# Patient Record
Sex: Female | Born: 1955 | Race: Black or African American | Hispanic: No | Smoking: Current some day smoker
Health system: Southern US, Community
[De-identification: ages and names within clinical notes are randomized; demographics above are authoritative.]

## PROBLEM LIST (undated history)

## (undated) DIAGNOSIS — H3530 Unspecified macular degeneration: Secondary | ICD-10-CM

## (undated) DIAGNOSIS — M797 Fibromyalgia: Secondary | ICD-10-CM

## (undated) DIAGNOSIS — E119 Type 2 diabetes mellitus without complications: Secondary | ICD-10-CM

## (undated) DIAGNOSIS — Z8679 Personal history of other diseases of the circulatory system: Secondary | ICD-10-CM

## (undated) DIAGNOSIS — H269 Unspecified cataract: Secondary | ICD-10-CM

## (undated) DIAGNOSIS — E785 Hyperlipidemia, unspecified: Secondary | ICD-10-CM

## (undated) DIAGNOSIS — I10 Essential (primary) hypertension: Secondary | ICD-10-CM

## (undated) HISTORY — DX: Type 2 diabetes mellitus without complications: E11.9

## (undated) HISTORY — DX: Hyperlipidemia, unspecified: E78.5

## (undated) HISTORY — DX: Personal history of other diseases of the circulatory system: Z86.79

## (undated) HISTORY — DX: Essential (primary) hypertension: I10

## (undated) HISTORY — DX: Unspecified cataract: H26.9

## (undated) HISTORY — PX: PARTIAL HYSTERECTOMY: SHX80

## (undated) HISTORY — PX: CERVICAL FUSION: SHX112

## (undated) HISTORY — DX: Fibromyalgia: M79.7

## (undated) HISTORY — PX: ECTOPIC PREGNANCY SURGERY: SHX613

## (undated) HISTORY — DX: Unspecified macular degeneration: H35.30

## (undated) HISTORY — PX: LUMBAR FUSION: SHX111

---

## 2017-07-12 DIAGNOSIS — M545 Low back pain, unspecified: Secondary | ICD-10-CM | POA: Insufficient documentation

## 2018-12-25 DIAGNOSIS — M48061 Spinal stenosis, lumbar region without neurogenic claudication: Secondary | ICD-10-CM | POA: Insufficient documentation

## 2018-12-25 DIAGNOSIS — M4310 Spondylolisthesis, site unspecified: Secondary | ICD-10-CM | POA: Insufficient documentation

## 2019-02-17 DIAGNOSIS — Z981 Arthrodesis status: Secondary | ICD-10-CM | POA: Insufficient documentation

## 2019-05-15 ENCOUNTER — Other Ambulatory Visit: Payer: Self-pay | Admitting: Internal Medicine

## 2019-05-15 DIAGNOSIS — Z1231 Encounter for screening mammogram for malignant neoplasm of breast: Secondary | ICD-10-CM

## 2019-06-04 ENCOUNTER — Ambulatory Visit: Payer: Self-pay | Admitting: Cardiology

## 2019-06-05 ENCOUNTER — Other Ambulatory Visit: Payer: Self-pay

## 2019-06-05 ENCOUNTER — Ambulatory Visit (INDEPENDENT_AMBULATORY_CARE_PROVIDER_SITE_OTHER): Payer: Medicare HMO | Admitting: Cardiology

## 2019-06-05 ENCOUNTER — Encounter: Payer: Self-pay | Admitting: Cardiology

## 2019-06-05 VITALS — BP 152/79 | HR 74 | Temp 98.0°F | Ht 67.0 in | Wt 181.0 lb

## 2019-06-05 DIAGNOSIS — E782 Mixed hyperlipidemia: Secondary | ICD-10-CM

## 2019-06-05 DIAGNOSIS — I4891 Unspecified atrial fibrillation: Secondary | ICD-10-CM

## 2019-06-05 DIAGNOSIS — Z720 Tobacco use: Secondary | ICD-10-CM

## 2019-06-05 DIAGNOSIS — I10 Essential (primary) hypertension: Secondary | ICD-10-CM

## 2019-06-05 NOTE — Progress Notes (Signed)
Primary Physician/Referring:  Sueanne Margarita, DO  Patient ID: Brittany Carroll, female    DOB: December 23, 1955, 64 y.o.   MRN: [id number]  Chief Complaint  Patient presents with  . Atrial Fibrillation  . New Patient (Initial Visit)   HPI:    Brittany Carroll  is a 64 y.o. Female patient who recently moved to [place] from [place], [place], with history of atrial fibrillation S/P Cardioversion in 2008 without recurrence, hypertension, mixed hyperlipidemia, diabetes mellitus, tobacco use, hyperthyroidism previously treated with methimazole, fibromyalgia and chronic pain referred to me to establish cardiac care in view or remote A. Fib.   Due to chronic back pain, spinal stenosis, she has not been able to exercise regularly and walks with the help of a cane.  She is chronically in pain.  No specific symptoms of dyspnea, chest pain or palpitations today.  States that she has had a stress test about 4-5 years ago and told to be normal.  Past Medical History:  Diagnosis Date  . Diabetes mellitus without complication ([place])   . Fibromyalgia   . History of atrial fibrillation without current medication    2008 in [place] had cardioversion  . Hyperlipidemia   . Hypertension    Past Surgical History:  Procedure Laterality Date  . CERVICAL FUSION    . ECTOPIC PREGNANCY SURGERY    . LUMBAR FUSION    . PARTIAL HYSTERECTOMY     Social History   Tobacco Use  . Smoking status: Current Every Day Smoker    Packs/day: 1.00    Years: 35.00    Pack years: 35.00    Types: Cigarettes  . Smokeless tobacco: Never Used  . Tobacco comment: 1 PACK EVERY 2 DAYS  Substance Use Topics  . Alcohol use: Yes   ROS  Review of Systems  Constitution: Positive for malaise/fatigue. Negative for weight gain.  Cardiovascular: Negative for dyspnea on exertion, leg swelling and syncope.  Respiratory: Negative for hemoptysis.   Endocrine: Negative for cold intolerance.  Hematologic/Lymphatic: Does not bruise/bleed easily.   Musculoskeletal: Positive for arthritis, back pain, muscle cramps and neck pain.  Gastrointestinal: Negative for hematochezia and melena.  Neurological: Negative for headaches and light-headedness.   Objective  Blood pressure (!) 152/79, pulse 74, temperature 98 F (36.7 C), height 5' 7"  (1.702 m), weight 181 lb (82.1 kg), SpO2 97 %.  Vitals with BMI 06/05/2019  Height 5' 7"   Weight 181 lbs  BMI 67.89  Systolic 381  Diastolic 79  Pulse 74     Physical Exam  Constitutional: She appears well-developed and well-nourished.  Neck: No thyromegaly present.  Cardiovascular: Normal rate, regular rhythm, normal heart sounds and intact distal pulses. Exam reveals no gallop.  No murmur heard. No leg edema, no JVD.  Pulmonary/Chest: Effort normal and breath sounds normal.  Abdominal: Soft. Bowel sounds are normal.  Musculoskeletal:     Cervical back: Neck supple.  Skin: Skin is warm and dry.   Laboratory examination:   No results for input(s): NA, K, CL, CO2, GLUCOSE, BUN, CREATININE, CALCIUM, GFRNONAA, GFRAA in the last 8760 hours. CrCl cannot be calculated (No successful lab value found.).  No flowsheet data found. No flowsheet data found. Lipid Panel  No results found for: CHOL, TRIG, HDL, CHOLHDL, VLDL, LDLCALC, LDLDIRECT HEMOGLOBIN A1C No results found for: HGBA1C, MPG TSH No results for input(s): TSH in the last 8760 hours.  Labs 05/08/2019: Serum glucose 157 mg, BUN 11, creatinine 0.8, eGFR >72 mL.  Potassium 4.8.  CMP normal.  CBC normal with minimal microcytic index.  Total cholesterol 188, triglycerides 172, HDL 57, LDL 97.  Non-HDL cholesterol 131.  TSH normal.  Vitamin D mildly reduced at 28.5.  A1c 6.1%.    Medications and allergies   Allergies  Allergen Reactions  . Lisinopril Hives     Current Outpatient Medications  Medication Instructions  . amLODipine (NORVASC) 10 MG tablet 1 tablet, Oral, Daily  . aspirin 325 mg, Oral, Daily  . cholecalciferol (VITAMIN  D3) 1,000 Units, Oral, Daily  . esomeprazole (NEXIUM) 40 mg, Oral, Daily  . gabapentin (NEURONTIN) 800 mg, Oral, 3 times daily  . Insulin Glargine (TOUJEO MAX SOLOSTAR [place]) 15 Units, Subcutaneous, Daily  . insulin lispro protamine-lispro (HUMALOG 50/50 MIX) (50-50) 100 UNIT/ML SUSP injection 5 Units, Subcutaneous, 2 times daily before meals  . losartan (COZAAR) 100 mg, Oral, Daily  . Magnesium 100 MG CAPS 2 capsules, Oral, Daily  . methimazole (TAPAZOLE) 10 mg, Oral, Daily  . morphine (MS CONTIN) 15 MG 12 hr tablet 1 tablet, Oral, 2 times daily  . Oxycodone HCl 10 mg, Oral, 4 times daily  . rosuvastatin (CRESTOR) 10 MG tablet 1 tablet, Oral, Daily  . sitaGLIPtin-metformin (JANUMET) 50-1000 MG tablet 1 tablet, Oral, 2 times daily    Radiology:  No results found.  Cardiac Studies:   None available  Assessment     ICD-10-CM   1. Atrial fibrillation, unspecified type (HCC)  I48.91 EKG 12-Lead    CANCELED: EKG 12-Lead   CHA2DS2-VASc Score is 3.  Yearly risk of stroke: 3.2% (F, HTN, DM).    2. Primary hypertension  I10   3. Mixed hyperlipidemia  E78.2   4. Tobacco use  Z72.0     EKG 06/05/2019: Normal sinus rhythm at rate of 79 bpm, normal axis.  Poor R-wave progression, cannot exclude anteroseptal infarct old, probably normal variant.  No evidence of ischemia,  Single PAC.    Recommendations:  No orders of the defined types were placed in this encounter.  Brittany Carroll  is a 64 y.o. AAF patient who recently moved to [place] from [place], [place], with history of atrial fibrillation S/P Cardioversion in 2008 without recurrence, hypertension, mixed hyperlipidemia, diabetes mellitus, tobacco use, hyperthyroidism previously treated with methimazole, fibromyalgia and chronic pain referred to me to establish cardiac care in view or remote A. Fib.   Her blood pressure slightly elevated today however patient is in extreme pain in her back and also states that it is usually well-controlled.  She  is on appropriate medical therapy with this.  I reviewed her labs, lipids are elevated and uncontrolled. Crestor dose increased recently by her PCP appropriately. Discussed regarding DASH diet and salt restriction.   Otherwise from cardiac standpoint physical examination was normal including vascular exam, EKG is also normal.  No indication ventricle ablation in view of absent documented atrial fibrillation and remote cardioversion in 2008.  I will see her back on an annual basis.I have again discussed smoking cessation.  Adrian Prows, MD, [facility] 06/05/2019, 2:11 PM [facility]. [place] Office: [phone]

## 2019-06-30 NOTE — Progress Notes (Signed)
[facility] - Clinic Note  07/13/2019     CHIEF COMPLAINT Patient presents for Retina Evaluation   HISTORY OF PRESENT ILLNESS: Brittany Carroll is a 64 y.o. female who presents to the clinic today for:   HPI    Retina Evaluation    In both eyes.  I, the attending physician,  performed the HPI with the patient and updated documentation appropriately.          Comments    Patient here for retina evaluation. Referred by Dr. Thornell Mule. Patient states vision doing ok. Gets a lot of dry eyes. Uses Restasis BID OU. Sometimes gets a stabbing pain in eye. Not all the time. Only on occasion.       Last edited by Rennis Chris, MD on 07/13/2019  1:49 PM. (History)    pt states she moved here from [place], [place] in November and her PCP has sent her here for a DM exam, pt states she has had eye exams in the past, but no problems with her eyes except DES for which she uses Restasis, pt states she also had high blood pressure and takes medication for thyroid  Referring physician: Charlane Ferretti, DO [address] [place],  [place] [address]  HISTORICAL INFORMATION:   Selected notes from the MEDICAL RECORD NUMBER Referred by PCP for DM exam   CURRENT MEDICATIONS: No current outpatient medications on file. (Ophthalmic Drugs)   No current facility-administered medications for this visit. (Ophthalmic Drugs)   Current Outpatient Medications (Other)  Medication Sig  . amLODipine (NORVASC) 10 MG tablet Take 1 tablet by mouth daily.  Marland Kitchen aspirin 325 MG tablet Take 325 mg by mouth daily.  . cholecalciferol (VITAMIN D3) 25 MCG (1000 UT) tablet Take 1,000 Units by mouth daily.  Marland Kitchen esomeprazole (NEXIUM) 40 MG capsule Take 40 mg by mouth daily at 12 noon.  . gabapentin (NEURONTIN) 800 MG tablet Take 800 mg by mouth 3 (three) times daily.  . Insulin Glargine (TOUJEO MAX SOLOSTAR [place]) Inject 15 Units into the skin daily.  . insulin lispro protamine-lispro (HUMALOG 50/50 MIX) (50-50) 100  UNIT/ML SUSP injection Inject 5 Units into the skin 2 (two) times daily before a meal.  . losartan (COZAAR) 100 MG tablet Take 100 mg by mouth daily.  . Magnesium 100 MG CAPS Take 2 capsules by mouth daily.  . methimazole (TAPAZOLE) 10 MG tablet Take 10 mg by mouth daily.  Marland Kitchen morphine (MS CONTIN) 15 MG 12 hr tablet Take 1 tablet by mouth 2 (two) times daily.  . Oxycodone HCl 10 MG TABS Take 10 mg by mouth 4 (four) times daily.  . rosuvastatin (CRESTOR) 10 MG tablet Take 1 tablet by mouth daily.  . sitaGLIPtin-metformin (JANUMET) 50-1000 MG tablet Take 1 tablet by mouth 2 (two) times daily.   No current facility-administered medications for this visit. (Other)      REVIEW OF SYSTEMS: ROS    Positive for: Endocrine, Cardiovascular   Last edited by Laddie Aquas, COA on 07/13/2019  1:48 PM. (History)       ALLERGIES Allergies  Allergen Reactions  . Lisinopril Hives    PAST MEDICAL HISTORY Past Medical History:  Diagnosis Date  . Diabetes mellitus without complication (HCC)   . Fibromyalgia   . History of atrial fibrillation without current medication    2008 in [place] had cardioversion  . Hyperlipidemia   . Hypertension    Past Surgical History:  Procedure Laterality Date  .  CERVICAL FUSION    . ECTOPIC PREGNANCY SURGERY    . LUMBAR FUSION    . PARTIAL HYSTERECTOMY      FAMILY HISTORY Family History  Problem Relation Age of Onset  . Atrial fibrillation Mother   . Hypertension Mother   . Hyperlipidemia Mother   . Leukemia Father   . Diabetes Brother   . Hypertension Brother   . Hyperlipidemia Brother   . Diabetes Brother   . Hyperlipidemia Brother   . Hypertension Brother     SOCIAL HISTORY Social History   Tobacco Use  . Smoking status: Current Every Day Smoker    Packs/day: 1.00    Years: 35.00    Pack years: 35.00    Types: Cigarettes  . Smokeless tobacco: Never Used  . Tobacco comment: 1 PACK EVERY 2 DAYS  Substance Use Topics  . Alcohol use: Yes   . Drug use: Not on file         OPHTHALMIC EXAM:  Base Eye Exam    Visual Acuity (Snellen - Linear)      Right Left   Dist [place] 20/20 -2 20/20 -2  Usually wears cl. Forgot them today.        Tonometry (Tonopen, 2:04 PM)      Right Left   Pressure 12 10       Pupils      Dark Light Shape React APD   Right 4 3 Round Brisk None   Left 4 3 Round Brisk None       Visual Fields (Counting fingers)      Left Right    Full Full       Extraocular Movement      Right Left    Full, Ortho Full, Ortho       Neuro/Psych    Oriented x3: Yes   Mood/Affect: Normal        Slit Lamp and Fundus Exam    Slit Lamp Exam      Right Left   Lids/Lashes Dermatochalasis - upper lid Dermatochalasis - upper lid   Conjunctiva/Sclera Mild Melanosis, white and clear Mild Melanosis, white and clear   Cornea Arcus, 2-3+ Punctate epithelial erosions Arcus, 2-3+ Punctate epithelial erosions   Anterior Chamber Deep and quiet Deep and quiet   Iris Round and dilated, No NVI Round and dilated, No NVI   Lens 2+ Nuclear sclerosis, 2-3+ Cortical cataract 2-3+ Nuclear sclerosis, 2-3+ Cortical cataract   Vitreous Mild Vitreous syneresis Mild Vitreous syneresis       Fundus Exam      Right Left   Disc Pink and Sharp Pink and Sharp   C/D Ratio 0.3 0.3   Macula Flat, Good foveal reflex, mild Retinal pigment epithelial mottling, No heme or edema Flat, Good foveal reflex, mild Retinal pigment epithelial mottling and clumping, No heme or edema   Vessels Mild Vascular attenuation, mild Tortuousity Mild Vascular attenuation, mild Tortuousity   Periphery Attached    Attached           Refraction    Manifest Refraction      Sphere Cylinder Dist VA   Right [place] Sphere 20/20   Left -0.75 Sphere 20/20          IMAGING AND PROCEDURES  Imaging and Procedures for @TODAY @  OCT, Retina - OU - Both Eyes       Right Eye Quality was good. Central Foveal Thickness: 235. Progression has no prior data.  Findings include normal foveal contour,  no IRF, no SRF.   Left Eye Quality was good. Central Foveal Thickness: 235. Progression has no prior data. Findings include normal foveal contour, no IRF, no SRF.   Notes *Images captured and stored on drive  Diagnosis / Impression:  NFP, no IRF/SRF OU No DME OU  Clinical management:  See below  Abbreviations: NFP - Normal foveal profile. CME - cystoid macular edema. PED - pigment epithelial detachment. IRF - intraretinal fluid. SRF - subretinal fluid. EZ - ellipsoid zone. ERM - epiretinal membrane. ORA - outer retinal atrophy. ORT - outer retinal tubulation. SRHM - subretinal hyper-reflective material                 ASSESSMENT/PLAN:    ICD-10-CM   1. Diabetes mellitus type 2 without retinopathy (HCC)  E11.9   2. Retinal edema  H35.81 OCT, Retina - OU - Both Eyes  3. Essential hypertension  I10   4. Hypertensive retinopathy of both eyes  H35.033   5. Combined forms of age-related cataract of both eyes  H25.813     1,2. 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    - The need for close monitoring of blood glucose, blood pressure, and serum lipids, avoiding cigarette or any type of tobacco, and the need for long term follow up was also discussed with patient.  - f/u in 1 year, sooner prn  3,4. Hypertensive retinopathy OU  - discussed importance of tight BP control  - monitor  5. 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  - discussed diagnosis and progression  - not yet visually significant  - monitor for now  - pt to establish primary eye care with Optometry or Ophthalmology -- offered to help w/ referral as needed   Ophthalmic Meds Ordered this visit:  No orders of the defined types were  placed in this encounter.      Return in about 1 year (around 07/12/2020) for d/u DM exam, DFE, OCT.  There are no Patient Instructions on file for this visit.   Explained the diagnoses, plan, and follow up with the patient and they expressed understanding.  Patient expressed understanding of the importance of proper follow up care.   This document serves as a record of services personally performed by Karie Chimera, MD, PhD. It was created on their behalf by Herby Abraham, COA, a certified ophthalmic assistant. The creation of this record is the provider's dictation and/or activities during the visit.    Electronically signed by: Herby Abraham, COA @TODAY @ 4:41 PM   This document serves as a record of services personally performed by , MD, PhD. It was created on their behalf by Karie Chimera, OA, an ophthalmic assistant. The creation of this record is the provider's dictation and/or activities during the visit.    Electronically signed by: Laurian Brim, OA 02.15.2021 4:41 PM    02.17.2021, M.D., Ph.D. Diseases & Surgery of the Retina and Vitreous [facility]  I have reviewed the above documentation for accuracy and completeness, and I agree with the above. [facility], M.D., Ph.D. 07/13/19 4:41 PM    Abbreviations: M myopia (nearsighted); A astigmatism; H hyperopia (farsighted); P presbyopia; Mrx spectacle prescription;  CTL contact lenses; OD right eye; OS left eye;  OU both eyes  XT exotropia; ET esotropia; PEK punctate epithelial keratitis; PEE punctate epithelial erosions; DES dry eye syndrome; MGD meibomian gland dysfunction; ATs artificial tears; PFAT's preservative free artificial tears; [place] nuclear sclerotic cataract; PSC posterior subcapsular cataract; ERM epi-retinal membrane; PVD posterior vitreous detachment; RD retinal detachment; DM diabetes mellitus; DR diabetic retinopathy; NPDR non-proliferative diabetic retinopathy; PDR  proliferative diabetic retinopathy; CSME clinically significant macular edema; DME diabetic macular edema; dbh dot blot hemorrhages; CWS cotton wool spot; POAG primary open angle glaucoma; C/D cup-to-disc ratio; HVF humphrey visual field; GVF goldmann visual field; OCT optical coherence tomography; IOP intraocular pressure; BRVO Branch retinal vein occlusion; CRVO central retinal vein occlusion; CRAO central retinal artery occlusion; BRAO branch retinal artery occlusion; RT retinal tear; SB scleral buckle; PPV pars plana vitrectomy; VH Vitreous hemorrhage; PRP panretinal laser photocoagulation; IVK intravitreal kenalog; VMT vitreomacular traction; MH Macular hole;  NVD neovascularization of the disc; NVE neovascularization elsewhere; AREDS age related eye disease study; ARMD age related macular degeneration; POAG primary open angle glaucoma; EBMD epithelial/anterior basement membrane dystrophy; ACIOL anterior chamber intraocular lens; IOL intraocular lens; PCIOL posterior chamber intraocular lens; Phaco/IOL phacoemulsification with intraocular lens placement; [place] photorefractive keratectomy; LASIK laser assisted in situ keratomileusis; HTN hypertension; DM diabetes mellitus; COPD chronic obstructive pulmonary disease

## 2019-07-02 ENCOUNTER — Other Ambulatory Visit: Payer: Self-pay | Admitting: Family Medicine

## 2019-07-02 ENCOUNTER — Other Ambulatory Visit: Payer: Self-pay

## 2019-07-02 ENCOUNTER — Ambulatory Visit
Admission: RE | Admit: 2019-07-02 | Discharge: 2019-07-02 | Disposition: A | Discharge status: Home / Self Care | Payer: PRIVATE HEALTH INSURANCE | Source: Ambulatory Visit | Attending: Internal Medicine | Admitting: Internal Medicine

## 2019-07-02 DIAGNOSIS — Z1231 Encounter for screening mammogram for malignant neoplasm of breast: Secondary | ICD-10-CM

## 2019-07-07 ENCOUNTER — Other Ambulatory Visit: Payer: Self-pay | Admitting: Family Medicine

## 2019-07-07 DIAGNOSIS — R928 Other abnormal and inconclusive findings on diagnostic imaging of breast: Secondary | ICD-10-CM

## 2019-07-13 ENCOUNTER — Encounter (INDEPENDENT_AMBULATORY_CARE_PROVIDER_SITE_OTHER): Payer: Self-pay | Admitting: Ophthalmology

## 2019-07-13 ENCOUNTER — Other Ambulatory Visit: Payer: Self-pay

## 2019-07-13 ENCOUNTER — Ambulatory Visit (INDEPENDENT_AMBULATORY_CARE_PROVIDER_SITE_OTHER): Payer: Medicare HMO | Admitting: Ophthalmology

## 2019-07-13 DIAGNOSIS — H3581 Retinal edema: Secondary | ICD-10-CM

## 2019-07-13 DIAGNOSIS — I10 Essential (primary) hypertension: Secondary | ICD-10-CM

## 2019-07-13 DIAGNOSIS — E119 Type 2 diabetes mellitus without complications: Secondary | ICD-10-CM | POA: Diagnosis not present

## 2019-07-13 DIAGNOSIS — H35033 Hypertensive retinopathy, bilateral: Secondary | ICD-10-CM | POA: Diagnosis not present

## 2019-07-13 DIAGNOSIS — H25813 Combined forms of age-related cataract, bilateral: Secondary | ICD-10-CM

## 2019-07-21 ENCOUNTER — Other Ambulatory Visit: Payer: PRIVATE HEALTH INSURANCE

## 2019-07-22 ENCOUNTER — Ambulatory Visit
Admission: RE | Admit: 2019-07-22 | Discharge: 2019-07-22 | Disposition: A | Discharge status: Home / Self Care | Payer: Medicare HMO | Source: Ambulatory Visit | Attending: Family Medicine | Admitting: Family Medicine

## 2019-07-22 ENCOUNTER — Ambulatory Visit
Admission: RE | Admit: 2019-07-22 | Discharge: 2019-07-22 | Disposition: A | Discharge status: Home / Self Care | Payer: PRIVATE HEALTH INSURANCE | Source: Ambulatory Visit | Attending: Family Medicine | Admitting: Family Medicine

## 2019-07-22 ENCOUNTER — Other Ambulatory Visit: Payer: Self-pay

## 2019-07-22 ENCOUNTER — Other Ambulatory Visit: Payer: Self-pay | Admitting: Family Medicine

## 2019-07-22 DIAGNOSIS — N6310 Unspecified lump in the right breast, unspecified quadrant: Secondary | ICD-10-CM

## 2019-07-22 DIAGNOSIS — R928 Other abnormal and inconclusive findings on diagnostic imaging of breast: Secondary | ICD-10-CM

## 2019-08-28 ENCOUNTER — Other Ambulatory Visit: Payer: Self-pay | Admitting: Family Medicine

## 2019-08-28 DIAGNOSIS — R202 Paresthesia of skin: Secondary | ICD-10-CM

## 2019-09-18 ENCOUNTER — Other Ambulatory Visit: Payer: Self-pay | Admitting: Family Medicine

## 2019-09-18 DIAGNOSIS — N6310 Unspecified lump in the right breast, unspecified quadrant: Secondary | ICD-10-CM

## 2019-09-26 ENCOUNTER — Other Ambulatory Visit: Payer: PRIVATE HEALTH INSURANCE

## 2019-10-23 ENCOUNTER — Other Ambulatory Visit: Payer: PRIVATE HEALTH INSURANCE

## 2019-11-25 ENCOUNTER — Ambulatory Visit
Admission: RE | Admit: 2019-11-25 | Discharge: 2019-11-25 | Disposition: A | Discharge status: Home / Self Care | Payer: Medicare HMO | Source: Ambulatory Visit | Attending: Family Medicine | Admitting: Family Medicine

## 2019-11-25 ENCOUNTER — Other Ambulatory Visit: Payer: Self-pay

## 2019-11-25 DIAGNOSIS — R202 Paresthesia of skin: Secondary | ICD-10-CM

## 2020-01-20 ENCOUNTER — Other Ambulatory Visit: Payer: Self-pay | Admitting: Family Medicine

## 2020-01-20 ENCOUNTER — Other Ambulatory Visit: Payer: Self-pay

## 2020-01-20 ENCOUNTER — Ambulatory Visit
Admission: RE | Admit: 2020-01-20 | Discharge: 2020-01-20 | Disposition: A | Discharge status: Home / Self Care | Payer: PRIVATE HEALTH INSURANCE | Source: Ambulatory Visit | Attending: Family Medicine | Admitting: Family Medicine

## 2020-01-20 DIAGNOSIS — N6310 Unspecified lump in the right breast, unspecified quadrant: Secondary | ICD-10-CM

## 2020-02-24 ENCOUNTER — Ambulatory Visit
Admission: RE | Admit: 2020-02-24 | Discharge: 2020-02-24 | Disposition: A | Discharge status: Home / Self Care | Payer: Medicare HMO | Source: Ambulatory Visit | Attending: Student | Admitting: Student

## 2020-02-24 ENCOUNTER — Other Ambulatory Visit: Payer: Self-pay | Admitting: Student

## 2020-02-24 ENCOUNTER — Other Ambulatory Visit: Payer: Self-pay

## 2020-02-24 DIAGNOSIS — F17210 Nicotine dependence, cigarettes, uncomplicated: Secondary | ICD-10-CM

## 2020-03-02 ENCOUNTER — Other Ambulatory Visit (HOSPITAL_COMMUNITY): Payer: Self-pay | Admitting: Student

## 2020-03-02 DIAGNOSIS — I10 Essential (primary) hypertension: Secondary | ICD-10-CM

## 2020-03-23 ENCOUNTER — Ambulatory Visit (HOSPITAL_COMMUNITY): Payer: Medicare HMO | Attending: Cardiovascular Disease

## 2020-03-23 ENCOUNTER — Other Ambulatory Visit: Payer: Self-pay

## 2020-03-23 DIAGNOSIS — I10 Essential (primary) hypertension: Secondary | ICD-10-CM | POA: Diagnosis not present

## 2020-03-23 LAB — ECHOCARDIOGRAM COMPLETE
Area-P 1/2: 3.87 cm2
S' Lateral: 2.2 cm

## 2020-04-05 ENCOUNTER — Other Ambulatory Visit: Payer: Self-pay | Admitting: General Practice

## 2020-04-05 DIAGNOSIS — F17210 Nicotine dependence, cigarettes, uncomplicated: Secondary | ICD-10-CM

## 2020-04-25 ENCOUNTER — Ambulatory Visit: Payer: Medicare HMO

## 2020-04-25 ENCOUNTER — Ambulatory Visit
Admission: RE | Admit: 2020-04-25 | Discharge: 2020-04-25 | Disposition: A | Discharge status: Home / Self Care | Payer: Medicare HMO | Source: Ambulatory Visit | Attending: General Practice | Admitting: General Practice

## 2020-04-25 DIAGNOSIS — F17210 Nicotine dependence, cigarettes, uncomplicated: Secondary | ICD-10-CM

## 2020-06-03 ENCOUNTER — Encounter: Payer: Self-pay | Admitting: Cardiology

## 2020-06-03 ENCOUNTER — Other Ambulatory Visit: Payer: Self-pay

## 2020-06-03 ENCOUNTER — Ambulatory Visit: Payer: Medicare HMO | Admitting: Cardiology

## 2020-06-03 VITALS — BP 150/94 | HR 100 | Resp 16 | Ht 67.0 in | Wt 211.8 lb

## 2020-06-03 DIAGNOSIS — Z720 Tobacco use: Secondary | ICD-10-CM

## 2020-06-03 DIAGNOSIS — I10 Essential (primary) hypertension: Secondary | ICD-10-CM

## 2020-06-03 MED ORDER — METOPROLOL SUCCINATE ER 50 MG PO TB24: 50.0000 mg | ORAL_TABLET | Freq: Every evening | ORAL | 2 refills | Status: DC

## 2020-06-03 NOTE — Progress Notes (Signed)
 Primary Physician/Referring:  Patel, Nilay, DO  Patient ID: Brittany Carroll, female    DOB: 11/12/1955, 64 y.o.   MRN: [id number]  Chief Complaint  Patient presents with  . Atrial Fibrillation    H/O  . Hypertension  . Follow-up    1 year   HPI:    Brittany Carroll  is a 64 y.o. Female patient who recently moved to [place] from [place], [place], with history of atrial fibrillation S/P Cardioversion in 2008 without recurrence, hypertension, mixed hyperlipidemia, diabetes mellitus, tobacco use, hyperthyroidism previously treated with methimazole, fibromyalgia and chronic pain who I had seen 12 months ago for consultation regarding remote A. Fib.   Her blood pressure was slightly elevated on her last office visit due to severe back pain.  Due to chronic back pain, spinal stenosis, she has not been able to exercise regularly and walks with the help of a cane.  She is chronically in pain.  Hence had not made any changes to her medications.  As there was no documented recurrence of A. fib, as she has not been on anticoagulation all this years, I did not make any recommendations to restart anticoagulation.  No specific symptoms of dyspnea, chest pain or palpitations today.    Past Medical History:  Diagnosis Date  . Diabetes mellitus without complication (HCC)   . Fibromyalgia   . History of atrial fibrillation without current medication    2008 in [place] had cardioversion  . Hyperlipidemia   . Hypertension    Past Surgical History:  Procedure Laterality Date  . CERVICAL FUSION    . ECTOPIC PREGNANCY SURGERY    . LUMBAR FUSION    . PARTIAL HYSTERECTOMY     Social History   Tobacco Use  . Smoking status: Current Every Day Smoker    Packs/day: 1.00    Years: 35.00    Pack years: 35.00    Types: Cigarettes  . Smokeless tobacco: Never Used  . Tobacco comment: 1 PACK EVERY 2 DAYS  Substance Use Topics  . Alcohol use: Yes    Comment: occasional   ROS  Review of Systems   Constitutional: Positive for malaise/fatigue. Negative for weight gain.  Cardiovascular: Negative for dyspnea on exertion, leg swelling and syncope.  Respiratory: Negative for hemoptysis.   Endocrine: Negative for cold intolerance.  Hematologic/Lymphatic: Does not bruise/bleed easily.  Musculoskeletal: Positive for arthritis, back pain, muscle cramps and neck pain.  Gastrointestinal: Negative for hematochezia and melena.  Neurological: Negative for headaches and light-headedness.   Objective  Blood pressure (!) 150/94, pulse 100, resp. rate 16, height 5' 7" (1.702 m), weight 211 lb 12.8 oz (96.1 kg), SpO2 97 %.  Vitals with BMI 06/03/2020 06/05/2019  Height 5' 7" 5' 7"  Weight 211 lbs 13 oz 181 lbs  BMI 33.16 28.34  Systolic 150 152  Diastolic 94 79  Pulse 100 74     Physical Exam Constitutional:      Appearance: She is well-developed.  Neck:     Thyroid: No thyromegaly.  Cardiovascular:     Rate and Rhythm: Regular rhythm. Tachycardia present.     Pulses: Intact distal pulses.     Heart sounds: Normal heart sounds. No murmur heard. No gallop.      Comments: No leg edema, no JVD. Pulmonary:     Effort: Pulmonary effort is normal.     Breath sounds: Normal breath sounds.  Abdominal:     General: Bowel sounds are normal.     Palpations: Abdomen   is soft.  Musculoskeletal:     Cervical back: Neck supple.  Skin:    General: Skin is warm and dry.    Laboratory examination:    Labs 05/08/2019: Serum glucose 157 mg, BUN 11, creatinine 0.8, eGFR >72 mL.  Potassium 4.8.  CMP normal.  CBC normal with minimal microcytic index.  Total cholesterol 188, triglycerides 172, HDL 57, LDL 97.  Non-HDL cholesterol 131.  TSH normal.  Vitamin D mildly reduced at 28.5.  A1c 6.1%.    Medications and allergies   Allergies  Allergen Reactions  . Lisinopril Hives    Current Outpatient Medications on File Prior to Visit  Medication Sig Dispense Refill  . amLODipine (NORVASC) 10 MG tablet  Take 1 tablet by mouth daily.    Marland Kitchen aspirin 325 MG tablet Take 325 mg by mouth daily.    . cholecalciferol (VITAMIN D3) 25 MCG (1000 UT) tablet Take 1,000 Units by mouth daily.    Marland Kitchen esomeprazole (NEXIUM) 40 MG capsule Take 40 mg by mouth daily at 12 noon.    . gabapentin (NEURONTIN) 800 MG tablet Take 800 mg by mouth 3 (three) times daily.    . Insulin Glargine (TOUJEO MAX SOLOSTAR [place]) Inject 15 Units into the skin daily.    . insulin lispro protamine-lispro (HUMALOG 50/50 MIX) (50-50) 100 UNIT/ML SUSP injection Inject 5 Units into the skin 2 (two) times daily before a meal.    . losartan (COZAAR) 100 MG tablet Take 100 mg by mouth daily.    . Magnesium 100 MG CAPS Take 2 capsules by mouth daily.    . methimazole (TAPAZOLE) 10 MG tablet Take 10 mg by mouth daily.    Marland Kitchen morphine (MS CONTIN) 15 MG 12 hr tablet Take 1 tablet by mouth 2 (two) times daily.    Marland Kitchen oxyCODONE (ROXICODONE) 15 MG immediate release tablet Take 10 mg by mouth 4 (four) times daily.    . rosuvastatin (CRESTOR) 10 MG tablet Take 1 tablet by mouth daily.    . sitaGLIPtin-metformin (JANUMET) 50-1000 MG tablet Take 1 tablet by mouth 2 (two) times daily.     No current facility-administered medications on file prior to visit.     Radiology:  No results found.  Cardiac Studies:   None available  EKG:  EKG 06/03/2020: Sinus tachycardia at rate of 103 bpm, left atrial enlargement, otherwise normal EKG.   EKG 06/05/2019: Normal sinus rhythm at rate of 79 bpm, normal axis.  Poor R-wave progression, cannot exclude anteroseptal infarct old, probably normal variant.  No evidence of ischemia,  Single PAC.   Assessment     ICD-10-CM   1. Primary hypertension  I10 EKG 12-Lead    metoprolol succinate (TOPROL-XL) 50 MG 24 hr tablet  2. Tobacco use  Z72.0        Recommendations:    Brittany Carroll  is a 65 y.o. [facility] patient who recently moved to [place] from [place], [place], with history of atrial fibrillation S/P Cardioversion in  2008 without recurrence, hypertension, mixed hyperlipidemia, diabetes mellitus, tobacco use, hyperthyroidism previously treated with methimazole, fibromyalgia and chronic pain who I had seen 12 months ago in view of remote A. Fib.   Physical exam, EKG was normal, had not made any changes to her medication.  No indication for anticoagulation in view of absent documented episodes since 2008, I have not performed any cardiac testing as she remains asymptomatic.     Again I noticed that her blood pressure is elevated.  She also  has underlying sinus tachycardia.  Also in view of prior history of atrial fibrillation, a beta-blocker would be very appropriate, I started her on metoprolol succinate 50 mg in the evening.  She will continue to closely follow-up with Dr. Posey Pronto, her PCP for further management of hypertension, hyperlipidemia and diabetes mellitus.  I will see her back on a as needed basis.  She is still smoking, again discussed smoking cessation.   Adrian Prows, MD, [facility] 06/03/2020, 2:07 PM Office: [phone] Pager: [phone]

## 2020-07-04 NOTE — Progress Notes (Shared)
[facility] - Clinic Note  07/12/2020     CHIEF COMPLAINT Patient presents for No chief complaint on file.   HISTORY OF PRESENT ILLNESS: Brittany Carroll is a 65 y.o. female who presents to the clinic today for:     Referring physician: Vladimir Crofts, FNP [facility] [address] [place],  [place] [address]  HISTORICAL INFORMATION:   Selected notes from the MEDICAL RECORD NUMBER Referred by PCP for DM exam   CURRENT MEDICATIONS: No current outpatient medications on file. (Ophthalmic Drugs)   No current facility-administered medications for this visit. (Ophthalmic Drugs)   Current Outpatient Medications (Other)  Medication Sig  . amLODipine (NORVASC) 10 MG tablet Take 1 tablet by mouth daily.  Marland Kitchen aspirin 325 MG tablet Take 325 mg by mouth daily.  . cholecalciferol (VITAMIN D3) 25 MCG (1000 UT) tablet Take 1,000 Units by mouth daily.  Marland Kitchen esomeprazole (NEXIUM) 40 MG capsule Take 40 mg by mouth daily at 12 noon.  . gabapentin (NEURONTIN) 800 MG tablet Take 800 mg by mouth 3 (three) times daily.  . Insulin Glargine (TOUJEO MAX SOLOSTAR [place]) Inject 15 Units into the skin daily.  . insulin lispro protamine-lispro (HUMALOG 50/50 MIX) (50-50) 100 UNIT/ML SUSP injection Inject 5 Units into the skin 2 (two) times daily before a meal.  . losartan (COZAAR) 100 MG tablet Take 100 mg by mouth daily.  . Magnesium 100 MG CAPS Take 2 capsules by mouth daily.  . methimazole (TAPAZOLE) 10 MG tablet Take 10 mg by mouth daily.  . metoprolol succinate (TOPROL-XL) 50 MG 24 hr tablet Take 1 tablet (50 mg total) by mouth every evening. Take with or immediately following a meal.  . morphine (MS CONTIN) 15 MG 12 hr tablet Take 1 tablet by mouth 2 (two) times daily.  Marland Kitchen oxyCODONE (ROXICODONE) 15 MG immediate release tablet Take 10 mg by mouth 4 (four) times daily.  . rosuvastatin (CRESTOR) 10 MG tablet Take 1 tablet by mouth daily.  . sitaGLIPtin-metformin (JANUMET) 50-1000 MG  tablet Take 1 tablet by mouth 2 (two) times daily.   No current facility-administered medications for this visit. (Other)      REVIEW OF SYSTEMS:    ALLERGIES Allergies  Allergen Reactions  . Lisinopril Hives    PAST MEDICAL HISTORY Past Medical History:  Diagnosis Date  . Diabetes mellitus without complication (HCC)   . Fibromyalgia   . History of atrial fibrillation without current medication    2008 in [place] had cardioversion  . Hyperlipidemia   . Hypertension    Past Surgical History:  Procedure Laterality Date  . CERVICAL FUSION    . ECTOPIC PREGNANCY SURGERY    . LUMBAR FUSION    . PARTIAL HYSTERECTOMY      FAMILY HISTORY Family History  Problem Relation Age of Onset  . Atrial fibrillation Mother   . Hypertension Mother   . Hyperlipidemia Mother   . Leukemia Father   . Diabetes Brother   . Hypertension Brother   . Hyperlipidemia Brother   . Diabetes Brother   . Hyperlipidemia Brother   . Hypertension Brother     SOCIAL HISTORY Social History   Tobacco Use  . Smoking status: Current Every Day Smoker    Packs/day: 1.00    Years: 35.00    Pack years: 35.00    Types: Cigarettes  . Smokeless tobacco: Never Used  . Tobacco comment: 1 PACK EVERY 2 DAYS  Vaping Use  . Vaping Use:  Never used  Substance Use Topics  . Alcohol use: Yes    Comment: occasional  . Drug use: Never         OPHTHALMIC EXAM:  Not recorded     IMAGING AND PROCEDURES  Imaging and Procedures for @TODAY @           ASSESSMENT/PLAN:  No diagnosis found.  1,2. 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    - The need for close monitoring of blood glucose, blood pressure, and serum lipids, avoiding cigarette or any type of tobacco, and the need for long term follow up was also discussed with patient.  - f/u in 1 year,  sooner prn  3,4. Hypertensive retinopathy OU  - discussed importance of tight BP control  - monitor  5. 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  - discussed diagnosis and progression  - not yet visually significant  - monitor for now  - pt to establish primary eye care with Optometry or Ophthalmology -- offered to help w/ referral as needed   Ophthalmic Meds Ordered this visit:  No orders of the defined types were placed in this encounter.      No follow-ups on file.  There are no Patient Instructions on file for this visit.   Explained the diagnoses, plan, and follow up with the patient and they expressed understanding.  Patient expressed understanding of the importance of proper follow up care.   This document serves as a record of services personally performed by , MD, PhD. It was created on their behalf by Karie Chimera, COT an ophthalmic technician. The creation of this record is the provider's dictation and/or activities during the visit.    Electronically signed by: Cristopher Estimable, COT 2.7.22 @ 9:59 AM   Abbreviations: M myopia (nearsighted); A astigmatism; H hyperopia (farsighted); P presbyopia; Mrx spectacle prescription;  CTL contact lenses; OD right eye; OS left eye; OU both eyes  XT exotropia; ET esotropia; PEK punctate epithelial keratitis; PEE punctate epithelial erosions; DES dry eye syndrome; MGD meibomian gland dysfunction; ATs artificial tears; PFAT's preservative free artificial tears; NSC nuclear sclerotic cataract; PSC posterior subcapsular cataract; ERM epi-retinal membrane; PVD posterior vitreous detachment; RD retinal detachment; DM diabetes mellitus; DR diabetic retinopathy; NPDR non-proliferative diabetic retinopathy; PDR proliferative diabetic retinopathy; CSME clinically significant macular edema; DME diabetic macular edema; dbh dot blot  hemorrhages; CWS cotton wool spot; POAG primary open angle glaucoma; C/D cup-to-disc ratio; HVF humphrey visual field; GVF goldmann visual field; OCT optical coherence tomography; IOP intraocular pressure; BRVO Branch retinal vein occlusion; CRVO central retinal vein occlusion; CRAO central retinal artery occlusion; BRAO branch retinal artery occlusion; RT retinal tear; SB scleral buckle; PPV pars plana vitrectomy; VH Vitreous hemorrhage; PRP panretinal laser photocoagulation; IVK intravitreal kenalog; VMT vitreomacular traction; MH Macular hole;  NVD neovascularization of the disc; NVE neovascularization elsewhere; AREDS age related eye disease study; ARMD age related macular degeneration; POAG primary open angle glaucoma; EBMD epithelial/anterior basement membrane dystrophy; ACIOL anterior chamber intraocular lens; IOL intraocular lens; PCIOL posterior chamber intraocular lens; Phaco/IOL phacoemulsification with intraocular lens placement; PRK photorefractive keratectomy; LASIK laser assisted in situ keratomileusis; HTN hypertension; DM diabetes mellitus; COPD chronic obstructive pulmonary disease

## 2020-07-10 ENCOUNTER — Other Ambulatory Visit: Payer: Self-pay | Admitting: Cardiology

## 2020-07-10 DIAGNOSIS — I10 Essential (primary) hypertension: Secondary | ICD-10-CM

## 2020-07-12 ENCOUNTER — Other Ambulatory Visit: Payer: Self-pay | Admitting: Orthopaedic Surgery

## 2020-07-12 ENCOUNTER — Telehealth: Payer: Self-pay

## 2020-07-12 ENCOUNTER — Encounter (INDEPENDENT_AMBULATORY_CARE_PROVIDER_SITE_OTHER): Payer: Medicare HMO | Admitting: Ophthalmology

## 2020-07-12 DIAGNOSIS — H3581 Retinal edema: Secondary | ICD-10-CM

## 2020-07-12 DIAGNOSIS — H35033 Hypertensive retinopathy, bilateral: Secondary | ICD-10-CM

## 2020-07-12 DIAGNOSIS — I10 Essential (primary) hypertension: Secondary | ICD-10-CM

## 2020-07-12 DIAGNOSIS — E119 Type 2 diabetes mellitus without complications: Secondary | ICD-10-CM

## 2020-07-12 DIAGNOSIS — M4802 Spinal stenosis, cervical region: Secondary | ICD-10-CM

## 2020-07-12 DIAGNOSIS — M47894 Other spondylosis, thoracic region: Secondary | ICD-10-CM

## 2020-07-12 DIAGNOSIS — H25813 Combined forms of age-related cataract, bilateral: Secondary | ICD-10-CM

## 2020-07-12 DIAGNOSIS — M47816 Spondylosis without myelopathy or radiculopathy, lumbar region: Secondary | ICD-10-CM

## 2020-07-12 NOTE — Telephone Encounter (Signed)
Phone call to patient to verify medication list and allergies for myelogram procedure. Medications pt is currently taking are safe to continue to take. Advised pt if any new medications are started prior to procedure to call and make us aware. Pt also instructed to have a driver the day of the procedure, she would be with us around 2 hours the day of, and discharge instructions reviewed. Pt verbalized understanding. 

## 2020-07-14 NOTE — Progress Notes (Shared)
[facility] - Clinic Note  07/18/2020     CHIEF COMPLAINT Patient presents for No chief complaint on file.   HISTORY OF PRESENT ILLNESS: Brittany Carroll is a 65 y.o. female who presents to the clinic today for:   pt states she moved here from [place], [place] in November and her PCP has sent her here for a DM exam, pt states she has had eye exams in the past, but no problems with her eyes except DES for which she uses Restasis, pt states she also had high blood pressure and takes medication for thyroid  Referring physician: Vladimir Crofts, FNP [facility] [address] [place],  [place] [address]  HISTORICAL INFORMATION:   Selected notes from the MEDICAL RECORD NUMBER Referred by PCP for DM exam   CURRENT MEDICATIONS: No current outpatient medications on file. (Ophthalmic Drugs)   No current facility-administered medications for this visit. (Ophthalmic Drugs)   Current Outpatient Medications (Other)  Medication Sig  . amLODipine (NORVASC) 10 MG tablet Take 1 tablet by mouth daily. (Patient not taking: Reported on 07/12/2020)  . aspirin 325 MG tablet Take 325 mg by mouth daily.  . cholecalciferol (VITAMIN D3) 25 MCG (1000 UT) tablet Take 1,000 Units by mouth daily.  Marland Kitchen esomeprazole (NEXIUM) 40 MG capsule Take 40 mg by mouth daily at 12 noon.  . gabapentin (NEURONTIN) 800 MG tablet Take 800 mg by mouth 3 (three) times daily.  . Insulin Glargine (TOUJEO MAX SOLOSTAR [place]) Inject 15 Units into the skin daily.  . insulin lispro protamine-lispro (HUMALOG 50/50 MIX) (50-50) 100 UNIT/ML SUSP injection Inject 5 Units into the skin 2 (two) times daily before a meal.  . losartan (COZAAR) 100 MG tablet Take 100 mg by mouth daily.  . Magnesium 100 MG CAPS Take 2 capsules by mouth daily.  . methimazole (TAPAZOLE) 10 MG tablet Take 10 mg by mouth daily.  . metoprolol succinate (TOPROL-XL) 50 MG 24 hr tablet TAKE 1 TABLET (50 MG TOTAL) BY MOUTH EVERY EVENING. TAKE WITH OR  IMMEDIATELY FOLLOWING A MEAL.  Marland Kitchen morphine (MS CONTIN) 15 MG 12 hr tablet Take 1 tablet by mouth 2 (two) times daily.  Marland Kitchen oxyCODONE (ROXICODONE) 15 MG immediate release tablet Take 10 mg by mouth 4 (four) times daily.  . rosuvastatin (CRESTOR) 10 MG tablet Take 1 tablet by mouth daily.  . sitaGLIPtin-metformin (JANUMET) 50-1000 MG tablet Take 1 tablet by mouth 2 (two) times daily.   No current facility-administered medications for this visit. (Other)      REVIEW OF SYSTEMS:    ALLERGIES Allergies  Allergen Reactions  . Lisinopril Hives  . Metformin And Related Hives    PAST MEDICAL HISTORY Past Medical History:  Diagnosis Date  . Diabetes mellitus without complication (HCC)   . Fibromyalgia   . History of atrial fibrillation without current medication    2008 in [place] had cardioversion  . Hyperlipidemia   . Hypertension    Past Surgical History:  Procedure Laterality Date  . CERVICAL FUSION    . ECTOPIC PREGNANCY SURGERY    . LUMBAR FUSION    . PARTIAL HYSTERECTOMY      FAMILY HISTORY Family History  Problem Relation Age of Onset  . Atrial fibrillation Mother   . Hypertension Mother   . Hyperlipidemia Mother   . Leukemia Father   . Diabetes Brother   . Hypertension Brother   . Hyperlipidemia Brother   . Diabetes Brother   . Hyperlipidemia Brother   .  Hypertension Brother     SOCIAL HISTORY Social History   Tobacco Use  . Smoking status: Current Every Day Smoker    Packs/day: 1.00    Years: 35.00    Pack years: 35.00    Types: Cigarettes  . Smokeless tobacco: Never Used  . Tobacco comment: 1 PACK EVERY 2 DAYS  Vaping Use  . Vaping Use: Never used  Substance Use Topics  . Alcohol use: Yes    Comment: occasional  . Drug use: Never         OPHTHALMIC EXAM:  Not recorded     IMAGING AND PROCEDURES  Imaging and Procedures for @TODAY @           ASSESSMENT/PLAN:    ICD-10-CM   1. Diabetes mellitus type 2 without retinopathy (HCC)   E11.9   2. Retinal edema  H35.81   3. Essential hypertension  I10   4. Hypertensive retinopathy of both eyes  H35.033   5. Combined forms of age-related cataract of both eyes  H25.813     1,2. 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    - The need for close monitoring of blood glucose, blood pressure, and serum lipids, avoiding cigarette or any type of tobacco, and the need for long term follow up was also discussed with patient.  - f/u in 1 year, sooner prn  3,4. Hypertensive retinopathy OU  - discussed importance of tight BP control  - monitor  5. 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  - discussed diagnosis and progression  - not yet visually significant  - monitor for now  - pt to establish primary eye care with Optometry or Ophthalmology -- offered to help w/ referral as needed   Ophthalmic Meds Ordered this visit:  No orders of the defined types were placed in this encounter.      No follow-ups on file.  There are no Patient Instructions on file for this visit.  This document serves as a record of services personally performed by , MD, PhD. It was created on their behalf by Karie Chimera, COA, an ophthalmic technician. The creation of this record is the provider's dictation and/or activities during the visit.    Electronically signed by: Herby Abraham, COA @TODAY @ 2:41 PM   Abbreviations: M myopia (nearsighted); A astigmatism; H hyperopia (farsighted); P presbyopia; Mrx spectacle prescription;  CTL contact lenses; OD right eye; OS left eye; OU both eyes  XT exotropia; ET esotropia; PEK punctate epithelial keratitis; PEE punctate epithelial erosions; DES dry eye syndrome; MGD meibomian gland dysfunction; ATs  artificial tears; PFAT's preservative free artificial tears; NSC nuclear sclerotic cataract; PSC posterior subcapsular cataract; ERM epi-retinal membrane; PVD posterior vitreous detachment; RD retinal detachment; DM diabetes mellitus; DR diabetic retinopathy; NPDR non-proliferative diabetic retinopathy; PDR proliferative diabetic retinopathy; CSME clinically significant macular edema; DME diabetic macular edema; dbh dot blot hemorrhages; CWS cotton wool spot; POAG primary open angle glaucoma; C/D cup-to-disc ratio; HVF humphrey visual field; GVF goldmann visual field; OCT optical coherence tomography; IOP intraocular pressure; BRVO Branch retinal vein occlusion; CRVO central retinal vein occlusion; CRAO central retinal artery occlusion; BRAO branch retinal artery occlusion; RT retinal tear; SB scleral buckle; PPV pars plana vitrectomy; VH Vitreous hemorrhage; PRP panretinal laser photocoagulation; IVK intravitreal kenalog; VMT vitreomacular traction;  MH Macular hole;  NVD neovascularization of the disc; NVE neovascularization elsewhere; AREDS age related eye disease study; ARMD age related macular degeneration; POAG primary open angle glaucoma; EBMD epithelial/anterior basement membrane dystrophy; ACIOL anterior chamber intraocular lens; IOL intraocular lens; PCIOL posterior chamber intraocular lens; Phaco/IOL phacoemulsification with intraocular lens placement; [place] photorefractive keratectomy; LASIK laser assisted in situ keratomileusis; HTN hypertension; DM diabetes mellitus; COPD chronic obstructive pulmonary disease

## 2020-07-18 ENCOUNTER — Encounter (INDEPENDENT_AMBULATORY_CARE_PROVIDER_SITE_OTHER): Payer: Medicare HMO | Admitting: Ophthalmology

## 2020-07-18 DIAGNOSIS — H35033 Hypertensive retinopathy, bilateral: Secondary | ICD-10-CM

## 2020-07-18 DIAGNOSIS — H3581 Retinal edema: Secondary | ICD-10-CM

## 2020-07-18 DIAGNOSIS — E119 Type 2 diabetes mellitus without complications: Secondary | ICD-10-CM

## 2020-07-18 DIAGNOSIS — I10 Essential (primary) hypertension: Secondary | ICD-10-CM

## 2020-07-18 DIAGNOSIS — H25813 Combined forms of age-related cataract, bilateral: Secondary | ICD-10-CM

## 2020-07-19 NOTE — Progress Notes (Signed)
[facility] - Clinic Note  07/21/2020     CHIEF COMPLAINT Patient presents for Diabetic Eye Exam   HISTORY OF PRESENT ILLNESS: Brittany Carroll is a 65 y.o. female who presents to the clinic today for:   HPI    Diabetic Eye Exam    Vision is stable.  Associated Symptoms Flashes and Floaters.  Diabetes characteristics include Type 2, controlled with diet, on insulin and taking oral medications.  This started 15 years ago.  Blood sugar level is controlled.  Last Blood Glucose: Last night.  Last A1C: 2 wks ago.  I, the attending physician,  performed the HPI with the patient and updated documentation appropriately.          Comments    65 y/o female pt here for 1 yr f/u for type II DM w/o retinopathy.  VA OD may be slightly worse.  Pt aware she has cataracts.  OD has been sore for a few wks.  Pt has several small floaters OU.  Pt has been seeing intermittent temporal FOL OD.  Restasis BID OU.       Last edited by Rennis Chris, MD on 07/21/2020 12:45 PM. (History)    pt states vision is stable, she states she does she floaters and an occasional fol OD, she states her last A1c was 6 something, she is still on oral medication and 2 insulins, she is using Restasis for dry eyes  Referring physician: Vladimir Crofts, FNP [facility] [address] [place],  [place] [address]  HISTORICAL INFORMATION:   Selected notes from the MEDICAL RECORD NUMBER Referred by PCP for DM exam   CURRENT MEDICATIONS: No current outpatient medications on file. (Ophthalmic Drugs)   No current facility-administered medications for this visit. (Ophthalmic Drugs)   Current Outpatient Medications (Other)  Medication Sig   amLODipine (NORVASC) 10 MG tablet Take 1 tablet by mouth daily. (Patient not taking: Reported on 07/12/2020)   aspirin 325 MG tablet Take 325 mg by mouth daily.   cholecalciferol (VITAMIN D3) 25 MCG (1000 UT) tablet Take 1,000 Units by mouth daily.   esomeprazole  (NEXIUM) 40 MG capsule Take 40 mg by mouth daily at 12 noon.   gabapentin (NEURONTIN) 800 MG tablet Take 800 mg by mouth 3 (three) times daily.   Insulin Glargine (TOUJEO MAX SOLOSTAR [place]) Inject 15 Units into the skin daily.   insulin lispro protamine-lispro (HUMALOG 50/50 MIX) (50-50) 100 UNIT/ML SUSP injection Inject 5 Units into the skin 2 (two) times daily before a meal.   losartan (COZAAR) 100 MG tablet Take 100 mg by mouth daily.   Magnesium 100 MG CAPS Take 2 capsules by mouth daily.   methimazole (TAPAZOLE) 10 MG tablet Take 10 mg by mouth daily.   metoprolol succinate (TOPROL-XL) 50 MG 24 hr tablet TAKE 1 TABLET (50 MG TOTAL) BY MOUTH EVERY EVENING. TAKE WITH OR IMMEDIATELY FOLLOWING A MEAL.   morphine (MS CONTIN) 15 MG 12 hr tablet Take 1 tablet by mouth 2 (two) times daily.   oxyCODONE (ROXICODONE) 15 MG immediate release tablet Take 10 mg by mouth 4 (four) times daily.   rosuvastatin (CRESTOR) 10 MG tablet Take 1 tablet by mouth daily.   sitaGLIPtin-metformin (JANUMET) 50-1000 MG tablet Take 1 tablet by mouth 2 (two) times daily.   No current facility-administered medications for this visit. (Other)      REVIEW OF SYSTEMS: ROS    Positive for: Eyes   Negative for: Constitutional, Gastrointestinal, Neurological,  Skin, Genitourinary, Musculoskeletal, HENT, Endocrine, Cardiovascular, Respiratory, Psychiatric, Allergic/Imm, Heme/Lymph   Last edited by Celine MansBaxley, Andrew G, COA on 07/21/2020 10:17 AM. (History)       ALLERGIES Allergies  Allergen Reactions   Lisinopril Hives   Metformin And Related Hives    PAST MEDICAL HISTORY Past Medical History:  Diagnosis Date   Cataract    OU   Diabetes mellitus without complication (HCC)    Fibromyalgia    History of atrial fibrillation without current medication    2008 in [place] had cardioversion   Hyperlipidemia    Hypertension    Past Surgical History:  Procedure Laterality Date   CERVICAL FUSION      ECTOPIC PREGNANCY SURGERY     LUMBAR FUSION     PARTIAL HYSTERECTOMY      FAMILY HISTORY Family History  Problem Relation Age of Onset   Atrial fibrillation Mother    Hypertension Mother    Hyperlipidemia Mother    Leukemia Father    Diabetes Brother    Hypertension Brother    Hyperlipidemia Brother    Diabetes Brother    Hyperlipidemia Brother    Hypertension Brother     SOCIAL HISTORY Social History   Tobacco Use   Smoking status: Current Every Day Smoker    Packs/day: 1.00    Years: 35.00    Pack years: 35.00    Types: Cigarettes   Smokeless tobacco: Never Used   Tobacco comment: 1 PACK EVERY 2 DAYS  Vaping Use   Vaping Use: Never used  Substance Use Topics   Alcohol use: Yes    Comment: occasional   Drug use: Never         OPHTHALMIC EXAM:  Base Eye Exam    Visual Acuity (Snellen - Linear)      Right Left   Dist cc 20/40 - 20/20 -2   Dist ph cc 20/25 +2    Correction: Glasses       Tonometry (Tonopen, 10:18 AM)      Right Left   Pressure 11 11       Pupils      Dark Light Shape React APD   Right 4 3 Round Brisk None   Left 4 3 Round Brisk None       Visual Fields (Counting fingers)      Left Right    Full Full       Extraocular Movement      Right Left    Full, Ortho Full, Ortho       Neuro/Psych    Oriented x3: Yes   Mood/Affect: Normal       Dilation    Both eyes: 1.0% Mydriacyl, 2.5% Phenylephrine @ 10:18 AM        Slit Lamp and Fundus Exam    Slit Lamp Exam      Right Left   Lids/Lashes Dermatochalasis - upper lid Dermatochalasis - upper lid   Conjunctiva/Sclera Mild Melanosis, inferior Conjunctivochalasis Mild Melanosis, inferior Conjunctivochalasis   Cornea Arcus, 1+ Punctate epithelial erosions, tear film debris, trace EBMD Arcus, trace Punctate epithelial erosions, trace tear film debris   Anterior Chamber Deep and quiet Deep and quiet   Iris Round and dilated, No NVI Round and dilated, No NVI    Lens 2+ Nuclear sclerosis, 2+ Cortical cataract 2+ Nuclear sclerosis, 2-3+ Cortical cataract   Vitreous Mild Vitreous syneresis Mild Vitreous syneresis       Fundus Exam      Right Left  Disc Pink and Sharp Pink and Sharp, mild temporal PPA   C/D Ratio 0.3 0.3   Macula Flat, Good foveal reflex, mild Retinal pigment epithelial mottling, No heme or edema Flat, Good foveal reflex, mild Retinal pigment epithelial mottling and clumping, No heme or edema   Vessels mild attenuation, mild tortuousity attenuated, mild tortuousity   Periphery Attached, No heme  Attached, No heme           IMAGING AND PROCEDURES  Imaging and Procedures for @TODAY @  OCT, Retina - OU - Both Eyes       Right Eye Quality was good. Central Foveal Thickness: 233. Progression has been stable. Findings include normal foveal contour, no IRF, no SRF.   Left Eye Quality was good. Central Foveal Thickness: 229. Progression has been stable. Findings include normal foveal contour, no IRF, no SRF.   Notes *Images captured and stored on drive  Diagnosis / Impression:  NFP, no IRF/SRF OU No DME OU  Clinical management:  See below  Abbreviations: NFP - Normal foveal profile. CME - cystoid macular edema. PED - pigment epithelial detachment. IRF - intraretinal fluid. SRF - subretinal fluid. EZ - ellipsoid zone. ERM - epiretinal membrane. ORA - outer retinal atrophy. ORT - outer retinal tubulation. SRHM - subretinal hyper-reflective material                 ASSESSMENT/PLAN:    ICD-10-CM   1. Diabetes mellitus type 2 without retinopathy (HCC)  E11.9   2. Retinal edema  H35.81 OCT, Retina - OU - Both Eyes  3. Essential hypertension  I10   4. Hypertensive retinopathy of both eyes  H35.033   5. Combined forms of age-related cataract of both eyes  H25.813     1,2. Diabetes mellitus, type 2 without retinopathy  - A1c "6-something" per pt report  - The incidence, risk factors for progression, natural history  and treatment options for diabetic retinopathy  were discussed with patient.    - The need for close monitoring of blood glucose, blood pressure, and serum lipids, avoiding cigarette or any type of tobacco, and the need for long term follow up was also discussed with patient.  - f/u in 1 year, sooner prn  3,4. Hypertensive retinopathy OU  - discussed importance of tight BP control  - monitor  5. 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  - discussed diagnosis and progression  - not yet visually significant  - monitor for now  - pt has not yet established primary eye care with Optometry or Ophthalmology -- offered to help w/ referral as needed -- plans to talk to insurance carrier to find provider   Ophthalmic Meds Ordered this visit:  No orders of the defined types were placed in this encounter.      Return in about 1 year (around 07/21/2021) for f/u DM exam, DFE, OCT.  There are no Patient Instructions on file for this visit.   Explained the diagnoses, plan, and follow up with the patient and they expressed understanding.  Patient expressed understanding of the importance of proper follow up care.   This document serves as a record of services personally performed by 07/23/2021, MD, PhD. It was created on their behalf by Karie Chimera, an ophthalmic technician. The creation of this record is the provider's dictation and/or activities during the visit.    Electronically signed by: Joni Reining COA,  07/21/20  12:56 PM  This document serves as a record of services personally performed by Karie Chimera, MD, PhD. It was created on their behalf by Glee Arvin. Manson Passey, OA an ophthalmic technician. The creation of this record is the provider's dictation and/or activities during the visit.    Electronically signed by: Glee Arvin. Manson Passey, [place] 02.24.2022 12:56 PM   Karie Chimera, M.D.,  Ph.D. Diseases & Surgery of the Retina and Vitreous [facility] 07/21/20  I have reviewed the above documentation for accuracy and completeness, and I agree with the above. Karie Chimera, M.D., Ph.D. 07/21/20 12:56 PM    Abbreviations: M myopia (nearsighted); A astigmatism; H hyperopia (farsighted); P presbyopia; Mrx spectacle prescription;  CTL contact lenses; OD right eye; OS left eye; OU both eyes  XT exotropia; ET esotropia; PEK punctate epithelial keratitis; PEE punctate epithelial erosions; DES dry eye syndrome; MGD meibomian gland dysfunction; ATs artificial tears; PFAT's preservative free artificial tears; NSC nuclear sclerotic cataract; PSC posterior subcapsular cataract; ERM epi-retinal membrane; PVD posterior vitreous detachment; RD retinal detachment; DM diabetes mellitus; DR diabetic retinopathy; NPDR non-proliferative diabetic retinopathy; PDR proliferative diabetic retinopathy; CSME clinically significant macular edema; DME diabetic macular edema; dbh dot blot hemorrhages; CWS cotton wool spot; POAG primary open angle glaucoma; C/D cup-to-disc ratio; HVF humphrey visual field; GVF goldmann visual field; OCT optical coherence tomography; IOP intraocular pressure; BRVO Branch retinal vein occlusion; CRVO central retinal vein occlusion; CRAO central retinal artery occlusion; BRAO branch retinal artery occlusion; RT retinal tear; SB scleral buckle; PPV pars plana vitrectomy; VH Vitreous hemorrhage; PRP panretinal laser photocoagulation; IVK intravitreal kenalog; VMT vitreomacular traction; MH Macular hole;  NVD neovascularization of the disc; NVE neovascularization elsewhere; AREDS age related eye disease study; ARMD age related macular degeneration; POAG primary open angle glaucoma; EBMD epithelial/anterior basement membrane dystrophy; ACIOL anterior chamber intraocular lens; IOL intraocular lens; PCIOL posterior chamber intraocular lens; Phaco/IOL phacoemulsification  with intraocular lens placement; PRK photorefractive keratectomy; LASIK laser assisted in situ keratomileusis; HTN hypertension; DM diabetes mellitus; COPD chronic obstructive pulmonary disease

## 2020-07-20 ENCOUNTER — Ambulatory Visit
Admission: RE | Admit: 2020-07-20 | Discharge: 2020-07-20 | Disposition: A | Discharge status: Home / Self Care | Payer: Medicare HMO | Source: Ambulatory Visit | Attending: Orthopaedic Surgery | Admitting: Orthopaedic Surgery

## 2020-07-20 DIAGNOSIS — M4802 Spinal stenosis, cervical region: Secondary | ICD-10-CM

## 2020-07-20 DIAGNOSIS — M47894 Other spondylosis, thoracic region: Secondary | ICD-10-CM

## 2020-07-20 DIAGNOSIS — M47816 Spondylosis without myelopathy or radiculopathy, lumbar region: Secondary | ICD-10-CM

## 2020-07-20 MED ORDER — IOPAMIDOL (ISOVUE-M 300) INJECTION 61%
10.0000 mL | Freq: Once | INTRAMUSCULAR | Status: AC | PRN
  Administered 2020-07-20: 10 mL via INTRATHECAL

## 2020-07-20 MED ORDER — DIAZEPAM 5 MG PO TABS
10.0000 mg | ORAL_TABLET | Freq: Once | ORAL | Status: AC
  Administered 2020-07-20: 10 mg via ORAL

## 2020-07-20 MED ORDER — OXYCODONE-ACETAMINOPHEN 5-325 MG PO TABS
1.0000 | ORAL_TABLET | Freq: Once | ORAL | Status: AC
  Administered 2020-07-20: 1 via ORAL

## 2020-07-20 NOTE — Progress Notes (Signed)
1120 c/o's severe low back pain.  Ice pack provided.  Tolerating crackers and juice well. 1130 1 percocet given.  See [facility]

## 2020-07-20 NOTE — Discharge Instructions (Signed)

## 2020-07-21 ENCOUNTER — Ambulatory Visit (INDEPENDENT_AMBULATORY_CARE_PROVIDER_SITE_OTHER): Payer: Medicare HMO | Admitting: Ophthalmology

## 2020-07-21 ENCOUNTER — Encounter (INDEPENDENT_AMBULATORY_CARE_PROVIDER_SITE_OTHER): Payer: Self-pay | Admitting: Ophthalmology

## 2020-07-21 ENCOUNTER — Other Ambulatory Visit: Payer: Self-pay

## 2020-07-21 DIAGNOSIS — H25813 Combined forms of age-related cataract, bilateral: Secondary | ICD-10-CM

## 2020-07-21 DIAGNOSIS — H35033 Hypertensive retinopathy, bilateral: Secondary | ICD-10-CM

## 2020-07-21 DIAGNOSIS — H3581 Retinal edema: Secondary | ICD-10-CM | POA: Diagnosis not present

## 2020-07-21 DIAGNOSIS — E119 Type 2 diabetes mellitus without complications: Secondary | ICD-10-CM

## 2020-07-21 DIAGNOSIS — I10 Essential (primary) hypertension: Secondary | ICD-10-CM

## 2020-07-25 ENCOUNTER — Ambulatory Visit
Admission: RE | Admit: 2020-07-25 | Discharge: 2020-07-25 | Disposition: A | Discharge status: Home / Self Care | Payer: Medicare HMO | Source: Ambulatory Visit | Attending: Family Medicine | Admitting: Family Medicine

## 2020-07-25 ENCOUNTER — Other Ambulatory Visit: Payer: Self-pay

## 2020-07-25 DIAGNOSIS — N6310 Unspecified lump in the right breast, unspecified quadrant: Secondary | ICD-10-CM

## 2020-08-20 ENCOUNTER — Other Ambulatory Visit: Payer: Self-pay | Admitting: Cardiology

## 2020-08-20 DIAGNOSIS — I10 Essential (primary) hypertension: Secondary | ICD-10-CM

## 2020-11-15 ENCOUNTER — Other Ambulatory Visit: Payer: Self-pay | Admitting: Cardiology

## 2020-11-15 DIAGNOSIS — I10 Essential (primary) hypertension: Secondary | ICD-10-CM

## 2020-12-20 ENCOUNTER — Ambulatory Visit: Payer: Medicare Other | Admitting: Neurology

## 2020-12-22 ENCOUNTER — Ambulatory Visit: Payer: Medicare Other | Admitting: Neurology

## 2021-01-14 NOTE — Progress Notes (Deleted)
Primary Physician/Referring:  [name], MD  Patient ID: Brittany Carroll, female    DOB: [birth year], 65 y.o.   MRN: [id number]  No chief complaint on file.  HPI:    Brittany Carroll  is a 65 y.o. Female patient with history of atrial fibrillation S/P Cardioversion in 2008 without recurrence, hypertension, mixed hyperlipidemia, diabetes mellitus, tobacco use, hyperthyroidism previously treated with methimazole, fibromyalgia and chronic pain.  Patient was seen in 2021 for consultation regarding remote atrial fibrillation.   Patient was last seen by Dr. Einar Gip 06/03/2020 at which time she was started on metoprolol succinate 50 mg daily and recommended for as needed follow-up.  However she is now referred back to our office by PCP for atrial fibrillation.  At last office visit patient had had no documented recurrence of A. fib, therefore held off on anticoagulation.***  ***   Who I had seen 12 months ago for consultation regarding remote A. Fib.   Her blood pressure was slightly elevated on her last office visit due to severe back pain.  Due to chronic back pain, spinal stenosis, she has not been able to exercise regularly and walks with the help of a cane.  She is chronically in pain.  Hence had not made any changes to her medications.  As there was no documented recurrence of A. fib, as she has not been on anticoagulation all this years, I did not make any recommendations to restart anticoagulation.  No specific symptoms of dyspnea, chest pain or palpitations today.    Past Medical History:  Diagnosis Date   Cataract    OU   Diabetes mellitus without complication ([place])    Fibromyalgia    History of atrial fibrillation without current medication    2008 in [place] had cardioversion   Hyperlipidemia    Hypertension    Past Surgical History:  Procedure Laterality Date   CERVICAL FUSION     ECTOPIC PREGNANCY SURGERY     LUMBAR FUSION     PARTIAL HYSTERECTOMY     Family History   Problem Relation Age of Onset   Atrial fibrillation Mother    Hypertension Mother    Hyperlipidemia Mother    Leukemia Father    Diabetes Brother    Hypertension Brother    Hyperlipidemia Brother    Diabetes Brother    Hyperlipidemia Brother    Hypertension Brother    Social History   Tobacco Use   Smoking status: Every Day    Packs/day: 1.00    Years: 35.00    Pack years: 35.00    Types: Cigarettes   Smokeless tobacco: Never   Tobacco comments:    1 PACK EVERY 2 DAYS  Substance Use Topics   Alcohol use: Yes    Comment: occasional   ROS  Review of Systems  Constitutional: Positive for malaise/fatigue. Negative for weight gain.  Cardiovascular:  Negative for dyspnea on exertion, leg swelling and syncope.  Respiratory:  Negative for hemoptysis.   Endocrine: Negative for cold intolerance.  Hematologic/Lymphatic: Does not bruise/bleed easily.  Musculoskeletal:  Positive for arthritis, back pain, muscle cramps and neck pain.  Gastrointestinal:  Negative for hematochezia and melena.  Neurological:  Negative for headaches and light-headedness.  Objective  There were no vitals taken for this visit.  Vitals with BMI 07/20/2020 07/20/2020 06/03/2020  Height - - 5' 7"   Weight - - 211 lbs 13 oz  BMI - - 95.28  Systolic 413 244 010  Diastolic 81 60 94  Pulse 72 91 100     Physical Exam Constitutional:      Appearance: She is well-developed.  Neck:     Thyroid: No thyromegaly.  Cardiovascular:     Rate and Rhythm: Regular rhythm. Tachycardia present.     Pulses: Intact distal pulses.     Heart sounds: Normal heart sounds. No murmur heard.   No gallop.     Comments: No leg edema, no JVD. Pulmonary:     Effort: Pulmonary effort is normal.     Breath sounds: Normal breath sounds.  Abdominal:     General: Bowel sounds are normal.     Palpations: Abdomen is soft.  Musculoskeletal:     Cervical back: Neck supple.  Skin:    General: Skin is warm and dry.   Laboratory  examination:  No flowsheet data found. No flowsheet data found. Lipid Panel  No results found for: CHOL, TRIG, HDL, CHOLHDL, VLDL, LDLCALC, LDLDIRECT HEMOGLOBIN A1C No results found for: HGBA1C, MPG TSH No results for input(s): TSH in the last 8760 hours.   Labs 05/08/2019: Serum glucose 157 mg, BUN 11, creatinine 0.8, eGFR >72 mL.  Potassium 4.8.  CMP normal.  CBC normal with minimal microcytic index.  Total cholesterol 188, triglycerides 172, HDL 57, LDL 97.  Non-HDL cholesterol 131.  TSH normal.  Vitamin D mildly reduced at 28.5.  A1c 6.1%.   Allergies   Allergies  Allergen Reactions   Lisinopril Hives   Metformin And Related Hives      Medications Prior to Visit:   Outpatient Medications Prior to Visit  Medication Sig Dispense Refill   amLODipine (NORVASC) 10 MG tablet Take 1 tablet by mouth daily. (Patient not taking: Reported on 07/12/2020)     aspirin 325 MG tablet Take 325 mg by mouth daily.     cholecalciferol (VITAMIN D3) 25 MCG (1000 UT) tablet Take 1,000 Units by mouth daily.     esomeprazole (NEXIUM) 40 MG capsule Take 40 mg by mouth daily at 12 noon.     gabapentin (NEURONTIN) 800 MG tablet Take 800 mg by mouth 3 (three) times daily.     Insulin Glargine (TOUJEO MAX SOLOSTAR [place]) Inject 15 Units into the skin daily.     insulin lispro protamine-lispro (HUMALOG 50/50 MIX) (50-50) 100 UNIT/ML SUSP injection Inject 5 Units into the skin 2 (two) times daily before a meal.     losartan (COZAAR) 100 MG tablet Take 100 mg by mouth daily.     Magnesium 100 MG CAPS Take 2 capsules by mouth daily.     methimazole (TAPAZOLE) 10 MG tablet Take 10 mg by mouth daily.     metoprolol succinate (TOPROL-XL) 50 MG 24 hr tablet TAKE 1 TABLET (50 MG TOTAL) BY MOUTH EVERY EVENING. TAKE WITH OR IMMEDIATELY FOLLOWING A MEAL. 90 tablet 0   morphine (MS CONTIN) 15 MG 12 hr tablet Take 1 tablet by mouth 2 (two) times daily.     oxyCODONE (ROXICODONE) 15 MG immediate release tablet Take 10  mg by mouth 4 (four) times daily.     rosuvastatin (CRESTOR) 10 MG tablet Take 1 tablet by mouth daily.     sitaGLIPtin-metformin (JANUMET) 50-1000 MG tablet Take 1 tablet by mouth 2 (two) times daily.     No facility-administered medications prior to visit.     Final Medications at End of Visit    No outpatient medications have been marked as taking for the 01/16/21 encounter (Appointment) with Rayetta Pigg, Shamika Pedregon C, PA-C.  Radiology:  No results found.  Cardiac Studies:   Echocardiogram 03/23/2020:  1. Left ventricular ejection fraction, by estimation, is 60 to 65%. The  left ventricle has normal function. The left ventricle has no regional  wall motion abnormalities. There is mild left ventricular hypertrophy.  Left ventricular diastolic parameters  are consistent with Grade II diastolic dysfunction (pseudonormalization).   2. Right ventricular systolic function is normal. The right ventricular  size is normal.   3. The mitral valve is normal in structure. Trivial mitral valve  regurgitation. No evidence of mitral stenosis.   4. The aortic valve is normal in structure. Aortic valve regurgitation is  not visualized. No aortic stenosis is present.   5. The inferior vena cava is normal in size with greater than 50%  respiratory variability, suggesting right atrial pressure of 3 mmHg.    EKG:  EKG 06/03/2020: Sinus tachycardia at rate of 103 bpm, left atrial enlargement, otherwise normal EKG.   EKG 06/05/2019: Normal sinus rhythm at rate of 79 bpm, normal axis.  Poor R-wave progression, cannot exclude anteroseptal infarct old, probably normal variant.  No evidence of ischemia,  Single PAC.   Assessment   No diagnosis found.    No orders of the defined types were placed in this encounter. There are no discontinued medications.  This patients CHA2DS2-VASc Score 4 (HTN, DM, Age, F) and yearly risk of stroke 4.8%.   Recommendations:    Brittany Carroll  is a 65 y.o. Female  patient with history of atrial fibrillation S/P Cardioversion in 2008 without recurrence, hypertension, mixed hyperlipidemia, diabetes mellitus, tobacco use, hyperthyroidism previously treated with methimazole, fibromyalgia and chronic pain.  Patient was seen in 2021 for consultation regarding remote atrial fibrillation.   Patient was last seen by Dr. Einar Gip 06/03/2020 at which time she was started on metoprolol succinate 50 mg daily and recommended for as needed follow-up.  However she is now referred back to our office by PCP for atrial fibrillation.  At last office visit patient had had no documented recurrence of A. fib, therefore held off on anticoagulation.***  ***  ***  Physical exam, EKG was normal, had not made any changes to her medication.  No indication for anticoagulation in view of absent documented episodes since 2008, I have not performed any cardiac testing as she remains asymptomatic.     Again I noticed that her blood pressure is elevated.  She also has underlying sinus tachycardia.  Also in view of prior history of atrial fibrillation, a beta-blocker would be very appropriate, I started her on metoprolol succinate 50 mg in the evening.  She will continue to closely follow-up with Dr. Posey Pronto, her PCP for further management of hypertension, hyperlipidemia and diabetes mellitus.  I will see her back on a as needed basis.  She is still smoking, again discussed smoking cessation.   Alethia Berthold, MD, [facility] 01/14/2021, 10:58 AM Office: [phone] Pager: [phone]

## 2021-01-16 ENCOUNTER — Ambulatory Visit: Payer: Medicare HMO | Admitting: Student

## 2021-01-16 DIAGNOSIS — I4891 Unspecified atrial fibrillation: Secondary | ICD-10-CM

## 2021-01-16 DIAGNOSIS — I10 Essential (primary) hypertension: Secondary | ICD-10-CM

## 2021-01-17 ENCOUNTER — Ambulatory Visit: Payer: Medicare HMO | Admitting: Student

## 2021-01-17 ENCOUNTER — Encounter: Payer: Self-pay | Admitting: Student

## 2021-01-17 ENCOUNTER — Other Ambulatory Visit: Payer: Self-pay

## 2021-01-17 VITALS — BP 141/62 | HR 82 | Temp 98.0°F | Resp 17 | Ht 67.0 in | Wt 217.4 lb

## 2021-01-17 DIAGNOSIS — E109 Type 1 diabetes mellitus without complications: Secondary | ICD-10-CM

## 2021-01-17 DIAGNOSIS — I10 Essential (primary) hypertension: Secondary | ICD-10-CM

## 2021-01-17 DIAGNOSIS — I4891 Unspecified atrial fibrillation: Secondary | ICD-10-CM

## 2021-01-17 DIAGNOSIS — E782 Mixed hyperlipidemia: Secondary | ICD-10-CM

## 2021-01-17 MED ORDER — HYDROCHLOROTHIAZIDE 12.5 MG PO CAPS: 12.5000 mg | ORAL_CAPSULE | Freq: Every day | ORAL | 3 refills | Status: DC

## 2021-01-17 NOTE — Progress Notes (Signed)
Primary Physician/Referring:  [name], MD  Patient ID: Brittany Carroll, female    DOB: September 03, 1955, 65 y.o.   MRN: [id number]  Chief Complaint  Patient presents with   Atrial Fibrillation   Abnormal ECG   Primary hypertension   HPI:    Brittany Carroll  is a 65 y.o. Female patient with history of atrial fibrillation S/P Cardioversion in 2008 without recurrence, hypertension, mixed hyperlipidemia, diabetes mellitus, tobacco use, hyperthyroidism previously treated with methimazole, fibromyalgia and chronic pain.  Patient was seen in 2021 for consultation regarding remote atrial fibrillation.   Patient was last seen by Dr. Einar Gip 06/03/2020 at which time she was started on metoprolol succinate 50 mg daily and recommended for as needed follow-up.  However she is now referred back to our office by PCP for EKG concerning for atrial fibrillation.  At last office visit patient had had no documented recurrence of A. fib, therefore held off on anticoagulation.  Patient presents with her daughter present at bedside.  Patient is without specific complaints today.  Denies chest pain, dyspnea, syncope, near syncope, palpitations, dizziness.  Denies orthopnea, PND, leg swelling.  Her physical activity is limited due to chronic pain for which she follows with pain management and uses marijuana as well.  Patient states she checks her blood pressure 3-4 times per week at home and per patient her average is 126/80 mmHg, however she does note that it frequently spikes higher than that.  [name] last A1c was 7.0%.  Recently PCP stopped amlodipine due to concern that it caused hives.  Past Medical History:  Diagnosis Date   Cataract    OU   Diabetes mellitus without complication ([place])    Fibromyalgia    History of atrial fibrillation without current medication    2008 in [place] had cardioversion   Hyperlipidemia    Hypertension    Past Surgical History:  Procedure Laterality Date   CERVICAL FUSION      ECTOPIC PREGNANCY SURGERY     LUMBAR FUSION     PARTIAL HYSTERECTOMY     Family History  Problem Relation Age of Onset   Atrial fibrillation Mother    Hypertension Mother    Hyperlipidemia Mother    Leukemia Father    Diabetes Brother    Hypertension Brother    Hyperlipidemia Brother    Diabetes Brother    Hyperlipidemia Brother    Hypertension Brother    Social History   Tobacco Use   Smoking status: Former    Packs/day: 0.50    Years: 35.00    Pack years: 17.50    Types: Cigarettes    Quit date: 12/13/2020    Years since quitting: 0.0   Smokeless tobacco: Never   Tobacco comments:    1 PACK EVERY 2 DAYS  Substance Use Topics   Alcohol use: Yes    Comment: occasional   ROS  Review of Systems  Constitutional: Negative for malaise/fatigue and weight gain.  Cardiovascular:  Negative for chest pain, claudication, dyspnea on exertion, leg swelling, near-syncope, orthopnea, palpitations, paroxysmal nocturnal dyspnea and syncope.  Respiratory:  Negative for shortness of breath.   Hematologic/Lymphatic: Does not bruise/bleed easily.  Musculoskeletal:  Positive for arthritis, back pain, muscle cramps and neck pain.  Neurological:  Negative for dizziness.  Objective  Blood pressure (!) 141/62, pulse 82, temperature 98 F (36.7 C), temperature source Temporal, resp. rate 17, height 5' 7"  (1.702 m), weight 217 lb 6.4 oz (98.6 kg), SpO2 96 %.  Vitals with BMI 01/17/2021 01/17/2021 07/20/2020  Height - 5' 7"  -  Weight - 217 lbs 6 oz -  BMI - 41.28 -  Systolic 786 767 209  Diastolic 62 57 81  Pulse 82 83 72     Physical Exam Vitals reviewed.  Constitutional:      Appearance: She is well-developed.  HENT:     Head: Normocephalic and atraumatic.  Neck:     Thyroid: No thyromegaly.  Cardiovascular:     Rate and Rhythm: Normal rate and regular rhythm.     Pulses: Intact distal pulses.          Carotid pulses are 2+ on the right side and 2+ on the left side.      Radial  pulses are 2+ on the right side and 2+ on the left side.       Dorsalis pedis pulses are 2+ on the right side and 2+ on the left side.     Heart sounds: Normal heart sounds, S1 normal and S2 normal. No murmur heard.   No gallop.     Comments: No leg edema, no JVD. Pulmonary:     Effort: Pulmonary effort is normal. No respiratory distress.     Breath sounds: Normal breath sounds. No wheezing, rhonchi or rales.  Musculoskeletal:     Cervical back: Neck supple.     Right lower leg: No edema.     Left lower leg: No edema.  Skin:    General: Skin is warm and dry.  Neurological:     Mental Status: She is alert.   Laboratory examination:  No flowsheet data found. No flowsheet data found. Lipid Panel  No results found for: CHOL, TRIG, HDL, CHOLHDL, VLDL, LDLCALC, LDLDIRECT HEMOGLOBIN A1C No results found for: HGBA1C, MPG TSH No results for input(s): TSH in the last 8760 hours.   External labs: 11/09/2020: Hemoglobin 8.8, hematocrit 25.8, MCV 76.5, platelet 197 Magnesium 2.0 A1c 7.0% Sodium 138, potassium 4.1, BUN 19, creatinine 0.68, GFR >90  05/08/2019: Serum glucose 157 mg, BUN 11, creatinine 0.8, eGFR >72 mL.  Potassium 4.8.  CMP normal.  CBC normal with minimal microcytic index.  Total cholesterol 188, triglycerides 172, HDL 57, LDL 97.  Non-HDL cholesterol 131.  TSH normal.  Vitamin D mildly reduced at 28.5.  A1c 6.1%.   Allergies   Allergies  Allergen Reactions   Lisinopril Hives   Metformin And Related Hives      Medications Prior to Visit:   Outpatient Medications Prior to Visit  Medication Sig Dispense Refill   amitriptyline (ELAVIL) 75 MG tablet Take 75 mg by mouth at bedtime.     aspirin 325 MG tablet Take 325 mg by mouth daily.     betamethasone valerate ointment (VALISONE) 0.1 % Apply 1 application topically as needed.     cholecalciferol (VITAMIN D3) 25 MCG (1000 UT) tablet Take 1,000 Units by mouth daily.     clotrimazole-betamethasone (LOTRISONE) cream  Apply 1 application topically as needed.     cyclobenzaprine (FLEXERIL) 10 MG tablet Take 10 mg by mouth 2 (two) times daily as needed.     esomeprazole (NEXIUM) 40 MG capsule Take 40 mg by mouth daily at 12 noon.     Insulin Glargine (TOUJEO MAX SOLOSTAR [place]) Inject 15 Units into the skin daily.     insulin lispro protamine-lispro (HUMALOG 50/50 MIX) (50-50) 100 UNIT/ML SUSP injection Inject 5 Units into the skin 2 (two) times daily before a meal.  JENTADUETO 2.09-998 MG TABS Take 1 tablet by mouth 2 (two) times daily.     losartan (COZAAR) 100 MG tablet Take 100 mg by mouth daily.     Magnesium 100 MG CAPS Take 2 capsules by mouth daily.     methimazole (TAPAZOLE) 10 MG tablet Take 10 mg by mouth daily.     metoprolol succinate (TOPROL-XL) 50 MG 24 hr tablet TAKE 1 TABLET (50 MG TOTAL) BY MOUTH EVERY EVENING. TAKE WITH OR IMMEDIATELY FOLLOWING A MEAL. 90 tablet 0   morphine (MS CONTIN) 15 MG 12 hr tablet Take 1 tablet by mouth 2 (two) times daily.     mupirocin ointment (BACTROBAN) 2 % Apply 1 application topically as needed.     oxyCODONE (ROXICODONE) 15 MG immediate release tablet Take 10 mg by mouth 4 (four) times daily.     pregabalin (LYRICA) 200 MG capsule Take 200 mg by mouth 2 (two) times daily.     rosuvastatin (CRESTOR) 10 MG tablet Take 1 tablet by mouth daily.     sitaGLIPtin-metformin (JANUMET) 50-1000 MG tablet Take 1 tablet by mouth 2 (two) times daily.     triamcinolone cream (KENALOG) 0.5 % Apply 1 application topically as needed.     amLODipine (NORVASC) 10 MG tablet Take 1 tablet by mouth daily. (Patient not taking: No sig reported)     gabapentin (NEURONTIN) 800 MG tablet Take 800 mg by mouth 3 (three) times daily.     No facility-administered medications prior to visit.     Final Medications at End of Visit    Current Meds  Medication Sig   amitriptyline (ELAVIL) 75 MG tablet Take 75 mg by mouth at bedtime.   aspirin 325 MG tablet Take 325 mg by mouth daily.    betamethasone valerate ointment (VALISONE) 0.1 % Apply 1 application topically as needed.   cholecalciferol (VITAMIN D3) 25 MCG (1000 UT) tablet Take 1,000 Units by mouth daily.   clotrimazole-betamethasone (LOTRISONE) cream Apply 1 application topically as needed.   cyclobenzaprine (FLEXERIL) 10 MG tablet Take 10 mg by mouth 2 (two) times daily as needed.   esomeprazole (NEXIUM) 40 MG capsule Take 40 mg by mouth daily at 12 noon.   hydrochlorothiazide (MICROZIDE) 12.5 MG capsule Take 1 capsule (12.5 mg total) by mouth daily.   Insulin Glargine (TOUJEO MAX SOLOSTAR [place]) Inject 15 Units into the skin daily.   insulin lispro protamine-lispro (HUMALOG 50/50 MIX) (50-50) 100 UNIT/ML SUSP injection Inject 5 Units into the skin 2 (two) times daily before a meal.   JENTADUETO 2.09-998 MG TABS Take 1 tablet by mouth 2 (two) times daily.   losartan (COZAAR) 100 MG tablet Take 100 mg by mouth daily.   Magnesium 100 MG CAPS Take 2 capsules by mouth daily.   methimazole (TAPAZOLE) 10 MG tablet Take 10 mg by mouth daily.   metoprolol succinate (TOPROL-XL) 50 MG 24 hr tablet TAKE 1 TABLET (50 MG TOTAL) BY MOUTH EVERY EVENING. TAKE WITH OR IMMEDIATELY FOLLOWING A MEAL.   morphine (MS CONTIN) 15 MG 12 hr tablet Take 1 tablet by mouth 2 (two) times daily.   mupirocin ointment (BACTROBAN) 2 % Apply 1 application topically as needed.   oxyCODONE (ROXICODONE) 15 MG immediate release tablet Take 10 mg by mouth 4 (four) times daily.   pregabalin (LYRICA) 200 MG capsule Take 200 mg by mouth 2 (two) times daily.   rosuvastatin (CRESTOR) 10 MG tablet Take 1 tablet by mouth daily.   sitaGLIPtin-metformin (JANUMET) 50-1000 MG  tablet Take 1 tablet by mouth 2 (two) times daily.   triamcinolone cream (KENALOG) 0.5 % Apply 1 application topically as needed.   Radiology:  No results found.  Cardiac Studies:   Echocardiogram 03/23/2020:  1. Left ventricular ejection fraction, by estimation, is 60 to 65%. The  left  ventricle has normal function. The left ventricle has no regional  wall motion abnormalities. There is mild left ventricular hypertrophy.  Left ventricular diastolic parameters  are consistent with Grade II diastolic dysfunction (pseudonormalization).   2. Right ventricular systolic function is normal. The right ventricular  size is normal.   3. The mitral valve is normal in structure. Trivial mitral valve  regurgitation. No evidence of mitral stenosis.   4. The aortic valve is normal in structure. Aortic valve regurgitation is  not visualized. No aortic stenosis is present.   5. The inferior vena cava is normal in size with greater than 50%  respiratory variability, suggesting right atrial pressure of 3 mmHg.     EKG   01/17/2021: Sinus rhythm at a rate of 77 bpm with borderline first-degree AV block.  Left atrial enlargement.  Normal axis.  Poor R wave progression, cannot exclude anteroseptal infarct old.  IVCD.  PCP EKG 01/13/2021: Sinus tachycardia with single PAC at a rate of 109 bpm.  Normal axis.  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  Left atrial enlargement.  06/03/2020: Sinus tachycardia at rate of 103 bpm, left atrial enlargement, otherwise normal EKG.   EKG 06/05/2019: Normal sinus rhythm at rate of 79 bpm, normal axis.  Poor R-wave progression, cannot exclude anteroseptal infarct old, probably normal variant.  No evidence of ischemia,  Single PAC.   Assessment     ICD-10-CM   1. Atrial fibrillation, unspecified type ([place])  I48.91 EKG 12-Lead    2. Primary hypertension  G89 Basic metabolic panel    3. Mixed hyperlipidemia  E78.2 CANCELED: CT CARDIAC SCORING ([facility] LOCATIONS ONLY)    4. Type 1 diabetes mellitus without complication ([facility])  V69.4 CANCELED: CT CARDIAC SCORING ([facility] LOCATIONS ONLY)        Meds ordered this encounter  Medications   hydrochlorothiazide (MICROZIDE) 12.5 MG capsule    Sig: Take 1 capsule (12.5 mg total) by mouth daily.    Dispense:  30 capsule    Refill:  3    Medications Discontinued During This Encounter  Medication Reason   amLODipine (NORVASC) 10 MG tablet Error   gabapentin (NEURONTIN) 800 MG tablet Error    This patients CHA2DS2-VASc Score 4 (HTN, DM, Age, F) and yearly risk of stroke 4.8%.   Recommendations:    Brittany Carroll  is a 64 y.o. Female patient with history of atrial fibrillation S/P Cardioversion in 2008 without recurrence, hypertension, mixed hyperlipidemia, diabetes mellitus, tobacco use, hyperthyroidism previously treated with methimazole, fibromyalgia and chronic pain.  Patient was seen in 2021 for consultation regarding remote atrial fibrillation.   Patient was last seen by Dr. Einar Gip 06/03/2020 at which time she was started on metoprolol succinate 50 mg daily and recommended for as needed follow-up.  However she is now referred back to our office by PCP for atrial fibrillation.  At last office visit patient had had no documented recurrence of A. fib, therefore held off on anticoagulation.  I personally reviewed recent PCP EKG, which reveals sinus tachycardia, no evidence of atrial fibrillation.  EKG today shows normal sinus rhythm.  I personally reviewed external labs.  Patient is presently asymptomatic and still no documented recurrence of atrial fibrillation  since 2008.  We will therefore continue to hold off on anticoagulation.  [name] blood pressure is elevated in the office today, will therefore start hydrochlorothiazide 12.5 mg daily and repeat BMP in 1 week.  Also discussed with patient option of proceeding with coronary calcium score given multiple cardiovascular risk factors, however she prefers to hold off at this time given cost concerns.  No other changes to medications at this time.  Again discussed with patient regarding diet and lifestyle modifications including avoiding tobacco use as well as weight loss.  Follow-up in 8 weeks, sooner if needed, for hypertension.   Alethia Berthold, PA-C 01/17/2021, 1:01  PM Office: [phone]

## 2021-01-26 LAB — BASIC METABOLIC PANEL
BUN/Creatinine Ratio: 14 (ref 12–28)
BUN: 12 mg/dL (ref 8–27)
CO2: 23 mmol/L (ref 20–29)
Calcium: 9.2 mg/dL (ref 8.7–10.3)
Chloride: 102 mmol/L (ref 96–106)
Creatinine, Ser: 0.88 mg/dL (ref 0.57–1.00)
Glucose: 160 mg/dL — ABNORMAL HIGH (ref 65–99)
Potassium: 4.7 mmol/L (ref 3.5–5.2)
Sodium: 139 mmol/L (ref 134–144)
eGFR: 73 mL/min/{1.73_m2} (ref >59)

## 2021-01-26 NOTE — Progress Notes (Signed)
Renal function stable, continue present medications.

## 2021-01-27 NOTE — Progress Notes (Signed)
Called and spoke to pt, pt voiced understanding.

## 2021-02-06 ENCOUNTER — Telehealth: Payer: Self-pay

## 2021-02-06 NOTE — Telephone Encounter (Signed)
Patient called, stated that both her ankles and feet are swollen and itchy, and its burns when she attempts to scratch them. She wants to know if she needs to schedule an appointment to be seen for the swelling or does a medication need to be adjusted? Please advise.

## 2021-02-07 NOTE — Telephone Encounter (Signed)
Not sure it is cardiology problem. She has an appointment with [facility] on 03/15/21. Keep that

## 2021-02-08 NOTE — Telephone Encounter (Signed)
Spoke with patient, she will keep appointment with [facility].

## 2021-03-14 NOTE — Progress Notes (Signed)
Primary Physician/Referring:  [name], MD  Patient ID: [name], female    DOB: 08/12/1955, 65 y.o.   MRN: [id number]  Chief Complaint  Patient presents with   Follow-up    8 WEEKS   Hypertension   HPI:    Ariaunna Longsworth  is a 65 y.o. Female patient with history of atrial fibrillation S/P Cardioversion in 2008 without recurrence, hypertension, mixed hyperlipidemia, diabetes mellitus, tobacco use, hyperthyroidism previously treated with methimazole, fibromyalgia and chronic pain.  Patient was seen in 2021 for consultation regarding remote atrial fibrillation.   Patient presents for 8-week follow-up.  At last office visit started hydrochlorothiazide 12.5 mg daily given elevated blood pressure, repeat BMP remained stable.  Patient is tolerating hydrochlorothiazide without issue.  She is also working on quitting smoking, patient is congratulated on her efforts.  Unfortunately office blood pressure as well as home blood pressure readings remain elevated above goal.  Patient admits to high sodium intake.  Denies chest pain, dyspnea, syncope, near syncope, palpitations, dizziness.  Denies orthopnea, PND, leg swelling.    Previously PCP stopped amlodipine due to concern that it caused hives, which have resoled since stopping amlodipine.   Past Medical History:  Diagnosis Date   Cataract    OU   Diabetes mellitus without complication ([place])    Fibromyalgia    History of atrial fibrillation without current medication    2008 in [place] had cardioversion   Hyperlipidemia    Hypertension    Past Surgical History:  Procedure Laterality Date   CERVICAL FUSION     ECTOPIC PREGNANCY SURGERY     LUMBAR FUSION     PARTIAL HYSTERECTOMY     Family History  Problem Relation Age of Onset   Atrial fibrillation Mother    Hypertension Mother    Hyperlipidemia Mother    Leukemia Father    Diabetes Brother    Hypertension Brother    Hyperlipidemia Brother    Diabetes Brother     Hyperlipidemia Brother    Hypertension Brother    Social History   Tobacco Use   Smoking status: Former    Packs/day: 0.50    Years: 35.00    Pack years: 17.50    Types: Cigarettes    Quit date: 12/13/2020    Years since quitting: 0.2   Smokeless tobacco: Never   Tobacco comments:    1 PACK EVERY 2 DAYS  Substance Use Topics   Alcohol use: Yes    Comment: occasional   ROS  Review of Systems  Constitutional: Negative for malaise/fatigue and weight gain.  Cardiovascular:  Negative for chest pain, claudication, dyspnea on exertion, leg swelling, near-syncope, orthopnea, palpitations, paroxysmal nocturnal dyspnea and syncope.  Respiratory:  Negative for shortness of breath.   Hematologic/Lymphatic: Does not bruise/bleed easily.  Musculoskeletal:  Positive for arthritis, back pain, muscle cramps and neck pain.  Neurological:  Negative for dizziness.  Objective  Blood pressure (!) 142/66, pulse 84, temperature 98.3 F (36.8 C), temperature source Temporal, resp. rate 17, height _0  (1.702 m), weight 235 lb 3.2 oz (106.7 kg), SpO2 94 %.  Vitals with BMI 03/15/2021 03/15/2021 01/17/2021  Height - _1  -  Weight - 235 lbs 3 oz -  BMI - 99.24 -  Systolic 268 341 962  Diastolic 66 80 62  Pulse 84 87 82     Physical Exam Vitals reviewed.  Constitutional:      Appearance: She is well-developed.  Neck:  Thyroid: No thyromegaly.  Cardiovascular:     Rate and Rhythm: Normal rate and regular rhythm.     Pulses: Intact distal pulses.          Carotid pulses are 2+ on the right side and 2+ on the left side.      Radial pulses are 2+ on the right side and 2+ on the left side.       Dorsalis pedis pulses are 2+ on the right side and 2+ on the left side.     Heart sounds: Normal heart sounds, S1 normal and S2 normal. No murmur heard.   No gallop.     Comments: No leg edema, no JVD. Pulmonary:     Effort: Pulmonary effort is normal. No respiratory distress.     Breath sounds:  Normal breath sounds. No wheezing, rhonchi or rales.  Musculoskeletal:     Right lower leg: No edema.     Left lower leg: No edema.  Skin:    General: Skin is warm and dry.  Neurological:     Mental Status: She is alert.   Laboratory examination:   CMP Latest Ref Rng & Units 01/25/2021  Glucose 65 - 99 mg/dL 160(H)  BUN 8 - 27 mg/dL 12  Creatinine 0.57 - 1.00 mg/dL 0.88  Sodium 134 - 144 mmol/L 139  Potassium 3.5 - 5.2 mmol/L 4.7  Chloride 96 - 106 mmol/L 102  CO2 20 - 29 mmol/L 23  Calcium 8.7 - 10.3 mg/dL 9.2   No flowsheet data found. Lipid Panel  No results found for: CHOL, TRIG, HDL, CHOLHDL, VLDL, LDLCALC, LDLDIRECT HEMOGLOBIN A1C No results found for: HGBA1C, MPG TSH No results for input(s): TSH in the last 8760 hours.   External labs: 11/09/2020: Hemoglobin 8.8, hematocrit 25.8, MCV 76.5, platelet 197 Magnesium 2.0 A1c 7.0% Sodium 138, potassium 4.1, BUN 19, creatinine 0.68, GFR >90  05/08/2019: Serum glucose 157 mg, BUN 11, creatinine 0.8, eGFR >72 mL.  Potassium 4.8.  CMP normal.  CBC normal with minimal microcytic index.  Total cholesterol 188, triglycerides 172, HDL 57, LDL 97.  Non-HDL cholesterol 131.  TSH normal.  Vitamin D mildly reduced at 28.5.  A1c 6.1%.   Allergies   Allergies  Allergen Reactions   Lisinopril Hives   Metformin And Related Hives    Medications Prior to Visit:   Outpatient Medications Prior to Visit  Medication Sig Dispense Refill   amitriptyline (ELAVIL) 75 MG tablet Take 75 mg by mouth at bedtime.     ARIPiprazole (ABILIFY) 5 MG tablet Take 5 mg by mouth daily.     aspirin 325 MG tablet Take 325 mg by mouth daily.     betamethasone valerate ointment (VALISONE) 0.1 % Apply 1 application topically as needed.     cholecalciferol (VITAMIN D3) 25 MCG (1000 UT) tablet Take 1,000 Units by mouth daily.     clotrimazole-betamethasone (LOTRISONE) cream Apply 1 application topically as needed.     cyclobenzaprine (FLEXERIL) 10 MG  tablet Take 10 mg by mouth 2 (two) times daily as needed.     esomeprazole (NEXIUM) 40 MG capsule Take 40 mg by mouth as needed.     Insulin Glargine (TOUJEO MAX SOLOSTAR [place]) Inject 15 Units into the skin daily.     insulin lispro protamine-lispro (HUMALOG 50/50 MIX) (50-50) 100 UNIT/ML SUSP injection Inject 5 Units into the skin 2 (two) times daily before a meal.     JENTADUETO 2.09-998 MG TABS Take 1 tablet by mouth  2 (two) times daily.     losartan (COZAAR) 100 MG tablet Take 100 mg by mouth daily.     Magnesium 100 MG CAPS Take 2 capsules by mouth daily.     metoprolol succinate (TOPROL-XL) 50 MG 24 hr tablet TAKE 1 TABLET (50 MG TOTAL) BY MOUTH EVERY EVENING. TAKE WITH OR IMMEDIATELY FOLLOWING A MEAL. 90 tablet 0   morphine (MS CONTIN) 15 MG 12 hr tablet Take 1 tablet by mouth 2 (two) times daily.     mupirocin ointment (BACTROBAN) 2 % Apply 1 application topically as needed.     oxyCODONE (ROXICODONE) 15 MG immediate release tablet Take 20 mg by mouth 4 (four) times daily.     pregabalin (LYRICA) 200 MG capsule Take 200 mg by mouth 2 (two) times daily.     rosuvastatin (CRESTOR) 10 MG tablet Take 1 tablet by mouth daily.     sitaGLIPtin-metformin (JANUMET) 50-1000 MG tablet Take 1 tablet by mouth 2 (two) times daily.     triamcinolone cream (KENALOG) 0.5 % Apply 1 application topically as needed.     hydrochlorothiazide (MICROZIDE) 12.5 MG capsule Take 1 capsule (12.5 mg total) by mouth daily. (Patient taking differently: Take 12.5 mg by mouth as needed.) 30 capsule 3   methimazole (TAPAZOLE) 10 MG tablet Take 10 mg by mouth daily.     No facility-administered medications prior to visit.   Final Medications at End of Visit    Current Meds  Medication Sig   amitriptyline (ELAVIL) 75 MG tablet Take 75 mg by mouth at bedtime.   ARIPiprazole (ABILIFY) 5 MG tablet Take 5 mg by mouth daily.   aspirin 325 MG tablet Take 325 mg by mouth daily.   betamethasone valerate ointment (VALISONE) 0.1  % Apply 1 application topically as needed.   cholecalciferol (VITAMIN D3) 25 MCG (1000 UT) tablet Take 1,000 Units by mouth daily.   clotrimazole-betamethasone (LOTRISONE) cream Apply 1 application topically as needed.   cyclobenzaprine (FLEXERIL) 10 MG tablet Take 10 mg by mouth 2 (two) times daily as needed.   esomeprazole (NEXIUM) 40 MG capsule Take 40 mg by mouth as needed.   hydrochlorothiazide (HYDRODIURIL) 25 MG tablet Take 1 tablet (25 mg total) by mouth daily.   Insulin Glargine (TOUJEO MAX SOLOSTAR [place]) Inject 15 Units into the skin daily.   insulin lispro protamine-lispro (HUMALOG 50/50 MIX) (50-50) 100 UNIT/ML SUSP injection Inject 5 Units into the skin 2 (two) times daily before a meal.   JENTADUETO 2.09-998 MG TABS Take 1 tablet by mouth 2 (two) times daily.   losartan (COZAAR) 100 MG tablet Take 100 mg by mouth daily.   Magnesium 100 MG CAPS Take 2 capsules by mouth daily.   metoprolol succinate (TOPROL-XL) 50 MG 24 hr tablet TAKE 1 TABLET (50 MG TOTAL) BY MOUTH EVERY EVENING. TAKE WITH OR IMMEDIATELY FOLLOWING A MEAL.   morphine (MS CONTIN) 15 MG 12 hr tablet Take 1 tablet by mouth 2 (two) times daily.   mupirocin ointment (BACTROBAN) 2 % Apply 1 application topically as needed.   oxyCODONE (ROXICODONE) 15 MG immediate release tablet Take 20 mg by mouth 4 (four) times daily.   pregabalin (LYRICA) 200 MG capsule Take 200 mg by mouth 2 (two) times daily.   rosuvastatin (CRESTOR) 10 MG tablet Take 1 tablet by mouth daily.   sitaGLIPtin-metformin (JANUMET) 50-1000 MG tablet Take 1 tablet by mouth 2 (two) times daily.   triamcinolone cream (KENALOG) 0.5 % Apply 1 application topically as needed.  Radiology:  No results found.  Cardiac Studies:   Echocardiogram 03/23/2020:  1. Left ventricular ejection fraction, by estimation, is 60 to 65%. The  left ventricle has normal function. The left ventricle has no regional  wall motion abnormalities. There is mild left ventricular  hypertrophy.  Left ventricular diastolic parameters  are consistent with Grade II diastolic dysfunction (pseudonormalization).   2. Right ventricular systolic function is normal. The right ventricular  size is normal.   3. The mitral valve is normal in structure. Trivial mitral valve  regurgitation. No evidence of mitral stenosis.   4. The aortic valve is normal in structure. Aortic valve regurgitation is  not visualized. No aortic stenosis is present.   5. The inferior vena cava is normal in size with greater than 50%  respiratory variability, suggesting right atrial pressure of 3 mmHg.   EKG   01/17/2021: Sinus rhythm at a rate of 77 bpm with borderline first-degree AV block.  Left atrial enlargement.  Normal axis.  Poor R wave progression, cannot exclude anteroseptal infarct old.  IVCD.  PCP EKG 01/13/2021: Sinus tachycardia with single PAC at a rate of 109 bpm.  Normal axis.  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  Left atrial enlargement.  06/03/2020: Sinus tachycardia at rate of 103 bpm, left atrial enlargement, otherwise normal EKG.   EKG 06/05/2019: Normal sinus rhythm at rate of 79 bpm, normal axis.  Poor R-wave progression, cannot exclude anteroseptal infarct old, probably normal variant.  No evidence of ischemia,  Single PAC.   Assessment     ICD-10-CM   1. Primary hypertension  F74 Basic metabolic panel        Meds ordered this encounter  Medications   hydrochlorothiazide (HYDRODIURIL) 25 MG tablet    Sig: Take 1 tablet (25 mg total) by mouth daily.    Dispense:  90 tablet    Refill:  3   Medications Discontinued During This Encounter  Medication Reason   methimazole (TAPAZOLE) 10 MG tablet Error   hydrochlorothiazide (MICROZIDE) 12.5 MG capsule Error    This patients CHA2DS2-VASc Score 4 (HTN, DM, Age, F) and yearly risk of stroke 4.8%.   Recommendations:    Karinda Cabriales  is a 65 y.o. Female patient with history of atrial fibrillation S/P Cardioversion in 2008 without  recurrence, hypertension, mixed hyperlipidemia, diabetes mellitus, tobacco use, hyperthyroidism previously treated with methimazole, fibromyalgia and chronic pain.  Patient was seen in 2021 for consultation regarding remote atrial fibrillation.   Patient presents for 8-week follow-up.  At last office visit started hydrochlorothiazide 12.5 mg daily given elevated blood pressure, repeat BMP remained stable.  Patient is tolerating hydrochlorothiazide without issue, however her blood pressure remains uncontrolled.  Therefore shared decision was to increase hydrochlorothiazide from 12.5 to 25 mg p.o. daily.  We will repeat BMP in 1 week.  Patient will continue to monitor blood pressure on daily basis at home and notify our office if it remains >130/80 mmHg.  She is otherwise stable from a cardiovascular standpoint.  Follow-up in 3 months, sooner if needed, for hypertension, hyperlipidemia, atrial fibrillation.   Alethia Berthold, PA-C 03/15/2021, 12:02 PM Office: [phone]

## 2021-03-15 ENCOUNTER — Encounter: Payer: Self-pay | Admitting: Student

## 2021-03-15 ENCOUNTER — Other Ambulatory Visit: Payer: Self-pay

## 2021-03-15 ENCOUNTER — Ambulatory Visit: Payer: Medicare Other | Admitting: Student

## 2021-03-15 VITALS — BP 142/66 | HR 84 | Temp 98.3°F | Resp 17 | Ht 67.0 in | Wt 235.2 lb

## 2021-03-15 DIAGNOSIS — I10 Essential (primary) hypertension: Secondary | ICD-10-CM

## 2021-03-15 MED ORDER — HYDROCHLOROTHIAZIDE 25 MG PO TABS: 25.0000 mg | ORAL_TABLET | Freq: Every day | ORAL | 3 refills | Status: DC

## 2021-03-15 NOTE — Patient Instructions (Signed)
Follow up in

## 2021-03-22 ENCOUNTER — Other Ambulatory Visit: Payer: Self-pay

## 2021-03-22 MED ORDER — LOSARTAN POTASSIUM 100 MG PO TABS
100.0000 mg | ORAL_TABLET | Freq: Every day | ORAL | 0 refills | Status: DC
Start: 2021-03-22 — End: 2022-01-18

## 2021-04-02 ENCOUNTER — Other Ambulatory Visit: Payer: Self-pay | Admitting: Student

## 2021-04-02 ENCOUNTER — Other Ambulatory Visit: Payer: Self-pay | Admitting: Cardiology

## 2021-04-02 DIAGNOSIS — I10 Essential (primary) hypertension: Secondary | ICD-10-CM

## 2021-06-08 IMAGING — MG DIGITAL SCREENING BILAT W/ TOMO W/ CAD
8 series · 8 of 24 positions shown · non-contrast
Comparison: None

CLINICAL DATA: Screening.

EXAM:
DIGITAL SCREENING BILATERAL MAMMOGRAM WITH TOMO AND CAD

[L CC synth-2D]
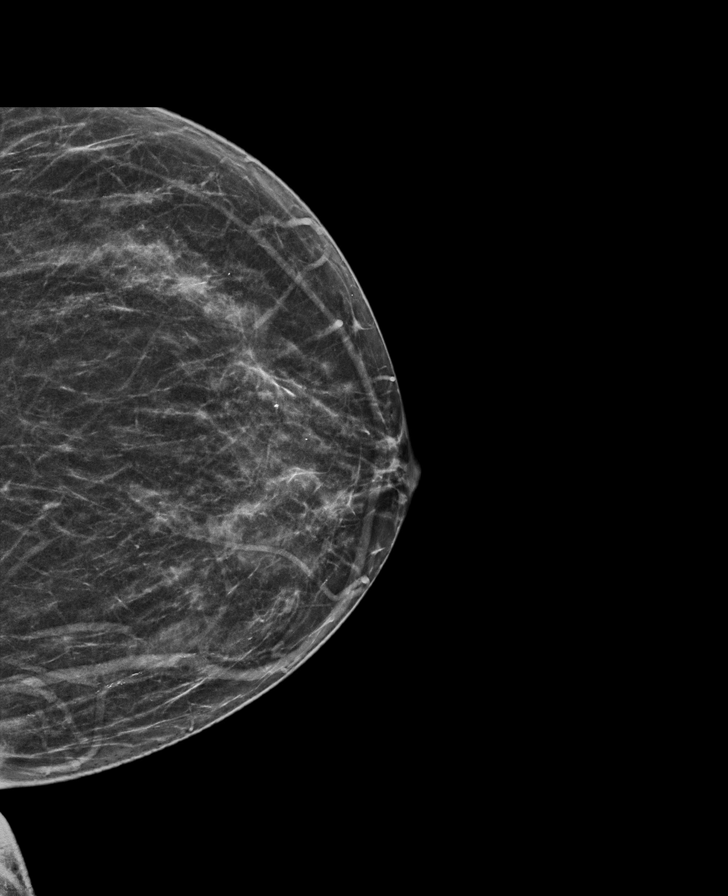

[R CC synth-2D]
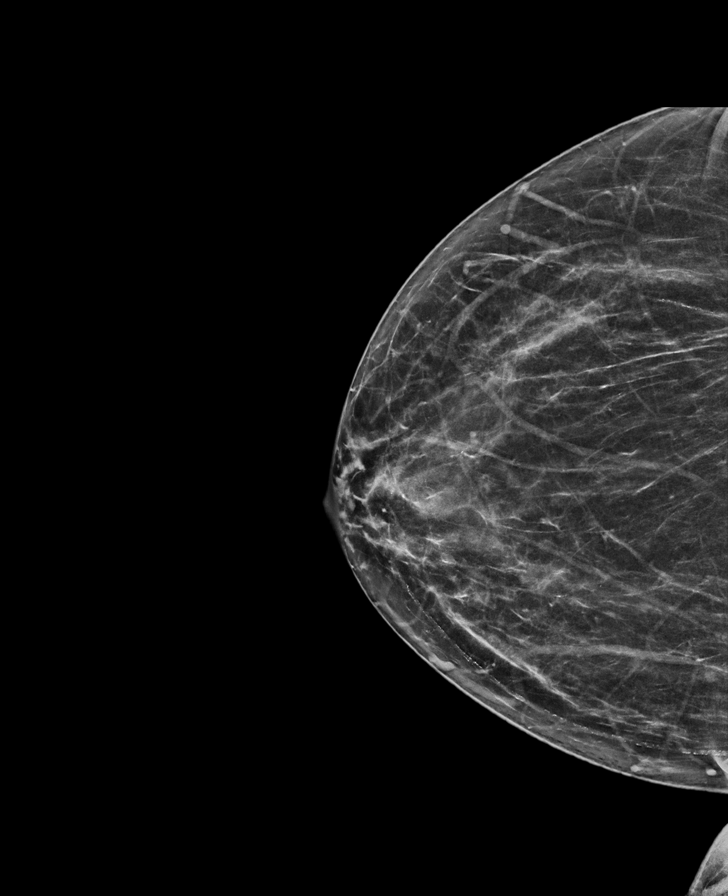

[R MLO synth-2D]
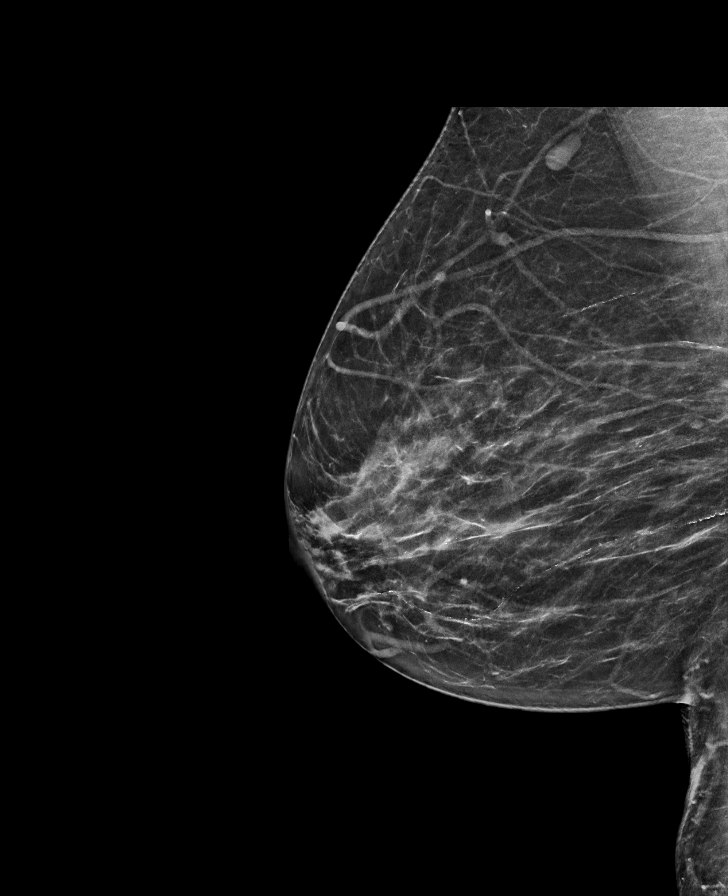

[L MLO synth-2D]
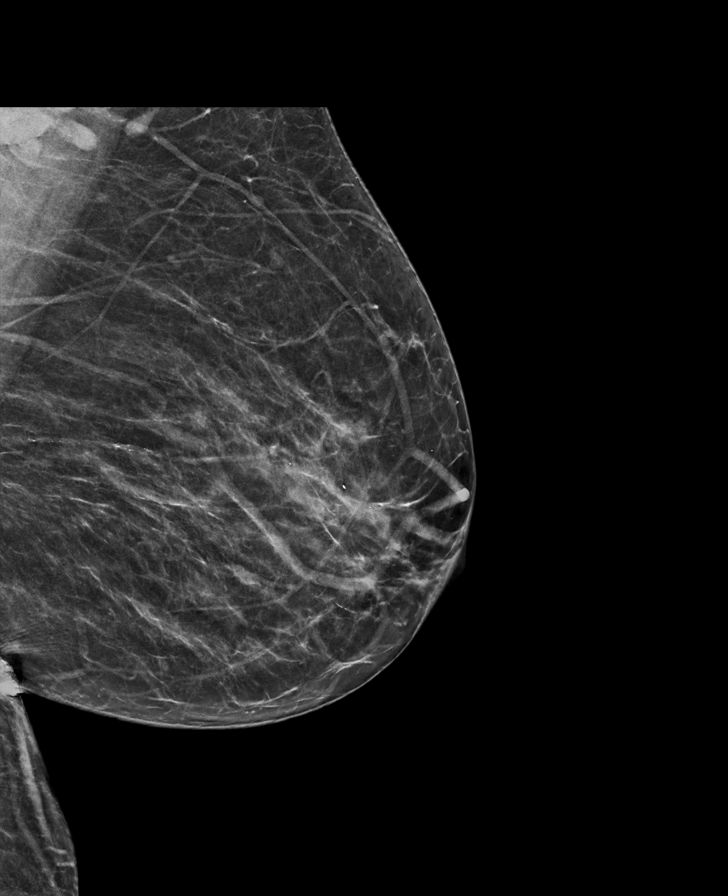

[R MLO tomo · tomo slice 33/65.0]
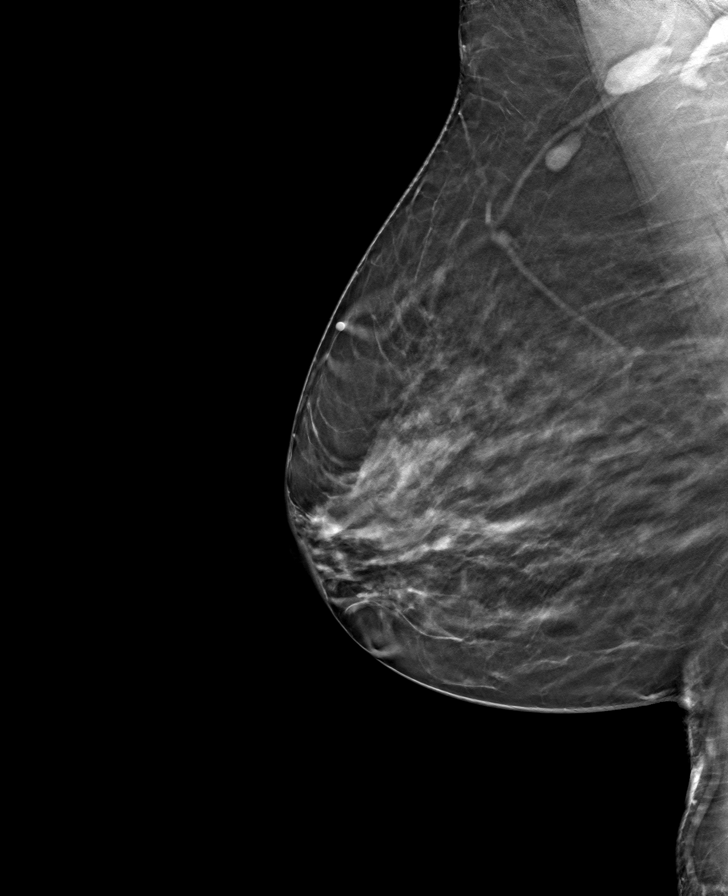

[R CC tomo · tomo slice 31/62.0]
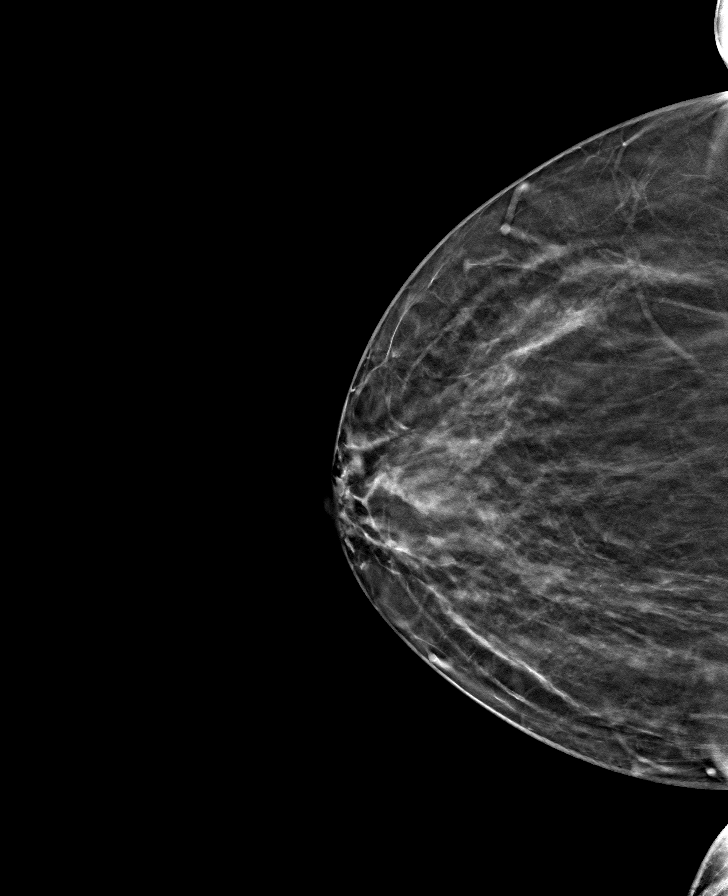

[L MLO tomo · tomo slice 33/65.0]
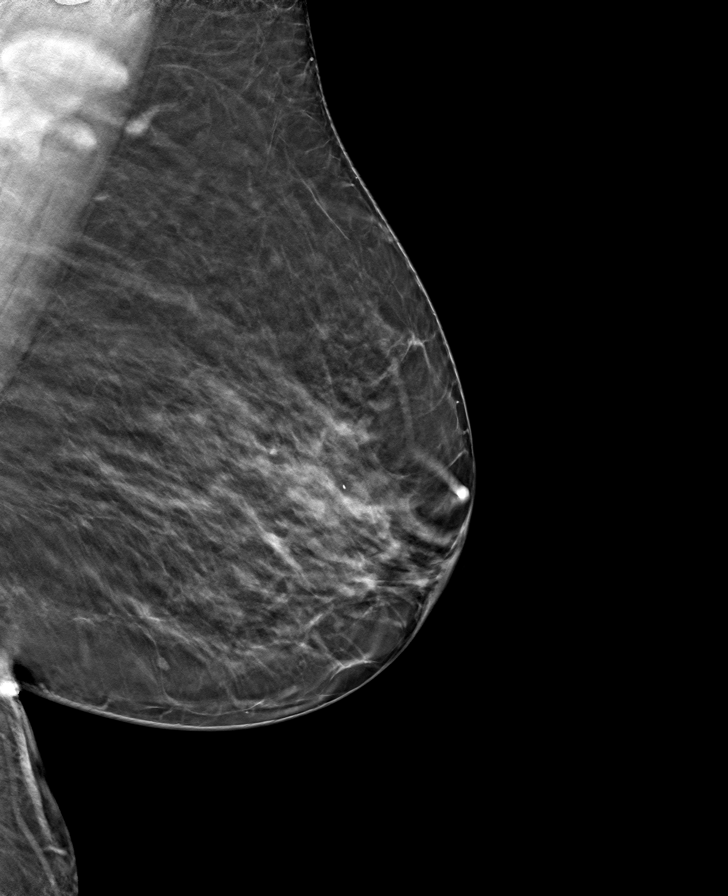

[L CC tomo · tomo slice 31/61.0]
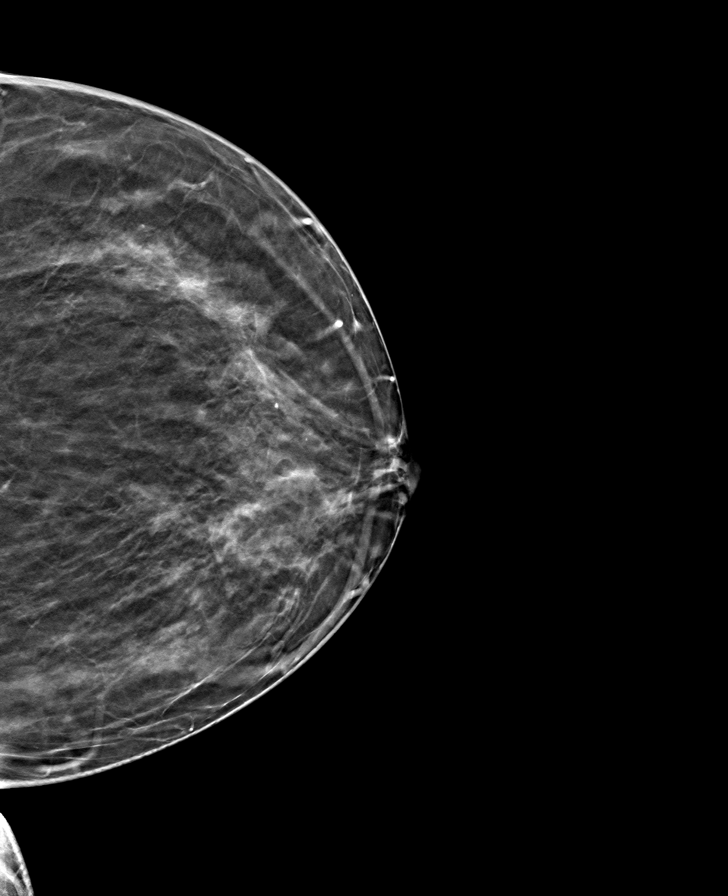

[8 of 24 positions shown; findings below may reference images not displayed]

ACR Breast Density Category b: There are scattered areas of
fibroglandular density.
FINDINGS: In the right breast, a possible mass warrants further evaluation. In
the left breast, no findings suspicious for malignancy. Images were
processed with CAD.
IMPRESSION: Further evaluation is suggested for possible mass in the right
breast.

RECOMMENDATION:
Diagnostic mammogram and possibly ultrasound of the right breast.
(Code:[id number]-[id number])

The patient will be contacted regarding the findings, and additional
imaging will be scheduled.

BI-RADS CATEGORY  0: Incomplete. Need additional imaging evaluation
and/or prior mammograms for comparison.

## 2021-06-14 NOTE — Progress Notes (Deleted)
Primary Physician/Referring:  [name], MD  Patient ID: Brittany Carroll, female    DOB: 01/20/1956, 66 y.o.   MRN: [id number]  No chief complaint on file.  HPI:    Brittany Carroll  is a 66 y.o. Female patient with history of atrial fibrillation S/P Cardioversion in 2008 without recurrence, hypertension, mixed hyperlipidemia, diabetes mellitus, tobacco use, hyperthyroidism previously treated with methimazole, fibromyalgia and chronic pain.  Patient was seen in 2021 for consultation regarding remote atrial fibrillation.   Patient presents for 45-monthfollow-up of hypertension, hyperlipidemia, atrial fibrillation.  Last office visit increase hydrochlorothiazide to 25 mg daily, repeat BMP has unfortunately not been done. ***  ***Recent lipids?   Patient presents for 8-week follow-up.  At last office visit started hydrochlorothiazide 12.5 mg daily given elevated blood pressure, repeat BMP remained stable.  Patient is tolerating hydrochlorothiazide without issue.  She is also working on quitting smoking, patient is congratulated on her efforts.  Unfortunately office blood pressure as well as home blood pressure readings remain elevated above goal.  Patient admits to high sodium intake.  Denies chest pain, dyspnea, syncope, near syncope, palpitations, dizziness.  Denies orthopnea, PND, leg swelling.    Previously PCP stopped amlodipine due to concern that it caused hives, which have resoled since stopping amlodipine.   Past Medical History:  Diagnosis Date   Cataract    [place]   [place]    Fibromyalgia    History of atrial fibrillation without current medication    [place]   Hyperlipidemia    Hypertension    Past Surgical History:  Procedure Laterality Date   CERVICAL FUSION     ECTOPIC PREGNANCY SURGERY     LUMBAR FUSION     PARTIAL HYSTERECTOMY     Family History  Problem Relation Age of Onset   Atrial fibrillation Mother     Hypertension Mother    Hyperlipidemia Mother    Leukemia Father    Diabetes Brother    Hypertension Brother    Hyperlipidemia Brother    Diabetes Brother    Hyperlipidemia Brother    Hypertension Brother    Social History   Tobacco Use   Smoking status: Former    Packs/day: 0.50    Years: 35.00    Pack years: 17.50    Types: Cigarettes    Quit date: 12/13/2020    Years since quitting: 0.5   Smokeless tobacco: Never   Tobacco comments:    1 PACK EVERY 2 DAYS  Substance Use Topics   Alcohol use: Yes    Comment: occasional   ROS  Review of Systems  Constitutional: Negative for malaise/fatigue and weight gain.  Cardiovascular:  Negative for chest pain, claudication, dyspnea on exertion, leg swelling, near-syncope, orthopnea, palpitations, paroxysmal nocturnal dyspnea and syncope.  Respiratory:  Negative for shortness of breath.   Hematologic/Lymphatic: Does not bruise/bleed easily.  Musculoskeletal:  Positive for arthritis, back pain, muscle cramps and neck pain.  Neurological:  Negative for dizziness.  Objective  There were no vitals taken for this visit.  Vitals with BMI 03/15/2021 03/15/2021 01/17/2021  Height - _0  -  Weight - 235 lbs 3 oz -  BMI - 379.03-  Systolic [id number] Diastolic 66 80 62  Pulse 84 87 82     Physical Exam Vitals reviewed.  Constitutional:      Appearance: She is well-developed.  Neck:     Thyroid: No thyromegaly.  Cardiovascular:     Rate and Rhythm: Normal rate and regular rhythm.     Pulses: Intact distal pulses.          Carotid pulses are 2+ on the right side and 2+ on the left side.      Radial pulses are 2+ on the right side and 2+ on the left side.       Dorsalis pedis pulses are 2+ on the right side and 2+ on the left side.     Heart sounds: Normal heart sounds, S1 normal and S2 normal. No murmur heard.   No gallop.     Comments: No leg edema, no JVD. Pulmonary:     Effort: Pulmonary effort is normal. No respiratory  distress.     Breath sounds: Normal breath sounds. No wheezing, rhonchi or rales.  Musculoskeletal:     Right lower leg: No edema.     Left lower leg: No edema.  Skin:    General: Skin is warm and dry.  Neurological:     Mental Status: She is alert.   Laboratory examination:   CMP Latest Ref Rng & Units 01/25/2021  Glucose 65 - 99 mg/dL 160(H)  BUN 8 - 27 mg/dL 12  Creatinine 0.57 - 1.00 mg/dL 0.88  Sodium 134 - 144 mmol/L 139  Potassium 3.5 - 5.2 mmol/L 4.7  Chloride 96 - 106 mmol/L 102  CO2 20 - 29 mmol/L 23  Calcium 8.7 - 10.3 mg/dL 9.2   No flowsheet data found. Lipid Panel  No results found for: CHOL, TRIG, HDL, CHOLHDL, VLDL, LDLCALC, LDLDIRECT HEMOGLOBIN A1C No results found for: HGBA1C, MPG TSH No results for input(s): TSH in the last 8760 hours.   External labs: 11/09/2020: Hemoglobin 8.8, hematocrit 25.8, MCV 76.5, platelet 197 Magnesium 2.0 A1c 7.0% Sodium 138, potassium 4.1, BUN 19, creatinine 0.68, GFR >90  05/08/2019: Serum glucose 157 mg, BUN 11, creatinine 0.8, eGFR >72 mL.  Potassium 4.8.  CMP normal.  CBC normal with minimal microcytic index.  Total cholesterol 188, triglycerides 172, HDL 57, LDL 97.  Non-HDL cholesterol 131.  TSH normal.  Vitamin D mildly reduced at 28.5.  A1c 6.1%.   Allergies   Allergies  Allergen Reactions   Lisinopril Hives   Metformin And Related Hives    Medications Prior to Visit:   Outpatient Medications Prior to Visit  Medication Sig Dispense Refill   amitriptyline (ELAVIL) 75 MG tablet Take 75 mg by mouth at bedtime.     ARIPiprazole (ABILIFY) 5 MG tablet Take 5 mg by mouth daily.     aspirin 325 MG tablet Take 325 mg by mouth daily.     betamethasone valerate ointment (VALISONE) 0.1 % Apply 1 application topically as needed.     cholecalciferol (VITAMIN D3) 25 MCG (1000 UT) tablet Take 1,000 Units by mouth daily.     clotrimazole-betamethasone (LOTRISONE) cream Apply 1 application topically as needed.      cyclobenzaprine (FLEXERIL) 10 MG tablet Take 10 mg by mouth 2 (two) times daily as needed.     esomeprazole (NEXIUM) 40 MG capsule Take 40 mg by mouth as needed.     hydrochlorothiazide (HYDRODIURIL) 25 MG tablet Take 1 tablet (25 mg total) by mouth daily. 90 tablet 3   Insulin Glargine (TOUJEO MAX SOLOSTAR [place]) Inject 15 Units into the skin daily.     insulin lispro protamine-lispro (HUMALOG 50/50 MIX) (50-50) 100 UNIT/ML SUSP injection Inject 5 Units into the skin 2 (two) times daily before  a meal.     JENTADUETO 2.09-998 MG TABS Take 1 tablet by mouth 2 (two) times daily.     losartan (COZAAR) 100 MG tablet Take 1 tablet (100 mg total) by mouth daily. 90 tablet 0   Magnesium 100 MG CAPS Take 2 capsules by mouth daily.     metoprolol succinate (TOPROL-XL) 50 MG 24 hr tablet TAKE 1 TABLET (50 MG TOTAL) BY MOUTH EVERY EVENING. TAKE WITH OR IMMEDIATELY FOLLOWING A MEAL. 90 tablet 0   morphine (MS CONTIN) 15 MG 12 hr tablet Take 1 tablet by mouth 2 (two) times daily.     mupirocin ointment (BACTROBAN) 2 % Apply 1 application topically as needed.     oxyCODONE (ROXICODONE) 15 MG immediate release tablet Take 20 mg by mouth 4 (four) times daily.     pregabalin (LYRICA) 200 MG capsule Take 200 mg by mouth 2 (two) times daily.     rosuvastatin (CRESTOR) 10 MG tablet Take 1 tablet by mouth daily.     sitaGLIPtin-metformin (JANUMET) 50-1000 MG tablet Take 1 tablet by mouth 2 (two) times daily.     triamcinolone cream (KENALOG) 0.5 % Apply 1 application topically as needed.     No facility-administered medications prior to visit.   Final Medications at End of Visit    No outpatient medications have been marked as taking for the 06/15/21 encounter (Appointment) with Rayetta Pigg, Jamille Fisher C, PA-C.   Radiology:  No results found.  Cardiac Studies:   Echocardiogram 03/23/2020:  1. Left ventricular ejection fraction, by estimation, is 60 to 65%. The  left ventricle has normal function. The left ventricle  has no regional  wall motion abnormalities. There is mild left ventricular hypertrophy.  Left ventricular diastolic parameters  are consistent with Grade II diastolic dysfunction (pseudonormalization).   2. Right ventricular systolic function is normal. The right ventricular  size is normal.   3. The mitral valve is normal in structure. Trivial mitral valve  regurgitation. No evidence of mitral stenosis.   4. The aortic valve is normal in structure. Aortic valve regurgitation is  not visualized. No aortic stenosis is present.   5. The inferior vena cava is normal in size with greater than 50%  respiratory variability, suggesting right atrial pressure of 3 mmHg.   EKG   01/17/2021: Sinus rhythm at a rate of 77 bpm with borderline first-degree AV block.  Left atrial enlargement.  Normal axis.  Poor R wave progression, cannot exclude anteroseptal infarct old.  IVCD.  PCP EKG 01/13/2021: Sinus tachycardia with single PAC at a rate of 109 bpm.  Normal axis.  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  Left atrial enlargement.  06/03/2020: Sinus tachycardia at rate of 103 bpm, left atrial enlargement, otherwise normal EKG.   EKG 06/05/2019: Normal sinus rhythm at rate of 79 bpm, normal axis.  Poor R-wave progression, cannot exclude anteroseptal infarct old, probably normal variant.  No evidence of ischemia,  Single PAC.   Assessment   No diagnosis found.     No orders of the defined types were placed in this encounter.  There are no discontinued medications.   This patients CHA2DS2-VASc Score 4 (HTN, DM, Age, F) and yearly risk of stroke 4.8%.   Recommendations:    Brittany Carroll  is a 66 y.o. Female patient with history of atrial fibrillation S/P Cardioversion in 2008 without recurrence, hypertension, mixed hyperlipidemia, diabetes mellitus, tobacco use, hyperthyroidism previously treated with methimazole, fibromyalgia and chronic pain.  Patient was seen in 2021 for consultation regarding  remote atrial  fibrillation.   Patient presents for 37-monthfollow-up of hypertension, hyperlipidemia, atrial fibrillation.  Last office visit increase hydrochlorothiazide to 25 mg daily, repeat BMP has unfortunately not been done. ***  ***Recent lipids?   Patient presents for 8-week follow-up.  At last office visit started hydrochlorothiazide 12.5 mg daily given elevated blood pressure, repeat BMP remained stable.  Patient is tolerating hydrochlorothiazide without issue, however her blood pressure remains uncontrolled.  Therefore shared decision was to increase hydrochlorothiazide from 12.5 to 25 mg p.o. daily.  We will repeat BMP in 1 week.  Patient will continue to monitor blood pressure on daily basis at home and notify our office if it remains >130/80 mmHg.  She is otherwise stable from a cardiovascular standpoint.  Follow-up in 3 months, sooner if needed, for hypertension, hyperlipidemia, atrial fibrillation.   CAlethia Berthold PA-C 06/14/2021, 12:18 PM Office: [phone]

## 2021-06-15 ENCOUNTER — Ambulatory Visit: Payer: Medicare (Managed Care) | Admitting: Student

## 2021-06-15 DIAGNOSIS — I480 Paroxysmal atrial fibrillation: Secondary | ICD-10-CM

## 2021-06-15 DIAGNOSIS — I10 Essential (primary) hypertension: Secondary | ICD-10-CM

## 2021-06-15 DIAGNOSIS — E782 Mixed hyperlipidemia: Secondary | ICD-10-CM

## 2021-06-15 NOTE — Progress Notes (Deleted)
Primary Physician/Referring:  [name], MD  Patient ID: Brittany Carroll, female    DOB: 01/20/1956, 66 y.o.   MRN: [id number]  No chief complaint on file.  HPI:    Brittany Carroll  is a 66 y.o. Female patient with history of atrial fibrillation S/P Cardioversion in 2008 without recurrence, hypertension, mixed hyperlipidemia, diabetes mellitus, tobacco use, hyperthyroidism previously treated with methimazole, fibromyalgia and chronic pain.  Patient was seen in 2021 for consultation regarding remote atrial fibrillation.   Patient presents for 45-monthfollow-up of hypertension, hyperlipidemia, atrial fibrillation.  Last office visit increase hydrochlorothiazide to 25 mg daily, repeat BMP has unfortunately not been done. ***  ***Recent lipids?   Patient presents for 8-week follow-up.  At last office visit started hydrochlorothiazide 12.5 mg daily given elevated blood pressure, repeat BMP remained stable.  Patient is tolerating hydrochlorothiazide without issue.  She is also working on quitting smoking, patient is congratulated on her efforts.  Unfortunately office blood pressure as well as home blood pressure readings remain elevated above goal.  Patient admits to high sodium intake.  Denies chest pain, dyspnea, syncope, near syncope, palpitations, dizziness.  Denies orthopnea, PND, leg swelling.    Previously PCP stopped amlodipine due to concern that it caused hives, which have resoled since stopping amlodipine.   Past Medical History:  Diagnosis Date   Cataract    [place]   [place]    Fibromyalgia    History of atrial fibrillation without current medication    [place]   Hyperlipidemia    Hypertension    Past Surgical History:  Procedure Laterality Date   CERVICAL FUSION     ECTOPIC PREGNANCY SURGERY     LUMBAR FUSION     PARTIAL HYSTERECTOMY     Family History  Problem Relation Age of Onset   Atrial fibrillation Mother     Hypertension Mother    Hyperlipidemia Mother    Leukemia Father    Diabetes Brother    Hypertension Brother    Hyperlipidemia Brother    Diabetes Brother    Hyperlipidemia Brother    Hypertension Brother    Social History   Tobacco Use   Smoking status: Former    Packs/day: 0.50    Years: 35.00    Pack years: 17.50    Types: Cigarettes    Quit date: 12/13/2020    Years since quitting: 0.5   Smokeless tobacco: Never   Tobacco comments:    1 PACK EVERY 2 DAYS  Substance Use Topics   Alcohol use: Yes    Comment: occasional   ROS  Review of Systems  Constitutional: Negative for malaise/fatigue and weight gain.  Cardiovascular:  Negative for chest pain, claudication, dyspnea on exertion, leg swelling, near-syncope, orthopnea, palpitations, paroxysmal nocturnal dyspnea and syncope.  Respiratory:  Negative for shortness of breath.   Hematologic/Lymphatic: Does not bruise/bleed easily.  Musculoskeletal:  Positive for arthritis, back pain, muscle cramps and neck pain.  Neurological:  Negative for dizziness.  Objective  There were no vitals taken for this visit.  Vitals with BMI 03/15/2021 03/15/2021 01/17/2021  Height - _0  -  Weight - 235 lbs 3 oz -  BMI - 379.03-  Systolic [id number] Diastolic 66 80 62  Pulse 84 87 82     Physical Exam Vitals reviewed.  Constitutional:      Appearance: She is well-developed.  Neck:     Thyroid: No thyromegaly.  Cardiovascular:     Rate and Rhythm: Normal rate and regular rhythm.     Pulses: Intact distal pulses.          Carotid pulses are 2+ on the right side and 2+ on the left side.      Radial pulses are 2+ on the right side and 2+ on the left side.       Dorsalis pedis pulses are 2+ on the right side and 2+ on the left side.     Heart sounds: Normal heart sounds, S1 normal and S2 normal. No murmur heard.   No gallop.     Comments: No leg edema, no JVD. Pulmonary:     Effort: Pulmonary effort is normal. No respiratory  distress.     Breath sounds: Normal breath sounds. No wheezing, rhonchi or rales.  Musculoskeletal:     Right lower leg: No edema.     Left lower leg: No edema.  Skin:    General: Skin is warm and dry.  Neurological:     Mental Status: She is alert.   Laboratory examination:   CMP Latest Ref Rng & Units 01/25/2021  Glucose 65 - 99 mg/dL 160(H)  BUN 8 - 27 mg/dL 12  Creatinine 0.57 - 1.00 mg/dL 0.88  Sodium 134 - 144 mmol/L 139  Potassium 3.5 - 5.2 mmol/L 4.7  Chloride 96 - 106 mmol/L 102  CO2 20 - 29 mmol/L 23  Calcium 8.7 - 10.3 mg/dL 9.2   No flowsheet data found. Lipid Panel  No results found for: CHOL, TRIG, HDL, CHOLHDL, VLDL, LDLCALC, LDLDIRECT HEMOGLOBIN A1C No results found for: HGBA1C, MPG TSH No results for input(s): TSH in the last 8760 hours.   External labs: 11/09/2020: Hemoglobin 8.8, hematocrit 25.8, MCV 76.5, platelet 197 Magnesium 2.0 A1c 7.0% Sodium 138, potassium 4.1, BUN 19, creatinine 0.68, GFR >90  05/08/2019: Serum glucose 157 mg, BUN 11, creatinine 0.8, eGFR >72 mL.  Potassium 4.8.  CMP normal.  CBC normal with minimal microcytic index.  Total cholesterol 188, triglycerides 172, HDL 57, LDL 97.  Non-HDL cholesterol 131.  TSH normal.  Vitamin D mildly reduced at 28.5.  A1c 6.1%.   Allergies   Allergies  Allergen Reactions   Lisinopril Hives   Metformin And Related Hives    Medications Prior to Visit:   Outpatient Medications Prior to Visit  Medication Sig Dispense Refill   amitriptyline (ELAVIL) 75 MG tablet Take 75 mg by mouth at bedtime.     ARIPiprazole (ABILIFY) 5 MG tablet Take 5 mg by mouth daily.     aspirin 325 MG tablet Take 325 mg by mouth daily.     betamethasone valerate ointment (VALISONE) 0.1 % Apply 1 application topically as needed.     cholecalciferol (VITAMIN D3) 25 MCG (1000 UT) tablet Take 1,000 Units by mouth daily.     clotrimazole-betamethasone (LOTRISONE) cream Apply 1 application topically as needed.      cyclobenzaprine (FLEXERIL) 10 MG tablet Take 10 mg by mouth 2 (two) times daily as needed.     esomeprazole (NEXIUM) 40 MG capsule Take 40 mg by mouth as needed.     hydrochlorothiazide (HYDRODIURIL) 25 MG tablet Take 1 tablet (25 mg total) by mouth daily. 90 tablet 3   Insulin Glargine (TOUJEO MAX SOLOSTAR [place]) Inject 15 Units into the skin daily.     insulin lispro protamine-lispro (HUMALOG 50/50 MIX) (50-50) 100 UNIT/ML SUSP injection Inject 5 Units into the skin 2 (two) times daily before  a meal.     JENTADUETO 2.09-998 MG TABS Take 1 tablet by mouth 2 (two) times daily.     losartan (COZAAR) 100 MG tablet Take 1 tablet (100 mg total) by mouth daily. 90 tablet 0   Magnesium 100 MG CAPS Take 2 capsules by mouth daily.     metoprolol succinate (TOPROL-XL) 50 MG 24 hr tablet TAKE 1 TABLET (50 MG TOTAL) BY MOUTH EVERY EVENING. TAKE WITH OR IMMEDIATELY FOLLOWING A MEAL. 90 tablet 0   morphine (MS CONTIN) 15 MG 12 hr tablet Take 1 tablet by mouth 2 (two) times daily.     mupirocin ointment (BACTROBAN) 2 % Apply 1 application topically as needed.     oxyCODONE (ROXICODONE) 15 MG immediate release tablet Take 20 mg by mouth 4 (four) times daily.     pregabalin (LYRICA) 200 MG capsule Take 200 mg by mouth 2 (two) times daily.     rosuvastatin (CRESTOR) 10 MG tablet Take 1 tablet by mouth daily.     sitaGLIPtin-metformin (JANUMET) 50-1000 MG tablet Take 1 tablet by mouth 2 (two) times daily.     triamcinolone cream (KENALOG) 0.5 % Apply 1 application topically as needed.     No facility-administered medications prior to visit.   Final Medications at End of Visit    No outpatient medications have been marked as taking for the 06/15/21 encounter (Appointment) with Rayetta Pigg, Jessi Jessop C, PA-C.   Radiology:  No results found.  Cardiac Studies:   Echocardiogram 03/23/2020:  1. Left ventricular ejection fraction, by estimation, is 60 to 65%. The  left ventricle has normal function. The left ventricle  has no regional  wall motion abnormalities. There is mild left ventricular hypertrophy.  Left ventricular diastolic parameters  are consistent with Grade II diastolic dysfunction (pseudonormalization).   2. Right ventricular systolic function is normal. The right ventricular  size is normal.   3. The mitral valve is normal in structure. Trivial mitral valve  regurgitation. No evidence of mitral stenosis.   4. The aortic valve is normal in structure. Aortic valve regurgitation is  not visualized. No aortic stenosis is present.   5. The inferior vena cava is normal in size with greater than 50%  respiratory variability, suggesting right atrial pressure of 3 mmHg.   EKG   01/17/2021: Sinus rhythm at a rate of 77 bpm with borderline first-degree AV block.  Left atrial enlargement.  Normal axis.  Poor R wave progression, cannot exclude anteroseptal infarct old.  IVCD.  PCP EKG 01/13/2021: Sinus tachycardia with single PAC at a rate of 109 bpm.  Normal axis.  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  Left atrial enlargement.  06/03/2020: Sinus tachycardia at rate of 103 bpm, left atrial enlargement, otherwise normal EKG.   EKG 06/05/2019: Normal sinus rhythm at rate of 79 bpm, normal axis.  Poor R-wave progression, cannot exclude anteroseptal infarct old, probably normal variant.  No evidence of ischemia,  Single PAC.   Assessment   No diagnosis found.     No orders of the defined types were placed in this encounter.  There are no discontinued medications.   This patients CHA2DS2-VASc Score 4 (HTN, DM, Age, F) and yearly risk of stroke 4.8%.   Recommendations:    Brittany Carroll  is a 66 y.o. Female patient with history of atrial fibrillation S/P Cardioversion in 2008 without recurrence, hypertension, mixed hyperlipidemia, diabetes mellitus, tobacco use, hyperthyroidism previously treated with methimazole, fibromyalgia and chronic pain.  Patient was seen in 2021 for consultation regarding  remote atrial  fibrillation.   Patient presents for 49-month follow-up of hypertension, hyperlipidemia, atrial fibrillation.  Last office visit increase hydrochlorothiazide to 25 mg daily, repeat BMP has unfortunately not been done. ***  ***Recent lipids?   Patient presents for 8-week follow-up.  At last office visit started hydrochlorothiazide 12.5 mg daily given elevated blood pressure, repeat BMP remained stable.  Patient is tolerating hydrochlorothiazide without issue, however her blood pressure remains uncontrolled.  Therefore shared decision was to increase hydrochlorothiazide from 12.5 to 25 mg p.o. daily.  We will repeat BMP in 1 week.  Patient will continue to monitor blood pressure on daily basis at home and notify our office if it remains >130/80 mmHg.  She is otherwise stable from a cardiovascular standpoint.  Follow-up in 3 months, sooner if needed, for hypertension, hyperlipidemia, atrial fibrillation.   Brittany Berthold, PA-C 06/15/2021, 1:46 PM Office: [phone]

## 2021-06-16 NOTE — Progress Notes (Signed)
Primary Physician/Referring:  [name], MD  Patient ID: Brittany Carroll, female    DOB: [birth year], 66 y.o.   MRN: [id number]  Chief Complaint  Patient presents with   Hypertension   Follow-up    3 months   HPI:    Brittany Carroll  is a 66 y.o. Female patient with history of atrial fibrillation S/P Cardioversion in 2008 without recurrence, hypertension, mixed hyperlipidemia, diabetes mellitus, tobacco use, hyperthyroidism previously treated with methimazole, fibromyalgia and chronic pain.  Patient was seen in 2021 for consultation regarding remote atrial fibrillation.   Patient presents for 13-monthfollow-up of hypertension, hyperlipidemia, atrial fibrillation.  Last office visit increase hydrochlorothiazide to 25 mg daily, repeat BMP has unfortunately not been done.  Patient is without specific complaints today.  However her blood pressure remains uncontrolled both in the office and at home.  She unfortunately continues to smoke.  She admits to high sodium intake.  Denies chest pain, dyspnea, syncope, near syncope, palpitations, dizziness.  Denies orthopnea, PND, leg swelling.    Previously PCP stopped amlodipine due to concern that it caused hives, which have resoled since stopping amlodipine.   Past Medical History:  Diagnosis Date   Cataract    OU   Diabetes mellitus without complication [place]    Fibromyalgia    History of atrial fibrillation without current medication    [place]   Hyperlipidemia    Hypertension    Past Surgical History:  Procedure Laterality Date   CERVICAL FUSION     ECTOPIC PREGNANCY SURGERY     LUMBAR FUSION     PARTIAL HYSTERECTOMY     Family History  Problem Relation Age of Onset   Atrial fibrillation Mother    Hypertension Mother    Hyperlipidemia Mother    Leukemia Father    Diabetes Brother    Hypertension Brother    Hyperlipidemia Brother    Diabetes Brother    Hyperlipidemia Brother    Hypertension Brother     Social History   Tobacco Use   Smoking status: Some Days    Packs/day: 0.50    Years: 35.00    Pack years: 17.50    Types: Cigarettes    Last attempt to quit: 12/13/2020    Years since quitting: 0.5   Smokeless tobacco: Never   Tobacco comments:    Started 2 days ago again, and trying to wing herself off again.   Substance Use Topics   Alcohol use: Yes    Comment: occasional   ROS  Review of Systems  Constitutional: Negative for malaise/fatigue and weight gain.  Cardiovascular:  Negative for chest pain, claudication, dyspnea on exertion, leg swelling, near-syncope, orthopnea, palpitations, paroxysmal nocturnal dyspnea and syncope.  Respiratory:  Negative for shortness of breath.   Hematologic/Lymphatic: Does not bruise/bleed easily.  Neurological:  Negative for dizziness.   Objective  Blood pressure (!) 177/89, pulse 77, temperature 98.3 F (36.8 C), temperature source Temporal, resp. rate 16, height _0  (1.702 m), weight 229 lb 12.8 oz (104.2 kg), SpO2 94 %.  Vitals with BMI 06/19/2021 03/15/2021 03/15/2021  Height _1  - _2   Weight 229 lbs 13 oz - 235 lbs 3 oz  BMI 385.46- 327.03 Systolic [id number] Diastolic 89 66 80  Pulse 77 84 87     Physical Exam Vitals reviewed.  Constitutional:      Appearance: She is well-developed.  Neck:     Thyroid: No thyromegaly.  Cardiovascular:     Rate and Rhythm: Normal rate and regular rhythm.     Pulses: Intact distal pulses.          Carotid pulses are 2+ on the right side and 2+ on the left side.      Radial pulses are 2+ on the right side and 2+ on the left side.       Dorsalis pedis pulses are 2+ on the right side and 2+ on the left side.     Heart sounds: Normal heart sounds, S1 normal and S2 normal. No murmur heard.   No gallop.     Comments: No leg edema, no JVD. Pulmonary:     Effort: Pulmonary effort is normal. No respiratory distress.     Breath sounds: Normal breath sounds. No wheezing, rhonchi or rales.   Musculoskeletal:     Right lower leg: No edema.     Left lower leg: No edema.  Skin:    General: Skin is warm and dry.  Neurological:     General: No focal deficit present.     Mental Status: She is oriented to person, place, and time.     Cranial Nerves: No cranial nerve deficit.   Laboratory examination:   CMP Latest Ref Rng & Units 01/25/2021  Glucose 65 - 99 mg/dL 160(H)  BUN 8 - 27 mg/dL 12  Creatinine 0.57 - 1.00 mg/dL 0.88  Sodium 134 - 144 mmol/L 139  Potassium 3.5 - 5.2 mmol/L 4.7  Chloride 96 - 106 mmol/L 102  CO2 20 - 29 mmol/L 23  Calcium 8.7 - 10.3 mg/dL 9.2   No flowsheet data found. Lipid Panel  No results found for: CHOL, TRIG, HDL, CHOLHDL, VLDL, LDLCALC, LDLDIRECT HEMOGLOBIN A1C No results found for: HGBA1C, MPG TSH No results for input(s): TSH in the last 8760 hours.   External labs: 11/09/2020: Hemoglobin 8.8, hematocrit 25.8, MCV 76.5, platelet 197 Magnesium 2.0 A1c 7.0% Sodium 138, potassium 4.1, BUN 19, creatinine 0.68, GFR >90  05/08/2019: Serum glucose 157 mg, BUN 11, creatinine 0.8, eGFR >72 mL.  Potassium 4.8.  CMP normal.  CBC normal with minimal microcytic index.  Total cholesterol 188, triglycerides 172, HDL 57, LDL 97.  Non-HDL cholesterol 131.  TSH normal.  Vitamin D mildly reduced at 28.5.  A1c 6.1%.    Allergies   Allergies  Allergen Reactions   Lisinopril Hives   Metformin And Related Hives    Medications Prior to Visit:   Outpatient Medications Prior to Visit  Medication Sig Dispense Refill   amitriptyline (ELAVIL) 75 MG tablet Take 75 mg by mouth at bedtime.     aspirin 325 MG tablet Take 325 mg by mouth daily.     betamethasone valerate ointment (VALISONE) 0.1 % Apply 1 application topically as needed.     cholecalciferol (VITAMIN D3) 25 MCG (1000 UT) tablet Take 1,000 Units by mouth daily.     cloNIDine (CATAPRES) 0.1 MG tablet Take 0.1 mg by mouth 3 (three) times daily as needed.     clotrimazole-betamethasone  (LOTRISONE) cream Apply 1 application topically as needed.     cyclobenzaprine (FLEXERIL) 10 MG tablet Take 10 mg by mouth 2 (two) times daily as needed.     esomeprazole (NEXIUM) 40 MG capsule Take 40 mg by mouth as needed.     hydrochlorothiazide (HYDRODIURIL) 25 MG tablet Take 1 tablet (25 mg total) by mouth daily. 90 tablet 3   Insulin Glargine (TOUJEO MAX SOLOSTAR [place]) Inject 15 Units  into the skin daily.     insulin lispro protamine-lispro (HUMALOG 50/50 MIX) (50-50) 100 UNIT/ML SUSP injection Inject 5 Units into the skin 2 (two) times daily before a meal.     JENTADUETO 2.09-998 MG TABS Take 1 tablet by mouth 2 (two) times daily.     losartan (COZAAR) 100 MG tablet Take 1 tablet (100 mg total) by mouth daily. 90 tablet 0   Magnesium 100 MG CAPS Take 2 capsules by mouth daily.     methocarbamol (ROBAXIN) 500 MG tablet Take 500 mg by mouth 2 (two) times daily as needed.     metoprolol succinate (TOPROL-XL) 50 MG 24 hr tablet TAKE 1 TABLET (50 MG TOTAL) BY MOUTH EVERY EVENING. TAKE WITH OR IMMEDIATELY FOLLOWING A MEAL. 90 tablet 0   morphine (MS CONTIN) 15 MG 12 hr tablet Take 1 tablet by mouth 2 (two) times daily.     mupirocin ointment (BACTROBAN) 2 % Apply 1 application topically as needed.     ondansetron (ZOFRAN) 4 MG tablet Take 4 mg by mouth 3 (three) times daily as needed.     Oxycodone HCl 20 MG TABS Take 20 mg by mouth 4 (four) times daily.     pregabalin (LYRICA) 200 MG capsule Take 200 mg by mouth 2 (two) times daily.     rosuvastatin (CRESTOR) 10 MG tablet Take 1 tablet by mouth daily.     sitaGLIPtin-metformin (JANUMET) 50-1000 MG tablet Take 1 tablet by mouth 2 (two) times daily.     triamcinolone cream (KENALOG) 0.5 % Apply 1 application topically as needed.     ARIPiprazole (ABILIFY) 5 MG tablet Take 5 mg by mouth daily.     No facility-administered medications prior to visit.   Final Medications at End of Visit    Current Meds  Medication Sig   amitriptyline (ELAVIL)  75 MG tablet Take 75 mg by mouth at bedtime.   aspirin 325 MG tablet Take 325 mg by mouth daily.   betamethasone valerate ointment (VALISONE) 0.1 % Apply 1 application topically as needed.   cholecalciferol (VITAMIN D3) 25 MCG (1000 UT) tablet Take 1,000 Units by mouth daily.   cloNIDine (CATAPRES) 0.1 MG tablet Take 0.1 mg by mouth 3 (three) times daily as needed.   clotrimazole-betamethasone (LOTRISONE) cream Apply 1 application topically as needed.   cyclobenzaprine (FLEXERIL) 10 MG tablet Take 10 mg by mouth 2 (two) times daily as needed.   esomeprazole (NEXIUM) 40 MG capsule Take 40 mg by mouth as needed.   hydrALAZINE (APRESOLINE) 50 MG tablet Take 1 tablet (50 mg total) by mouth 3 (three) times daily.   hydrochlorothiazide (HYDRODIURIL) 25 MG tablet Take 1 tablet (25 mg total) by mouth daily.   Insulin Glargine (TOUJEO MAX SOLOSTAR [place]) Inject 15 Units into the skin daily.   insulin lispro protamine-lispro (HUMALOG 50/50 MIX) (50-50) 100 UNIT/ML SUSP injection Inject 5 Units into the skin 2 (two) times daily before a meal.   JENTADUETO 2.09-998 MG TABS Take 1 tablet by mouth 2 (two) times daily.   losartan (COZAAR) 100 MG tablet Take 1 tablet (100 mg total) by mouth daily.   Magnesium 100 MG CAPS Take 2 capsules by mouth daily.   methocarbamol (ROBAXIN) 500 MG tablet Take 500 mg by mouth 2 (two) times daily as needed.   metoprolol succinate (TOPROL-XL) 50 MG 24 hr tablet TAKE 1 TABLET (50 MG TOTAL) BY MOUTH EVERY EVENING. TAKE WITH OR IMMEDIATELY FOLLOWING A MEAL.   morphine (MS CONTIN)  15 MG 12 hr tablet Take 1 tablet by mouth 2 (two) times daily.   mupirocin ointment (BACTROBAN) 2 % Apply 1 application topically as needed.   ondansetron (ZOFRAN) 4 MG tablet Take 4 mg by mouth 3 (three) times daily as needed.   Oxycodone HCl 20 MG TABS Take 20 mg by mouth 4 (four) times daily.   pregabalin (LYRICA) 200 MG capsule Take 200 mg by mouth 2 (two) times daily.   rosuvastatin (CRESTOR) 10 MG  tablet Take 1 tablet by mouth daily.   sitaGLIPtin-metformin (JANUMET) 50-1000 MG tablet Take 1 tablet by mouth 2 (two) times daily.   triamcinolone cream (KENALOG) 0.5 % Apply 1 application topically as needed.   Radiology:  No results found.  Cardiac Studies:   Echocardiogram 03/23/2020:  1. Left ventricular ejection fraction, by estimation, is 60 to 65%. The  left ventricle has normal function. The left ventricle has no regional  wall motion abnormalities. There is mild left ventricular hypertrophy.  Left ventricular diastolic parameters  are consistent with Grade II diastolic dysfunction (pseudonormalization).   2. Right ventricular systolic function is normal. The right ventricular  size is normal.   3. The mitral valve is normal in structure. Trivial mitral valve  regurgitation. No evidence of mitral stenosis.   4. The aortic valve is normal in structure. Aortic valve regurgitation is  not visualized. No aortic stenosis is present.   5. The inferior vena cava is normal in size with greater than 50%  respiratory variability, suggesting right atrial pressure of 3 mmHg.   EKG   01/17/2021: Sinus rhythm at a rate of 77 bpm with borderline first-degree AV block.  Left atrial enlargement.  Normal axis.  Poor R wave progression, cannot exclude anteroseptal infarct old.  IVCD.  PCP EKG 01/13/2021: Sinus tachycardia with single PAC at a rate of 109 bpm.  Normal axis.  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  Left atrial enlargement.  06/03/2020: Sinus tachycardia at rate of 103 bpm, left atrial enlargement, otherwise normal EKG.   EKG 06/05/2019: Normal sinus rhythm at rate of 79 bpm, normal axis.  Poor R-wave progression, cannot exclude anteroseptal infarct old, probably normal variant.  No evidence of ischemia,  Single PAC.   Assessment     ICD-10-CM   1. Primary hypertension  I10     2. Mixed hyperlipidemia  E78.2 Lipid Panel With LDL/HDL Ratio    3. Atrial fibrillation, unspecified type ([place])   I48.91     4. Resistant hypertension  I10 PCV RENAL/RENAL ARTERY DUPLEX COMPLETE    Basic metabolic panel        Meds ordered this encounter  Medications   hydrALAZINE (APRESOLINE) 50 MG tablet    Sig: Take 1 tablet (50 mg total) by mouth 3 (three) times daily.    Dispense:  270 tablet    Refill:  3   Medications Discontinued During This Encounter  Medication Reason   ARIPiprazole (ABILIFY) 5 MG tablet     This patients CHA2DS2-VASc Score 4 (HTN, DM, Age, F) and yearly risk of stroke 4.8%.   Recommendations:    Brittany Carroll  is a 66 y.o. Female patient with history of atrial fibrillation S/P Cardioversion in 2008 without recurrence, hypertension, mixed hyperlipidemia, diabetes mellitus, tobacco use, hyperthyroidism previously treated with methimazole, fibromyalgia and chronic pain.  Patient was seen in 2021 for consultation regarding remote atrial fibrillation.   Patient presents for 74-month follow-up of hypertension, hyperlipidemia, atrial fibrillation.  Last office visit increase hydrochlorothiazide to 25 mg  daily, repeat BMP has unfortunately not been done.  [name] blood pressure remains uncontrolled.  She is however asymptomatic from a cardiovascular standpoint and without symptoms suggestive of CVA or TIA.  We will repeat BNP as this has not been done since increasing hydrochlorothiazide.  We will also obtain lipid profile testing, as there is no recent for review.  Regard to hypertension we will add hydralazine 50 mg 3 times daily.  We will also obtain renal artery duplex given resistant hypertension.  Patient will enroll with our office in remote patient monitoring of blood pressure with clinical pharmacist Manuela Schwartz.  Given that she will be enrolled in repletion monitoring we will follow-up in 3 months, sooner if needed.   Alethia Berthold, PA-C 06/19/2021, 11:04 AM Office: [phone]

## 2021-06-19 ENCOUNTER — Encounter: Payer: Self-pay | Admitting: Student

## 2021-06-19 ENCOUNTER — Other Ambulatory Visit: Payer: Self-pay

## 2021-06-19 ENCOUNTER — Ambulatory Visit: Payer: Medicare (Managed Care) | Admitting: Student

## 2021-06-19 VITALS — BP 177/89 | HR 77 | Temp 98.3°F | Resp 16 | Ht 67.0 in | Wt 229.8 lb

## 2021-06-19 DIAGNOSIS — E782 Mixed hyperlipidemia: Secondary | ICD-10-CM

## 2021-06-19 DIAGNOSIS — I10 Essential (primary) hypertension: Secondary | ICD-10-CM

## 2021-06-19 DIAGNOSIS — I4891 Unspecified atrial fibrillation: Secondary | ICD-10-CM

## 2021-06-19 MED ORDER — HYDRALAZINE HCL 50 MG PO TABS: 50.0000 mg | ORAL_TABLET | Freq: Three times a day (TID) | ORAL | 3 refills | Status: DC

## 2021-06-27 ENCOUNTER — Other Ambulatory Visit: Payer: Medicare (Managed Care)

## 2021-07-10 ENCOUNTER — Other Ambulatory Visit: Payer: Self-pay | Admitting: Orthopaedic Surgery

## 2021-07-10 DIAGNOSIS — M4326 Fusion of spine, lumbar region: Secondary | ICD-10-CM

## 2021-07-14 ENCOUNTER — Ambulatory Visit
Admission: RE | Admit: 2021-07-14 | Discharge: 2021-07-14 | Disposition: A | Discharge status: Home / Self Care | Payer: Medicare Other | Source: Ambulatory Visit | Attending: Orthopaedic Surgery | Admitting: Orthopaedic Surgery

## 2021-07-14 DIAGNOSIS — M4326 Fusion of spine, lumbar region: Secondary | ICD-10-CM

## 2021-07-14 MED ORDER — DIAZEPAM 5 MG PO TABS: 5.0000 mg | ORAL_TABLET | Freq: Once | ORAL | Status: DC

## 2021-07-14 MED ORDER — MEPERIDINE HCL 50 MG/ML IJ SOLN: 50.0000 mg | Freq: Once | INTRAMUSCULAR | Status: DC | PRN

## 2021-07-14 MED ORDER — IOPAMIDOL (ISOVUE-M 200) INJECTION 41%
20.0000 mL | Freq: Once | INTRAMUSCULAR | Status: AC
  Administered 2021-07-14: 20 mL via INTRATHECAL

## 2021-07-14 MED ORDER — ONDANSETRON HCL 4 MG/2ML IJ SOLN: 4.0000 mg | Freq: Once | INTRAMUSCULAR | Status: DC | PRN

## 2021-07-14 NOTE — Discharge Instructions (Signed)

## 2021-07-20 NOTE — Progress Notes (Shared)
[facility] - Clinic Note  07/24/2021     CHIEF COMPLAINT Patient presents for No chief complaint on file.   HISTORY OF PRESENT ILLNESS: Brittany Carroll is a 66 y.o. female who presents to the clinic today for:    pt states vision is stable, she states she does she floaters and an occasional fol OD, she states her last A1c was 6 something, she is still on oral medication and 2 insulins, she is using Restasis for dry eyes  Referring physician: Karl Ito, DO [address] [place],  [place] [address]  HISTORICAL INFORMATION:   Selected notes from the MEDICAL RECORD NUMBER Referred by PCP for DM exam   CURRENT MEDICATIONS: No current outpatient medications on file. (Ophthalmic Drugs)   No current facility-administered medications for this visit. (Ophthalmic Drugs)   Current Outpatient Medications (Other)  Medication Sig   amitriptyline (ELAVIL) 75 MG tablet Take 75 mg by mouth at bedtime.   aspirin 325 MG tablet Take 325 mg by mouth daily.   betamethasone valerate ointment (VALISONE) 0.1 % Apply 1 application topically as needed.   cholecalciferol (VITAMIN D3) 25 MCG (1000 UT) tablet Take 1,000 Units by mouth daily.   cloNIDine (CATAPRES) 0.1 MG tablet Take 0.1 mg by mouth 3 (three) times daily as needed.   clotrimazole-betamethasone (LOTRISONE) cream Apply 1 application topically as needed.   cyclobenzaprine (FLEXERIL) 10 MG tablet Take 10 mg by mouth 2 (two) times daily as needed.   esomeprazole (NEXIUM) 40 MG capsule Take 40 mg by mouth as needed.   hydrALAZINE (APRESOLINE) 50 MG tablet Take 1 tablet (50 mg total) by mouth 3 (three) times daily.   hydrochlorothiazide (HYDRODIURIL) 25 MG tablet Take 1 tablet (25 mg total) by mouth daily.   Insulin Glargine (TOUJEO MAX SOLOSTAR [place]) Inject 15 Units into the skin daily.   insulin lispro protamine-lispro (HUMALOG 50/50 MIX) (50-50) 100 UNIT/ML SUSP injection Inject 5 Units into the skin 2 (two) times daily before  a meal.   JENTADUETO 2.09-998 MG TABS Take 1 tablet by mouth 2 (two) times daily.   losartan (COZAAR) 100 MG tablet Take 1 tablet (100 mg total) by mouth daily.   Magnesium 100 MG CAPS Take 2 capsules by mouth daily.   methocarbamol (ROBAXIN) 500 MG tablet Take 500 mg by mouth 2 (two) times daily as needed.   metoprolol succinate (TOPROL-XL) 50 MG 24 hr tablet TAKE 1 TABLET (50 MG TOTAL) BY MOUTH EVERY EVENING. TAKE WITH OR IMMEDIATELY FOLLOWING A MEAL.   morphine (MS CONTIN) 15 MG 12 hr tablet Take 1 tablet by mouth 2 (two) times daily.   mupirocin ointment (BACTROBAN) 2 % Apply 1 application topically as needed.   ondansetron (ZOFRAN) 4 MG tablet Take 4 mg by mouth 3 (three) times daily as needed.   Oxycodone HCl 20 MG TABS Take 20 mg by mouth 4 (four) times daily.   pregabalin (LYRICA) 200 MG capsule Take 200 mg by mouth 2 (two) times daily.   rosuvastatin (CRESTOR) 10 MG tablet Take 1 tablet by mouth daily.   sitaGLIPtin-metformin (JANUMET) 50-1000 MG tablet Take 1 tablet by mouth 2 (two) times daily.   triamcinolone cream (KENALOG) 0.5 % Apply 1 application topically as needed.   No current facility-administered medications for this visit. (Other)      REVIEW OF SYSTEMS:     ALLERGIES Allergies  Allergen Reactions   Lisinopril Hives   Metformin And Related Hives    PAST MEDICAL HISTORY  Past Medical History:  Diagnosis Date   Cataract    OU   Diabetes mellitus without complication (HCC)    Fibromyalgia    History of atrial fibrillation without current medication    [place] cardioversion   Hyperlipidemia    Hypertension    Past Surgical History:  Procedure Laterality Date   CERVICAL FUSION     ECTOPIC PREGNANCY SURGERY     LUMBAR FUSION     PARTIAL HYSTERECTOMY      FAMILY HISTORY Family History  Problem Relation Age of Onset   Atrial fibrillation Mother    Hypertension Mother    Hyperlipidemia Mother    Leukemia Father    Diabetes Brother     Hypertension Brother    Hyperlipidemia Brother    Diabetes Brother    Hyperlipidemia Brother    Hypertension Brother     SOCIAL HISTORY Social History   Tobacco Use   Smoking status: Some Days    Packs/day: 0.50    Years: 35.00    Pack years: 17.50    Types: Cigarettes    Last attempt to quit: 12/13/2020    Years since quitting: 0.6   Smokeless tobacco: Never   Tobacco comments:    Started 2 days ago again, and trying to wing herself off again.   Vaping Use   Vaping Use: Never used  Substance Use Topics   Alcohol use: Yes    Comment: occasional   Drug use: Not Currently    Types: Heroin, Cocaine, Marijuana    Comment: still uses medical marijuana occ         OPHTHALMIC EXAM:  Not recorded     IMAGING AND PROCEDURES  Imaging and Procedures for @TODAY @            ASSESSMENT/PLAN:    ICD-10-CM   1. Diabetes mellitus type 2 without retinopathy (HCC)  E11.9     2. Essential hypertension  I10     3. Hypertensive retinopathy of both eyes  H35.033     4. Combined forms of age-related cataract of both eyes  H25.813        1. Diabetes mellitus, type 2 without retinopathy  - A1c "6-something" per pt report  - The incidence, risk factors for progression, natural history and treatment options for diabetic retinopathy  were discussed with patient.    - The need for close monitoring of blood glucose, blood pressure, and serum lipids, avoiding cigarette or any type of tobacco, and the need for long term follow up was also discussed with patient.  - f/u in 1 year, sooner prn  2,3. Hypertensive retinopathy OU  - discussed importance of tight BP control  - monitor  4. 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  - discussed diagnosis and progression  - not yet visually significant  - monitor for now  - pt has not yet established primary eye care with Optometry or  Ophthalmology -- offered to help w/ referral as needed -- plans to talk to insurance carrier to find provider   Ophthalmic Meds Ordered this visit:  No orders of the defined types were placed in this encounter.      No follow-ups on file.  There are no Patient Instructions on file for this visit.   Explained the diagnoses, plan, and follow up with the patient and they expressed understanding.  Patient expressed understanding of the  importance of proper follow up care.   This document serves as a record of services personally performed by Karie Chimera, MD, PhD. It was created on their behalf by Annalee Genta, COMT. The creation of this record is the provider's dictation and/or activities during the visit.  Electronically signed by: Annalee Genta, COMT 07/20/21 2:57 PM     Karie Chimera, M.D., Ph.D. Diseases & Surgery of the Retina and Vitreous [facility] 07/21/20      Abbreviations: M myopia (nearsighted); A astigmatism; H hyperopia (farsighted); P presbyopia; Mrx spectacle prescription;  CTL contact lenses; OD right eye; OS left eye; OU both eyes  XT exotropia; ET esotropia; PEK punctate epithelial keratitis; PEE punctate epithelial erosions; DES dry eye syndrome; MGD meibomian gland dysfunction; ATs artificial tears; PFAT's preservative free artificial tears; NSC nuclear sclerotic cataract; PSC posterior subcapsular cataract; ERM epi-retinal membrane; PVD posterior vitreous detachment; RD retinal detachment; DM diabetes mellitus; DR diabetic retinopathy; NPDR non-proliferative diabetic retinopathy; PDR proliferative diabetic retinopathy; CSME clinically significant macular edema; DME diabetic macular edema; dbh dot blot hemorrhages; CWS cotton wool spot; POAG primary open angle glaucoma; C/D cup-to-disc ratio; HVF humphrey visual field; GVF goldmann visual field; OCT optical coherence tomography; IOP intraocular pressure; BRVO Branch retinal vein occlusion;  CRVO central retinal vein occlusion; CRAO central retinal artery occlusion; BRAO branch retinal artery occlusion; RT retinal tear; SB scleral buckle; PPV pars plana vitrectomy; VH Vitreous hemorrhage; PRP panretinal laser photocoagulation; IVK intravitreal kenalog; VMT vitreomacular traction; MH Macular hole;  NVD neovascularization of the disc; NVE neovascularization elsewhere; AREDS age related eye disease study; ARMD age related macular degeneration; POAG primary open angle glaucoma; EBMD epithelial/anterior basement membrane dystrophy; ACIOL anterior chamber intraocular lens; IOL intraocular lens; PCIOL posterior chamber intraocular lens; Phaco/IOL phacoemulsification with intraocular lens placement; PRK photorefractive keratectomy; LASIK laser assisted in situ keratomileusis; HTN hypertension; DM diabetes mellitus; COPD chronic obstructive pulmonary disease

## 2021-07-24 ENCOUNTER — Encounter (INDEPENDENT_AMBULATORY_CARE_PROVIDER_SITE_OTHER): Payer: Medicare Other | Admitting: Ophthalmology

## 2021-07-24 DIAGNOSIS — H35033 Hypertensive retinopathy, bilateral: Secondary | ICD-10-CM

## 2021-07-24 DIAGNOSIS — I10 Essential (primary) hypertension: Secondary | ICD-10-CM

## 2021-07-24 DIAGNOSIS — E119 Type 2 diabetes mellitus without complications: Secondary | ICD-10-CM

## 2021-07-24 DIAGNOSIS — H25813 Combined forms of age-related cataract, bilateral: Secondary | ICD-10-CM

## 2021-07-30 NOTE — Progress Notes (Deleted)
? ?  ANNUAL EXAM ?Patient name: Brittany Carroll MRN [id number]  Date of birth: 03-25-1956 ?Chief Complaint:   ?No chief complaint on file. ? ?History of Present Illness:   ?Brittany Carroll is a 66 y.o. G*** {race:25618} female being seen today for a routine annual exam.  ?Current complaints: *** ? ?No LMP recorded. Patient is postmenopausal. ? ? ?The pregnancy intention screening data noted above was reviewed. Potential methods of contraception were discussed. The patient elected to proceed with No data recorded.  ? ?Last pap None on file. H/O abnormal pap: {yes/yes***/no:23866} ?Last mammogram: 06/2020. Results were: normal. Family h/o breast cancer: no ?Last colonoscopy: ***. Results were: {normal, abnormal, n/a:23837}. Family h/o colorectal cancer: no ? ?No flowsheet data found. ? No flowsheet data found. ? ? ?Review of Systems:   ?Pertinent items are noted in HPI ?Denies any headaches, blurred vision, fatigue, shortness of breath, chest pain, abdominal pain, abnormal vaginal discharge/itching/odor/irritation, problems with periods, bowel movements, urination, or intercourse unless otherwise stated above. *** ?Pertinent History Reviewed:  ?Reviewed past medical,surgical, social and family history.  ?Reviewed problem list, medications and allergies. ?Physical Assessment:  ?There were no vitals filed for this visit.There is no height or weight on file to calculate BMI. ?  ?Physical Examination:  ?General appearance - well appearing, and in no distress ?Mental status - alert, oriented to person, place, and time ?Psych:  She has a normal mood and affect ?Skin - warm and dry, normal color, no suspicious lesions noted ?Chest - effort normal, all lung fields clear to auscultation bilaterally ?Heart - normal rate and regular rhythm ?Neck:  midline trachea, no thyromegaly or nodules ?Breasts - breasts appear normal, no suspicious masses, no skin or nipple changes or  axillary nodes ?Abdomen - soft, nontender, nondistended, no  masses or organomegaly ?Pelvic -  ?VULVA: normal appearing vulva with no masses, tenderness or lesions   ?VAGINA: normal appearing vagina with normal color and discharge, no lesions   ?CERVIX: normal appearing cervix without discharge or lesions, no CMT ?UTERUS: uterus is felt to be normal size, shape, consistency and nontender  ?ADNEXA: No adnexal masses or tenderness noted. ?Extremities:  No swelling or varicosities noted ? ?Chaperone present for exam ? ?No results found for this or any previous visit (from the past 24 hour(s)).  ?Assessment & Plan:  ?1) Well-Woman Exam ?- Cervical cancer screening: Discussed guidelines related to screening beyond 65. Based on guidelines, the patient meets criteria for cessation of pap smears.  Patient would like: {Blank single:19197::"to continue pap smears","to stop pap smears unless indicated"} ?- Breast Health: Encouraged self-breast awareness/SBE. Discussed limits of clinical breast exam for detecting breast cancer. Discussed importance of annual MXR.  Rx given for MXR ?- Bone Health: Calcium via diet and supplementation. DEXA due *** ?- Colonoscopy: {Blank single:19197::"Per PCP","up to date","declines"} ?- F/U 12 months and prn ? ?2) *** ? ?Labs/procedures today: *** ? ?Mammogram: schedule screening mammo as soon as possible, or sooner if problems ?Colonoscopy: {TCS f/u:25213::"@ 66yo"}, or sooner if problems ? ?No orders of the defined types were placed in this encounter. ? ? ?Meds: No orders of the defined types were placed in this encounter. ? ? ?Follow-up: No follow-ups on file. ? ?Milas Hock, MD ?07/30/2021 ?7:55 PM ?

## 2021-07-31 ENCOUNTER — Encounter: Payer: Medicare HMO | Admitting: Obstetrics and Gynecology

## 2021-07-31 DIAGNOSIS — Z01419 Encounter for gynecological examination (general) (routine) without abnormal findings: Secondary | ICD-10-CM

## 2021-08-04 ENCOUNTER — Other Ambulatory Visit: Payer: Self-pay

## 2021-08-04 ENCOUNTER — Ambulatory Visit: Payer: Medicare Other

## 2021-08-04 DIAGNOSIS — I10 Essential (primary) hypertension: Secondary | ICD-10-CM

## 2021-08-04 DIAGNOSIS — I701 Atherosclerosis of renal artery: Secondary | ICD-10-CM

## 2021-08-10 NOTE — Progress Notes (Signed)
Spoke to patient she is aware of results

## 2021-08-10 NOTE — Progress Notes (Signed)
Called pt no answer, left a vm

## 2021-08-15 LAB — LIPID PANEL WITH LDL/HDL RATIO
Cholesterol, Total: 152 mg/dL (ref 100–199)
HDL: 49 mg/dL (ref >39)
LDL Chol Calc (NIH): 78 mg/dL (ref 0–99)
LDL/HDL Ratio: 1.6 ratio (ref 0.0–3.2)
Triglycerides: 142 mg/dL (ref 0–149)
VLDL Cholesterol Cal: 25 mg/dL (ref 5–40)

## 2021-08-15 LAB — BASIC METABOLIC PANEL
BUN/Creatinine Ratio: 13 (ref 12–28)
BUN: 12 mg/dL (ref 8–27)
CO2: 22 mmol/L (ref 20–29)
Calcium: 8.8 mg/dL (ref 8.7–10.3)
Chloride: 99 mmol/L (ref 96–106)
Creatinine, Ser: 0.95 mg/dL (ref 0.57–1.00)
Glucose: 231 mg/dL — ABNORMAL HIGH (ref 70–99)
Potassium: 4.6 mmol/L (ref 3.5–5.2)
Sodium: 138 mmol/L (ref 134–144)
eGFR: 66 mL/min/{1.73_m2} (ref >59)

## 2021-08-28 NOTE — Progress Notes (Shared)
?[facility] - Clinic Note ? ?08/30/2021 ? ?  ? ?CHIEF COMPLAINT ?Patient presents for No chief complaint on file. ? ? ?HISTORY OF PRESENT ILLNESS: ?Brittany Carroll is a 66 y.o. female who presents to the clinic today for:  ? ? ? ?Referring physician: ?Ellyn Hack, MD ?[address] ?[place],  [place] [address] ? ?HISTORICAL INFORMATION:  ? ?Selected notes from the MEDICAL RECORD NUMBER ?Referred by PCP for DM exam  ? ?CURRENT MEDICATIONS: ?No current outpatient medications on file. (Ophthalmic Drugs)  ? ?No current facility-administered medications for this visit. (Ophthalmic Drugs)  ? ?Current Outpatient Medications (Other)  ?Medication Sig  ? amitriptyline (ELAVIL) 75 MG tablet Take 75 mg by mouth at bedtime.  ? aspirin 325 MG tablet Take 325 mg by mouth daily.  ? betamethasone valerate ointment (VALISONE) 0.1 % Apply 1 application topically as needed.  ? cholecalciferol (VITAMIN D3) 25 MCG (1000 UT) tablet Take 1,000 Units by mouth daily.  ? cloNIDine (CATAPRES) 0.1 MG tablet Take 0.1 mg by mouth 3 (three) times daily as needed.  ? clotrimazole-betamethasone (LOTRISONE) cream Apply 1 application topically as needed.  ? cyclobenzaprine (FLEXERIL) 10 MG tablet Take 10 mg by mouth 2 (two) times daily as needed.  ? esomeprazole (NEXIUM) 40 MG capsule Take 40 mg by mouth as needed.  ? HUMALOG KWIKPEN 200 UNIT/ML KwikPen SMARTSIG:5 Unit(s) SUB-Q 3 Times Daily  ? hydrALAZINE (APRESOLINE) 50 MG tablet Take 1 tablet (50 mg total) by mouth 3 (three) times daily.  ? hydrochlorothiazide (HYDRODIURIL) 25 MG tablet Take 1 tablet (25 mg total) by mouth daily.  ? Insulin Glargine (TOUJEO MAX SOLOSTAR [employer]) Inject 15 Units into the skin daily.  ? insulin lispro protamine-lispro (HUMALOG 50/50 MIX) (50-50) 100 UNIT/ML SUSP injection Inject 5 Units into the skin 2 (two) times daily before a meal.  ? JENTADUETO 2.09-998 MG TABS Take 1 tablet by mouth 2 (two) times daily.  ? losartan (COZAAR) 100 MG tablet Take 1  tablet (100 mg total) by mouth daily.  ? Magnesium 100 MG CAPS Take 2 capsules by mouth daily.  ? methocarbamol (ROBAXIN) 500 MG tablet Take 500 mg by mouth 2 (two) times daily as needed.  ? metoprolol succinate (TOPROL-XL) 50 MG 24 hr tablet TAKE 1 TABLET (50 MG TOTAL) BY MOUTH EVERY EVENING. TAKE WITH OR IMMEDIATELY FOLLOWING A MEAL.  ? morphine (MS CONTIN) 15 MG 12 hr tablet Take 1 tablet by mouth 2 (two) times daily.  ? mupirocin ointment (BACTROBAN) 2 % Apply 1 application topically as needed.  ? ondansetron (ZOFRAN) 4 MG tablet Take 4 mg by mouth 3 (three) times daily as needed.  ? Oxycodone HCl 20 MG TABS Take 20 mg by mouth 4 (four) times daily.  ? pregabalin (LYRICA) 200 MG capsule Take 200 mg by mouth 2 (two) times daily.  ? rosuvastatin (CRESTOR) 10 MG tablet Take 1 tablet by mouth daily.  ? sitaGLIPtin-metformin (JANUMET) 50-1000 MG tablet Take 1 tablet by mouth 2 (two) times daily.  ? triamcinolone cream (KENALOG) 0.5 % Apply 1 application topically as needed.  ? ?No current facility-administered medications for this visit. (Other)  ? ? ? ? ?REVIEW OF SYSTEMS: ? ? ? ? ?ALLERGIES ?Allergies  ?Allergen Reactions  ? Lisinopril Hives  ? Metformin And Related Hives  ? ? ?PAST MEDICAL HISTORY ?Past Medical History:  ?Diagnosis Date  ? Cataract   ? OU  ? Diabetes mellitus without complication (HCC)   ? Fibromyalgia   ?  History of atrial fibrillation without current medication   ? [place] in [place]version  ? Hyperlipidemia   ? Hypertension   ? ?Past Surgical History:  ?Procedure Laterality Date  ? CERVICAL FUSION    ? ECTOPIC PREGNANCY SURGERY    ? LUMBAR FUSION    ? PARTIAL HYSTERECTOMY    ? ? ?FAMILY HISTORY ?Family History  ?Problem Relation Age of Onset  ? Atrial fibrillation Mother   ? Hypertension Mother   ? Hyperlipidemia Mother   ? Leukemia Father   ? Diabetes Brother   ? Hypertension Brother   ? Hyperlipidemia Brother   ? Diabetes Brother   ? Hyperlipidemia Brother   ? Hypertension Brother    ? ? ?SOCIAL HISTORY ?Social History  ? ?Tobacco Use  ? Smoking status: Some Days  ?  Packs/day: 0.50  ?  Years: 35.00  ?  Pack years: 17.50  ?  Types: Cigarettes  ?  Last attempt to quit: 12/13/2020  ?  Years since quitting: 0.7  ? Smokeless tobacco: Never  ? Tobacco comments:  ?  Started 2 days ago again, and trying to wing herself off again.   ?Vaping Use  ? Vaping Use: Never used  ?Substance Use Topics  ? Alcohol use: Yes  ?  Comment: occasional  ? Drug use: Not Currently  ?  Types: Heroin, Cocaine, Marijuana  ?  Comment: still uses medical marijuana occ  ? ?  ? ?  ? ?OPHTHALMIC EXAM: ? ?Not recorded ?  ? ? ?IMAGING AND PROCEDURES  ?Imaging and Procedures for @TODAY @ ? ? ? ? ?  ?  ? ?  ?ASSESSMENT/PLAN: ? ?No diagnosis found. ? ? ?1,2. 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 - A1c "6-something" per pt report ? - The incidence, risk factors for progression, natural history and treatment options for diabetic retinopathy  were discussed with patient.   ? - The need for close monitoring of blood glucose, blood pressure, and serum lipids, avoiding cigarette or any type of tobacco, and the need for long term follow up was also discussed with patient. ? - f/u in 1 year, sooner PRN ? ?3,4. Hypertensive retinopathy OU ? - discussed importance of tight BP control ? - monitor ? ?5. 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 - The symptoms of cataract, surgical options, and treatments and risks were discussed with patient. ? - discussed diagnosis and progression ? - not yet visually significant ? - monitor for now ? - pt has not yet established primary eye care with Optometry or Ophthalmology -- offered to help w/ referral as needed -- plans to talk to insurance carrier to find provider ? ? ?Ophthalmic Meds Ordered this visit:  ?No orders of the defined types were placed in this encounter. ? ? ?  ? ?No follow-ups on file. ? ?There are no Patient Instructions on file for this visit. ? ? ?Explained the diagnoses, plan, and  follow up with the patient and they expressed understanding.  Patient expressed understanding of the importance of proper follow up care.  ? ?This document serves as a record of services personally performed by Karie ChimeraBrian G. Zamora, MD, PhD. It was created on their behalf by De BlanchMakenzie Rori Goar, an ophthalmic technician. The creation of this record is the provider's dictation and/or activities during the visit.   ? ?Electronically signed by: De BlanchMakenzie Bryceton Hantz, OA, 08/28/21  11:54 AM ? ? ? ?Karie ChimeraBrian G. Zamora, M.D., Ph.D. ?Diseases & Surgery of the Retina and Vitreous ?[facility] ?07/21/20 ? ?I have reviewed the above documentation for accuracy and completeness, and I agree with the above. Karie Chimera, M.D., Ph.D. 07/21/20 11:53 AM ? ? ? ?Abbreviations: ?M myopia (nearsighted); A astigmatism; H hyperopia (farsighted); P presbyopia; Mrx spectacle prescription;  CTL contact lenses; OD right eye; OS left eye; OU both eyes  XT exotropia; ET esotropia; PEK punctate epithelial keratitis; PEE punctate epithelial erosions; DES dry eye syndrome; MGD meibomian gland dysfunction; ATs artificial tears; PFAT's preservative free artificial tears; NSC nuclear sclerotic cataract; PSC posterior subcapsular cataract; ERM epi-retinal membrane; PVD posterior vitreous detachment; RD retinal detachment; DM diabetes mellitus; DR diabetic retinopathy; NPDR non-proliferative diabetic retinopathy; PDR proliferative diabetic retinopathy; CSME clinically significant macular edema; DME diabetic macular edema; dbh dot blot hemorrhages; CWS cotton wool spot; POAG primary open angle glaucoma; C/D cup-to-disc ratio; HVF humphrey visual field; GVF goldmann visual field; OCT optical coherence tomography; IOP intraocular pressure; BRVO Branch retinal vein occlusion; CRVO central retinal vein occlusion; CRAO central retinal artery occlusion; BRAO branch retinal artery occlusion; RT retinal tear; SB scleral buckle; PPV pars plana  vitrectomy; VH Vitreous hemorrhage; PRP panretinal laser photocoagulation; IVK intravitreal kenalog; VMT vitreomacular traction; MH Macular hole;  NVD neovascularization of the disc; NVE neovascularization elsewhere; AR

## 2021-08-30 ENCOUNTER — Encounter (INDEPENDENT_AMBULATORY_CARE_PROVIDER_SITE_OTHER): Payer: Medicare Other | Admitting: Ophthalmology

## 2021-08-30 DIAGNOSIS — E119 Type 2 diabetes mellitus without complications: Secondary | ICD-10-CM

## 2021-08-30 DIAGNOSIS — H35033 Hypertensive retinopathy, bilateral: Secondary | ICD-10-CM

## 2021-08-30 DIAGNOSIS — H25813 Combined forms of age-related cataract, bilateral: Secondary | ICD-10-CM

## 2021-08-30 DIAGNOSIS — I10 Essential (primary) hypertension: Secondary | ICD-10-CM

## 2021-09-18 ENCOUNTER — Ambulatory Visit: Payer: Medicare Other | Admitting: Student

## 2021-09-18 ENCOUNTER — Encounter: Payer: Self-pay | Admitting: Student

## 2021-09-18 VITALS — BP 170/83 | HR 72 | Temp 98.0°F | Resp 17 | Ht 67.0 in | Wt 214.8 lb

## 2021-09-18 DIAGNOSIS — I701 Atherosclerosis of renal artery: Secondary | ICD-10-CM

## 2021-09-18 DIAGNOSIS — I700 Atherosclerosis of aorta: Secondary | ICD-10-CM

## 2021-09-18 DIAGNOSIS — I10 Essential (primary) hypertension: Secondary | ICD-10-CM

## 2021-09-18 DIAGNOSIS — E782 Mixed hyperlipidemia: Secondary | ICD-10-CM

## 2021-09-18 NOTE — H&P (View-Only) (Signed)
? ?Primary Physician/Referring:  Brittany Nova, MD ? ?Patient ID: Brittany Carroll, female    DOB: 06/13/1955, 66 y.o.   MRN: [id number] ? ?Chief Complaint  ?Patient presents with  ? Follow-up  ?  3 MONTH  ? PRIMARY HYPERTENSION  ? ?HPI:   ? ?Brittany Carroll  is a 66 y.o. Female patient with history of atrial fibrillation S/P Cardioversion in 2008 without recurrence, hypertension, mixed hyperlipidemia, diabetes mellitus, tobacco use, hyperthyroidism previously treated with methimazole, fibromyalgia and chronic pain.  Patient was seen in 2021 for consultation regarding remote atrial fibrillation.  ? ?Patient presents for 66-monthfollow-up.  [name] BMP remained stable following addition of hydrochlorothiazide.  Her last office visit added hydralazine 50 mg 3 times daily and ordered renal artery duplex.  Patient is also enrolled in remote patient monitoring.  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  Lipids are under control with LDL 78.  ? ?[name] blood pressure remains uncontrolled, however she is asymptomatic.  Denies chest pain, dyspnea, symptoms suggestive of TIA or CVA. ? ?Notably patient has been unable to tolerate amlodipine due to hives. ? ?Past Medical History:  ?Diagnosis Date  ? Cataract   ? OU  ? Diabetes mellitus without complication ([place]   ? Fibromyalgia   ? History of atrial fibrillation without current medication   ? [place]  ? Hyperlipidemia   ? Hypertension   ? ?Past Surgical History:  ?Procedure Laterality Date  ? CERVICAL FUSION    ? ECTOPIC PREGNANCY SURGERY    ? LUMBAR FUSION    ? PARTIAL HYSTERECTOMY    ? ?Family History  ?Problem Relation Age of Onset  ? Atrial fibrillation Mother   ? Hypertension Mother   ? Hyperlipidemia Mother   ? Leukemia Father   ? Diabetes Brother   ? Hypertension Brother   ? Hyperlipidemia Brother   ? Diabetes Brother   ? Hyperlipidemia Brother   ? Hypertension  Brother   ? ?Social History  ? ?Tobacco Use  ? Smoking status: Some Days  ?  Packs/day: 0.50  ?  Years: 35.00  ?  Pack years: 17.50  ?  Types: Cigarettes  ?  Last attempt to quit: 12/13/2020  ?  Years since quitting: 0.7  ? Smokeless tobacco: Never  ? Tobacco comments:  ?  Started 2 days ago again, and trying to wing herself off again.   ?Substance Use Topics  ? Alcohol use: Yes  ?  Comment: occasional  ? ?ROS  ?Review of Systems  ?Constitutional: Negative for malaise/fatigue and weight gain.  ?Eyes:  Negative for visual disturbance.  ?Cardiovascular:  Negative for chest pain, claudication, dyspnea on exertion, leg swelling, near-syncope, orthopnea, palpitations, paroxysmal nocturnal dyspnea and syncope.  ?Respiratory:  Negative for shortness of breath.   ?Hematologic/Lymphatic: Does not bruise/bleed easily.  ?Neurological:  Negative for dizziness.  ? ?Objective  ?Blood pressure (!) 170/83, pulse 72, temperature 98 ?F (36.7 ?C), temperature source Temporal, resp. rate 17, height 5' 7" (1.702 m), weight 214 lb 12.8 oz (97.4 kg), SpO2 98 %.  ? ?  09/18/2021  ?  1:42 PM 09/18/2021  ?  1:31 PM 07/14/2021  ? 11:34 AM  ?Vitals with BMI  ?Height  5' 7"   ?Weight  214 lbs 13 oz   ?BMI  33.63   ?Systolic [id number] ?Diastolic 83 83 75  ?Pulse 72 74 91  ?  ?  Physical Exam ?Vitals reviewed.  ?Constitutional:   ?   Appearance: She is well-developed. She is obese.  ?Neck:  ?   Thyroid: No thyromegaly.  ?Cardiovascular:  ?   Rate and Rhythm: Normal rate and regular rhythm.  ?   Pulses: Intact distal pulses.     ?     Carotid pulses are 2+ on the right side and 2+ on the left side. ?     Radial pulses are 2+ on the right side and 2+ on the left side.  ?     Dorsalis pedis pulses are 2+ on the right side and 2+ on the left side.  ?   Heart sounds: Normal heart sounds, S1 normal and S2 normal. No murmur heard. ?  No gallop.  ?   Comments: No leg edema, no JVD. ?Pulmonary:  ?   Effort: Pulmonary effort is normal. No respiratory  distress.  ?   Breath sounds: Normal breath sounds. No wheezing, rhonchi or rales.  ?Musculoskeletal:  ?   Right lower leg: No edema.  ?   Left lower leg: No edema.  ?Skin: ?   General: Skin is warm and dry.  ?Neurological:  ?   General: No focal deficit present.  ?   Mental Status: She is oriented to person, place, and time.  ?   Cranial Nerves: No cranial nerve deficit.  ?Physical exam unchanged compared to previous office visit. ? ?Laboratory examination:  ? ? ?  Latest Ref Rng & Units 08/14/2021  ? 12:10 PM 01/25/2021  ?  1:02 PM  ?CMP  ?Glucose 70 - 99 mg/dL 231   160    ?BUN 8 - 27 mg/dL 12   12    ?Creatinine 0.57 - 1.00 mg/dL 0.95   0.88    ?Sodium 134 - 144 mmol/L 138   139    ?Potassium 3.5 - 5.2 mmol/L 4.6   4.7    ?Chloride 96 - 106 mmol/L 99   102    ?CO2 20 - 29 mmol/L 22   23    ?Calcium 8.7 - 10.3 mg/dL 8.8   9.2    ? ?   ? View : No data to display.  ?  ?  ?  ? ?Lipid Panel  ?   ?Component Value Date/Time  ? CHOL 152 08/14/2021 1210  ? TRIG 142 08/14/2021 1210  ? HDL 49 08/14/2021 1210  ? [employer] 78 08/14/2021 1210  ? ?HEMOGLOBIN A1C ?No results found for: HGBA1C, MPG ?TSH ?No results for input(s): TSH in the last 8760 hours. ? ?External labs: ?11/09/2020: ?Hemoglobin 8.8, hematocrit 25.8, MCV 76.5, platelet 197 ?Magnesium 2.0 ?A1c 7.0% ?Sodium 138, potassium 4.1, BUN 19, creatinine 0.68, GFR >90 ? ?05/08/2019: Serum glucose 157 mg, BUN 11, creatinine 0.8, eGFR >72 mL.  Potassium 4.8.  CMP normal.  CBC normal with minimal microcytic index. ? ?Total cholesterol 188, triglycerides 172, HDL 57, LDL 97.  Non-HDL cholesterol 131. ? ?TSH normal.  Vitamin D mildly reduced at 28.5.  A1c 6.1%.  ?  ?Allergies  ? ?Allergies  ?Allergen Reactions  ? Lisinopril Hives  ? Metformin And Related Hives  ?  ?Medications Prior to Visit:  ? ?Outpatient Medications Prior to Visit  ?Medication Sig Dispense Refill  ? amitriptyline (ELAVIL) 75 MG tablet Take 75 mg by mouth at bedtime.    ? aspirin 325 MG tablet Take 325 mg by  mouth daily.    ? betamethasone valerate ointment (VALISONE) 0.1 % Apply  1 application topically as needed.    ? cholecalciferol (VITAMIN D3) 25 MCG (1000 UT) tablet Take 1,000 Units by mouth daily.    ? cloNIDine (CATAPRES) 0.1 MG tablet Take 0.1 mg by mouth 3 (three) times daily as needed.    ? cyclobenzaprine (FLEXERIL) 10 MG tablet Take 10 mg by mouth 2 (two) times daily as needed.    ? esomeprazole (NEXIUM) 40 MG capsule Take 40 mg by mouth as needed.    ? HUMALOG KWIKPEN 200 UNIT/ML KwikPen SMARTSIG:5 Unit(s) SUB-Q 3 Times Daily    ? hydrALAZINE (APRESOLINE) 50 MG tablet Take 1 tablet (50 mg total) by mouth 3 (three) times daily. 270 tablet 3  ? hydrochlorothiazide (HYDRODIURIL) 25 MG tablet Take 1 tablet (25 mg total) by mouth daily. 90 tablet 3  ? Insulin Glargine (TOUJEO MAX SOLOSTAR [place]) Inject 15 Units into the skin daily.    ? insulin lispro protamine-lispro (HUMALOG 50/50 MIX) (50-50) 100 UNIT/ML SUSP injection Inject 5 Units into the skin 2 (two) times daily before a meal.    ? JENTADUETO 2.09-998 MG TABS Take 1 tablet by mouth 2 (two) times daily.    ? losartan (COZAAR) 100 MG tablet Take 1 tablet (100 mg total) by mouth daily. 90 tablet 0  ? Magnesium 100 MG CAPS Take 2 capsules by mouth daily.    ? methocarbamol (ROBAXIN) 500 MG tablet Take 500 mg by mouth 2 (two) times daily as needed.    ? metoprolol succinate (TOPROL-XL) 50 MG 24 hr tablet TAKE 1 TABLET (50 MG TOTAL) BY MOUTH EVERY EVENING. TAKE WITH OR IMMEDIATELY FOLLOWING A MEAL. 90 tablet 0  ? morphine (MS CONTIN) 15 MG 12 hr tablet Take 1 tablet by mouth 2 (two) times daily.    ? ondansetron (ZOFRAN) 4 MG tablet Take 4 mg by mouth 3 (three) times daily as needed.    ? Oxycodone HCl 20 MG TABS Take 20 mg by mouth 4 (four) times daily.    ? pregabalin (LYRICA) 200 MG capsule Take 200 mg by mouth 2 (two) times daily.    ? rosuvastatin (CRESTOR) 10 MG tablet Take 1 tablet by mouth daily.    ? sitaGLIPtin-metformin (JANUMET) 50-1000 MG tablet Take  1 tablet by mouth 2 (two) times daily.    ? clotrimazole-betamethasone (LOTRISONE) cream Apply 1 application topically as needed.    ? mupirocin ointment (BACTROBAN) 2 % Apply 1 application topically as needed.

## 2021-09-18 NOTE — Progress Notes (Signed)
? ?Primary Physician/Referring:  Shah, Syed Asad A, MD ? ?Patient ID: Brittany Carroll, female    DOB: 04/22/1956, 65 y.o.   MRN: [id number] ? ?Chief Complaint  ?Patient presents with  ? Follow-up  ?  3 MONTH  ? PRIMARY HYPERTENSION  ? ?HPI:   ? ?Brittany Carroll  is a 65 y.o. Female patient with history of atrial fibrillation S/P Cardioversion in 2008 without recurrence, hypertension, mixed hyperlipidemia, diabetes mellitus, tobacco use, hyperthyroidism previously treated with methimazole, fibromyalgia and chronic pain.  Patient was seen in 2021 for consultation regarding remote atrial fibrillation.  ? ?Patient presents for 3-month follow-up.  [name] BMP remained stable following addition of hydrochlorothiazide.  Her last office visit added hydralazine 50 mg 3 times daily and ordered renal artery duplex.  Patient is also enrolled in remote patient monitoring.  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  Lipids are under control with LDL 78.  ? ?[name] blood pressure remains uncontrolled, however she is asymptomatic.  Denies chest pain, dyspnea, symptoms suggestive of TIA or CVA. ? ?Notably patient has been unable to tolerate amlodipine due to hives. ? ?Past Medical History:  ?Diagnosis Date  ? Cataract   ? OU  ? Diabetes mellitus without complication (HCC)   ? Fibromyalgia   ? History of atrial fibrillation without current medication   ? 2008 in [place] had cardioversion  ? Hyperlipidemia   ? Hypertension   ? ?Past Surgical History:  ?Procedure Laterality Date  ? CERVICAL FUSION    ? ECTOPIC PREGNANCY SURGERY    ? LUMBAR FUSION    ? PARTIAL HYSTERECTOMY    ? ?Family History  ?Problem Relation Age of Onset  ? Atrial fibrillation Mother   ? Hypertension Mother   ? Hyperlipidemia Mother   ? Leukemia Father   ? Diabetes Brother   ? Hypertension Brother   ? Hyperlipidemia Brother   ? Diabetes Brother   ? Hyperlipidemia Brother   ? Hypertension  Brother   ? ?Social History  ? ?Tobacco Use  ? Smoking status: Some Days  ?  Packs/day: 0.50  ?  Years: 35.00  ?  Pack years: 17.50  ?  Types: Cigarettes  ?  Last attempt to quit: 12/13/2020  ?  Years since quitting: 0.7  ? Smokeless tobacco: Never  ? Tobacco comments:  ?  Started 2 days ago again, and trying to wing herself off again.   ?Substance Use Topics  ? Alcohol use: Yes  ?  Comment: occasional  ? ?ROS  ?Review of Systems  ?Constitutional: Negative for malaise/fatigue and weight gain.  ?Eyes:  Negative for visual disturbance.  ?Cardiovascular:  Negative for chest pain, claudication, dyspnea on exertion, leg swelling, near-syncope, orthopnea, palpitations, paroxysmal nocturnal dyspnea and syncope.  ?Respiratory:  Negative for shortness of breath.   ?Hematologic/Lymphatic: Does not bruise/bleed easily.  ?Neurological:  Negative for dizziness.  ? ?Objective  ?Blood pressure (!) 170/83, pulse 72, temperature 98 ?F (36.7 ?C), temperature source Temporal, resp. rate 17, height 5' 7" (1.702 m), weight 214 lb 12.8 oz (97.4 kg), SpO2 98 %.  ? ?  09/18/2021  ?  1:42 PM 09/18/2021  ?  1:31 PM 07/14/2021  ? 11:34 AM  ?Vitals with BMI  ?Height  5' 7"   ?Weight  214 lbs 13 oz   ?BMI  33.63   ?Systolic 170 168 116  ?Diastolic 83 83 75  ?Pulse 72 74 91  ?  ?   Physical Exam ?Vitals reviewed.  ?Constitutional:   ?   Appearance: She is well-developed. She is obese.  ?Neck:  ?   Thyroid: No thyromegaly.  ?Cardiovascular:  ?   Rate and Rhythm: Normal rate and regular rhythm.  ?   Pulses: Intact distal pulses.     ?     Carotid pulses are 2+ on the right side and 2+ on the left side. ?     Radial pulses are 2+ on the right side and 2+ on the left side.  ?     Dorsalis pedis pulses are 2+ on the right side and 2+ on the left side.  ?   Heart sounds: Normal heart sounds, S1 normal and S2 normal. No murmur heard. ?  No gallop.  ?   Comments: No leg edema, no JVD. ?Pulmonary:  ?   Effort: Pulmonary effort is normal. No respiratory  distress.  ?   Breath sounds: Normal breath sounds. No wheezing, rhonchi or rales.  ?Musculoskeletal:  ?   Right lower leg: No edema.  ?   Left lower leg: No edema.  ?Skin: ?   General: Skin is warm and dry.  ?Neurological:  ?   General: No focal deficit present.  ?   Mental Status: She is oriented to person, place, and time.  ?   Cranial Nerves: No cranial nerve deficit.  ?Physical exam unchanged compared to previous office visit. ? ?Laboratory examination:  ? ? ?  Latest Ref Rng & Units 08/14/2021  ? 12:10 PM 01/25/2021  ?  1:02 PM  ?CMP  ?Glucose 70 - 99 mg/dL 231   160    ?BUN 8 - 27 mg/dL 12   12    ?Creatinine 0.57 - 1.00 mg/dL 0.95   0.88    ?Sodium 134 - 144 mmol/L 138   139    ?Potassium 3.5 - 5.2 mmol/L 4.6   4.7    ?Chloride 96 - 106 mmol/L 99   102    ?CO2 20 - 29 mmol/L 22   23    ?Calcium 8.7 - 10.3 mg/dL 8.8   9.2    ? ?   ? View : No data to display.  ?  ?  ?  ? ?Lipid Panel  ?   ?Component Value Date/Time  ? CHOL 152 08/14/2021 1210  ? TRIG 142 08/14/2021 1210  ? HDL 49 08/14/2021 1210  ? [employer] 78 08/14/2021 1210  ? ?HEMOGLOBIN A1C ?No results found for: HGBA1C, MPG ?TSH ?No results for input(s): TSH in the last 8760 hours. ? ?External labs: ?11/09/2020: ?Hemoglobin 8.8, hematocrit 25.8, MCV 76.5, platelet 197 ?Magnesium 2.0 ?A1c 7.0% ?Sodium 138, potassium 4.1, BUN 19, creatinine 0.68, GFR >90 ? ?05/08/2019: Serum glucose 157 mg, BUN 11, creatinine 0.8, eGFR >72 mL.  Potassium 4.8.  CMP normal.  CBC normal with minimal microcytic index. ? ?Total cholesterol 188, triglycerides 172, HDL 57, LDL 97.  Non-HDL cholesterol 131. ? ?TSH normal.  Vitamin D mildly reduced at 28.5.  A1c 6.1%.  ?  ?Allergies  ? ?Allergies  ?Allergen Reactions  ? Lisinopril Hives  ? Metformin And Related Hives  ?  ?Medications Prior to Visit:  ? ?Outpatient Medications Prior to Visit  ?Medication Sig Dispense Refill  ? amitriptyline (ELAVIL) 75 MG tablet Take 75 mg by mouth at bedtime.    ? aspirin 325 MG tablet Take 325 mg by  mouth daily.    ? betamethasone valerate ointment (VALISONE) 0.1 % Apply  1 application topically as needed.    ? cholecalciferol (VITAMIN D3) 25 MCG (1000 UT) tablet Take 1,000 Units by mouth daily.    ? cloNIDine (CATAPRES) 0.1 MG tablet Take 0.1 mg by mouth 3 (three) times daily as needed.    ? cyclobenzaprine (FLEXERIL) 10 MG tablet Take 10 mg by mouth 2 (two) times daily as needed.    ? esomeprazole (NEXIUM) 40 MG capsule Take 40 mg by mouth as needed.    ? HUMALOG KWIKPEN 200 UNIT/ML KwikPen SMARTSIG:5 Unit(s) SUB-Q 3 Times Daily    ? hydrALAZINE (APRESOLINE) 50 MG tablet Take 1 tablet (50 mg total) by mouth 3 (three) times daily. 270 tablet 3  ? hydrochlorothiazide (HYDRODIURIL) 25 MG tablet Take 1 tablet (25 mg total) by mouth daily. 90 tablet 3  ? Insulin Glargine (TOUJEO MAX SOLOSTAR [place]) Inject 15 Units into the skin daily.    ? insulin lispro protamine-lispro (HUMALOG 50/50 MIX) (50-50) 100 UNIT/ML SUSP injection Inject 5 Units into the skin 2 (two) times daily before a meal.    ? JENTADUETO 2.09-998 MG TABS Take 1 tablet by mouth 2 (two) times daily.    ? losartan (COZAAR) 100 MG tablet Take 1 tablet (100 mg total) by mouth daily. 90 tablet 0  ? Magnesium 100 MG CAPS Take 2 capsules by mouth daily.    ? methocarbamol (ROBAXIN) 500 MG tablet Take 500 mg by mouth 2 (two) times daily as needed.    ? metoprolol succinate (TOPROL-XL) 50 MG 24 hr tablet TAKE 1 TABLET (50 MG TOTAL) BY MOUTH EVERY EVENING. TAKE WITH OR IMMEDIATELY FOLLOWING A MEAL. 90 tablet 0  ? morphine (MS CONTIN) 15 MG 12 hr tablet Take 1 tablet by mouth 2 (two) times daily.    ? ondansetron (ZOFRAN) 4 MG tablet Take 4 mg by mouth 3 (three) times daily as needed.    ? Oxycodone HCl 20 MG TABS Take 20 mg by mouth 4 (four) times daily.    ? pregabalin (LYRICA) 200 MG capsule Take 200 mg by mouth 2 (two) times daily.    ? rosuvastatin (CRESTOR) 10 MG tablet Take 1 tablet by mouth daily.    ? sitaGLIPtin-metformin (JANUMET) 50-1000 MG tablet Take  1 tablet by mouth 2 (two) times daily.    ? clotrimazole-betamethasone (LOTRISONE) cream Apply 1 application topically as needed.    ? mupirocin ointment (BACTROBAN) 2 % Apply 1 application topically as needed.

## 2021-09-28 LAB — BASIC METABOLIC PANEL
BUN/Creatinine Ratio: 14 (ref 12–28)
BUN: 11 mg/dL (ref 8–27)
CO2: 23 mmol/L (ref 20–29)
Calcium: 9.4 mg/dL (ref 8.7–10.3)
Chloride: 101 mmol/L (ref 96–106)
Creatinine, Ser: 0.78 mg/dL (ref 0.57–1.00)
Glucose: 184 mg/dL — ABNORMAL HIGH (ref 70–99)
Potassium: 4 mmol/L (ref 3.5–5.2)
Sodium: 140 mmol/L (ref 134–144)
eGFR: 84 mL/min/{1.73_m2} (ref >59)

## 2021-09-28 LAB — CBC
Hematocrit: 34.6 % (ref 34.0–46.6)
Hemoglobin: 11.4 g/dL (ref 11.1–15.9)
MCH: 24.1 pg — ABNORMAL LOW (ref 26.6–33.0)
MCHC: 32.9 g/dL (ref 31.5–35.7)
MCV: 73 fL — ABNORMAL LOW (ref 79–97)
Platelets: 238 10*3/uL (ref 150–450)
RBC: 4.74 x10E6/uL (ref 3.77–5.28)
RDW: 15.7 % — ABNORMAL HIGH (ref 11.7–15.4)
WBC: 7.8 10*3/uL (ref 3.4–10.8)

## 2021-10-03 ENCOUNTER — Ambulatory Visit (HOSPITAL_COMMUNITY): Admission: RE | Admit: 2021-10-03 | Payer: Medicare Other | Source: Ambulatory Visit | Admitting: Cardiology

## 2021-10-03 ENCOUNTER — Ambulatory Visit (HOSPITAL_COMMUNITY)
Admission: RE | Admit: 2021-10-03 | Discharge: 2021-10-03 | Disposition: A | Discharge status: Home / Self Care | Payer: Medicare Other | Source: Ambulatory Visit | Attending: Cardiology | Admitting: Cardiology

## 2021-10-03 ENCOUNTER — Other Ambulatory Visit: Payer: Self-pay

## 2021-10-03 ENCOUNTER — Encounter (HOSPITAL_COMMUNITY)
Admission: RE | Disposition: A | Discharge status: Home / Self Care | Payer: Self-pay | Source: Ambulatory Visit | Attending: Cardiology

## 2021-10-03 DIAGNOSIS — I700 Atherosclerosis of aorta: Secondary | ICD-10-CM | POA: Insufficient documentation

## 2021-10-03 DIAGNOSIS — I701 Atherosclerosis of renal artery: Secondary | ICD-10-CM | POA: Insufficient documentation

## 2021-10-03 DIAGNOSIS — I10 Essential (primary) hypertension: Secondary | ICD-10-CM | POA: Insufficient documentation

## 2021-10-03 DIAGNOSIS — Z7984 Long term (current) use of oral hypoglycemic drugs: Secondary | ICD-10-CM | POA: Insufficient documentation

## 2021-10-03 DIAGNOSIS — Z87891 Personal history of nicotine dependence: Secondary | ICD-10-CM | POA: Diagnosis not present

## 2021-10-03 DIAGNOSIS — E119 Type 2 diabetes mellitus without complications: Secondary | ICD-10-CM | POA: Insufficient documentation

## 2021-10-03 DIAGNOSIS — R93429 Abnormal radiologic findings on diagnostic imaging of unspecified kidney: Secondary | ICD-10-CM

## 2021-10-03 DIAGNOSIS — M797 Fibromyalgia: Secondary | ICD-10-CM | POA: Insufficient documentation

## 2021-10-03 DIAGNOSIS — Z794 Long term (current) use of insulin: Secondary | ICD-10-CM | POA: Diagnosis not present

## 2021-10-03 DIAGNOSIS — E782 Mixed hyperlipidemia: Secondary | ICD-10-CM | POA: Insufficient documentation

## 2021-10-03 DIAGNOSIS — I1A0 Resistant hypertension: Secondary | ICD-10-CM

## 2021-10-03 HISTORY — PX: RENAL ANGIOGRAPHY: CATH118260

## 2021-10-03 LAB — GLUCOSE, CAPILLARY: Glucose-Capillary: 137 mg/dL — ABNORMAL HIGH (ref 70–99)

## 2021-10-03 SURGERY — RENAL ANGIOGRAPHY
Anesthesia: LOCAL

## 2021-10-03 MED ORDER — SODIUM CHLORIDE 0.9% FLUSH: 3.0000 mL | INTRAVENOUS | Status: DC | PRN

## 2021-10-03 MED ORDER — MIDAZOLAM HCL 2 MG/2ML IJ SOLN
INTRAMUSCULAR | Status: AC
  Filled 2021-10-03: qty 2

## 2021-10-03 MED ORDER — SODIUM CHLORIDE 0.9 % IV SOLN
INTRAVENOUS | Status: DC | PRN
  Administered 2021-10-03: 10 mL/h via INTRAVENOUS

## 2021-10-03 MED ORDER — CLONIDINE HCL 0.2 MG/24HR TD PTWK: 0.2000 mg | MEDICATED_PATCH | TRANSDERMAL | 3 refills | Status: DC

## 2021-10-03 MED ORDER — CLONIDINE HCL 0.2 MG PO TABS: 0.2000 mg | ORAL_TABLET | ORAL | Status: DC | PRN

## 2021-10-03 MED ORDER — LABETALOL HCL 5 MG/ML IV SOLN
INTRAVENOUS | Status: AC
  Filled 2021-10-03: qty 4

## 2021-10-03 MED ORDER — ONDANSETRON HCL 4 MG/2ML IJ SOLN
4.0000 mg | Freq: Four times a day (QID) | INTRAMUSCULAR | Status: DC | PRN
Start: 2021-10-03 — End: 2021-10-03

## 2021-10-03 MED ORDER — LIDOCAINE HCL (PF) 1 % IJ SOLN
INTRAMUSCULAR | Status: DC | PRN
  Administered 2021-10-03: 20 mL

## 2021-10-03 MED ORDER — HYDRALAZINE HCL 20 MG/ML IJ SOLN
INTRAMUSCULAR | Status: AC
  Filled 2021-10-03: qty 1

## 2021-10-03 MED ORDER — SODIUM CHLORIDE 0.9 % IV SOLN
250.0000 mL | INTRAVENOUS | Status: DC | PRN
Start: 2021-10-03 — End: 2021-10-03

## 2021-10-03 MED ORDER — LIDOCAINE HCL (PF) 1 % IJ SOLN
INTRAMUSCULAR | Status: AC
  Filled 2021-10-03: qty 30

## 2021-10-03 MED ORDER — HEPARIN (PORCINE) IN NACL 1000-0.9 UT/500ML-% IV SOLN
INTRAVENOUS | Status: AC
  Filled 2021-10-03: qty 1000

## 2021-10-03 MED ORDER — HYDRALAZINE HCL 20 MG/ML IJ SOLN
INTRAMUSCULAR | Status: DC | PRN
  Administered 2021-10-03: 10 mg via INTRAVENOUS

## 2021-10-03 MED ORDER — FENTANYL CITRATE (PF) 100 MCG/2ML IJ SOLN
INTRAMUSCULAR | Status: AC
  Filled 2021-10-03: qty 2

## 2021-10-03 MED ORDER — MIDAZOLAM HCL 2 MG/2ML IJ SOLN
INTRAMUSCULAR | Status: DC | PRN
  Administered 2021-10-03 (×2): 1 mg via INTRAVENOUS

## 2021-10-03 MED ORDER — ACETAMINOPHEN 325 MG PO TABS
650.0000 mg | ORAL_TABLET | ORAL | Status: DC | PRN
  Filled 2021-10-03: qty 2

## 2021-10-03 MED ORDER — LABETALOL HCL 5 MG/ML IV SOLN
INTRAVENOUS | Status: DC | PRN
  Administered 2021-10-03: 15 mg via INTRAVENOUS

## 2021-10-03 MED ORDER — SODIUM CHLORIDE 0.9% FLUSH: 3.0000 mL | Freq: Two times a day (BID) | INTRAVENOUS | Status: DC

## 2021-10-03 MED ORDER — HEPARIN (PORCINE) IN NACL 1000-0.9 UT/500ML-% IV SOLN
INTRAVENOUS | Status: DC | PRN
  Administered 2021-10-03 (×2): 500 mL

## 2021-10-03 MED ORDER — FENTANYL CITRATE (PF) 100 MCG/2ML IJ SOLN
INTRAMUSCULAR | Status: DC | PRN
  Administered 2021-10-03 (×2): 50 ug via INTRAVENOUS

## 2021-10-03 MED ORDER — IODIXANOL 320 MG/ML IV SOLN
INTRAVENOUS | Status: DC | PRN
Start: 2021-10-03 — End: 2021-10-03
  Administered 2021-10-03: 45 mL via INTRA_ARTERIAL

## 2021-10-03 SURGICAL SUPPLY — 10 items
CATH ANGIO 5F BER2 65CM (CATHETERS) ×1 IMPLANT
CATH CROSS OVER TEMPO 5F (CATHETERS) ×1 IMPLANT
CLOSURE PERCLOSE PROSTYLE (VASCULAR PRODUCTS) ×1 IMPLANT
KIT MICROPUNCTURE NIT STIFF (SHEATH) ×1 IMPLANT
KIT PV (KITS) ×2 IMPLANT
SHEATH PINNACLE 6F 10CM (SHEATH) ×1 IMPLANT
SHEATH PROBE COVER 6X72 (BAG) ×1 IMPLANT
TRANSDUCER W/STOPCOCK (MISCELLANEOUS) ×2 IMPLANT
TRAY PV CATH (CUSTOM PROCEDURE TRAY) ×2 IMPLANT
WIRE J 3MM .035X145CM (WIRE) ×1 IMPLANT

## 2021-10-03 NOTE — Discharge Instructions (Signed)
Femoral Site Care This sheet gives you information about how to care for yourself after your procedure. Your health care provider may also give you more specific instructions. If you have problems or questions, contact your health care provider. What can I expect after the procedure? After the procedure, it is common to have: Bruising that usually fades within 1-2 weeks. Tenderness at the site. Follow these instructions at home: Wound care May remove bandage after 24 hours. Do not take baths, swim, or use a hot tub for 5 days. You may shower 24-48 hours after the procedure. Gently wash the site with plain soap and water. Pat the area dry with a clean towel. Do not rub the site. This may cause bleeding. Do not apply powder or lotion to the site. Keep the site clean and dry. Check your femoral site every day for signs of infection. Check for: Redness, swelling, or pain. Fluid or blood. Warmth. Pus or a bad smell. Activity For the first 2-3 days after your procedure, or as long as directed: Avoid climbing stairs as much as possible. Do not squat. Do not lift, push or pull anything that is heavier than 10 lb for 5 days. Rest as directed. Avoid sitting for a long time without moving. Get up to take short walks every 1-2 hours. Do not drive for 24 hours. General instructions Take over-the-counter and prescription medicines only as told by your health care provider. Keep all follow-up visits as told by your health care provider. This is important. DRINK PLENTY OF FLUIDS FOR THE NEXT 2-3 DAYS. Contact a health care provider if you have: A fever or chills. You have redness, swelling, or pain around your insertion site. Get help right away if: The catheter insertion area swells very fast. You pass out. You suddenly start to sweat or your skin gets clammy. The catheter insertion area is bleeding, and the bleeding does not stop when you hold steady pressure on the area. The area near or  just beyond the catheter insertion site becomes pale, cool, tingly, or numb. These symptoms may represent a serious problem that is an emergency. Do not wait to see if the symptoms will go away. Get medical help right away. Call your local emergency services (911 in the [place] Do not drive yourself to the hospital. Summary After the procedure, it is common to have bruising that usually fades within 1-2 weeks. Check your femoral site every day for signs of infection. Do not lift, push or pull anything that is heavier than 10 lb for 5 days.  This information is not intended to replace advice given to you by your health care provider. Make sure you discuss any questions you have with your health care provider. Document Revised: 05/27/2017 Document Reviewed: 05/27/2017 Elsevier Patient Education  2020 [employer] Inc[employer]  

## 2021-10-03 NOTE — Interval H&P Note (Signed)
History and Physical Interval Note: ? ?10/03/2021 ?12:55 PM ? ?Brittany Carroll  has presented today for surgery, with the diagnosis of renal stenosis.  The various methods of treatment have been discussed with the patient and family. After consideration of risks, benefits and other options for treatment, the patient has consented to  Procedure(s): ?RENAL ANGIOGRAPHY (N/A) and possible angioplasty as a surgical intervention.  The patient's history has been reviewed, patient examined, no change in status, stable for surgery.  I have reviewed the patient's chart and labs.  Questions were answered to the patient's satisfaction.   ? ? ?Yates Decamp ? ? ?

## 2021-10-03 NOTE — Progress Notes (Signed)
Pt ambulated without difficulty or bleeding.   Discharged home with granddaughter who will drive and stay with pt x 24 hrs  

## 2021-10-04 ENCOUNTER — Encounter (HOSPITAL_COMMUNITY): Payer: Self-pay | Admitting: Cardiology

## 2021-10-10 NOTE — Progress Notes (Signed)
? ?Primary Physician/Referring:  [name], MD ? ?Patient ID: Brittany Carroll, female    DOB: 08/19/1955, 66 y.o.   MRN: [id number] ? ?Chief Complaint  ?Patient presents with  ? Follow-up  ?  1 week  ? Atrial Fibrillation  ? ?HPI:   ? ?Brittany Carroll  is a 66 y.o. Female patient with history of atrial fibrillation S/P Cardioversion in 2008 without recurrence, hypertension, mixed hyperlipidemia, diabetes mellitus, tobacco use, hyperthyroidism previously treated with methimazole, fibromyalgia and chronic pain.  Patient was seen in 2021 for consultation regarding remote atrial fibrillation.  ? ?Patient was last seen in our office 09/18/2021.  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  Given these findings increase rosuvastatin to 20 mg daily and shared decision was to proceed with renal angiography.  Patient underwent renal angiography 10/03/2021 which revealed patent renal arteries with no significant abdominal aortic atherosclerosis.  Conclusion of the study was that Dopplers were falsely positive likely due to tortuosity in the patient's difficult to control blood pressure is related to poor diet and obesity.  Patient was therefore started on Catapres patch. ? ?She now presents for follow-up.  Patient has unfortunately not taken any of her antihypertensive medications today and has not started clonidine patch as prescribed following procedure.  She is to poor dietary and medication compliance. ? ?[name] blood pressure remains uncontrolled, however she is asymptomatic.  Denies chest pain, dyspnea, symptoms suggestive of TIA or CVA. ? ?Notably patient has been unable to tolerate amlodipine due to hives. ? ?Past Medical History:  ?Diagnosis Date  ? Cataract   ? OU  ? Diabetes mellitus without complication ([place])   ? Fibromyalgia   ? History of atrial fibrillation without current medication   ? 2008 in [place] had cardioversion  ? Hyperlipidemia   ? Hypertension    ? ?Past Surgical History:  ?Procedure Laterality Date  ? CERVICAL FUSION    ? ECTOPIC PREGNANCY SURGERY    ? LUMBAR FUSION    ? PARTIAL HYSTERECTOMY    ? RENAL ANGIOGRAPHY N/A 10/03/2021  ? Procedure: RENAL ANGIOGRAPHY;  Surgeon: Adrian Prows, MD;  Location: [facility];  Service: Cardiovascular;  Laterality: N/A;  ? ?Family History  ?Problem Relation Age of Onset  ? Atrial fibrillation Mother   ? Hypertension Mother   ? Hyperlipidemia Mother   ? Leukemia Father   ? Diabetes Brother   ? Hypertension Brother   ? Hyperlipidemia Brother   ? Diabetes Brother   ? Hyperlipidemia Brother   ? Hypertension Brother   ? ?Social History  ? ?Tobacco Use  ? Smoking status: Some Days  ?  Packs/day: 0.50  ?  Years: 35.00  ?  Pack years: 17.50  ?  Types: Cigarettes  ?  Last attempt to quit: 12/13/2020  ?  Years since quitting: 0.8  ? Smokeless tobacco: Never  ? Tobacco comments:  ?  Started 2 days ago again, and trying to wing herself off again.   ?Substance Use Topics  ? Alcohol use: Yes  ?  Comment: occasional  ? ?ROS  ?Review of Systems  ?Constitutional: Negative for malaise/fatigue and weight gain.  ?Eyes:  Negative for visual disturbance.  ?Cardiovascular:  Negative for chest pain, claudication, dyspnea on exertion, leg swelling, near-syncope, orthopnea, palpitations, paroxysmal nocturnal dyspnea and syncope.  ?Respiratory:  Negative for shortness of breath.   ?Hematologic/Lymphatic: Does not bruise/bleed easily.  ?Neurological:  Negative for dizziness.  ? ?Objective  ?  Blood pressure (!) 181/102, pulse 95, temperature 98.7 ?F (37.1 ?C), temperature source Temporal, resp. rate 17, height 5' 7"  (1.702 m), weight 225 lb 6.4 oz (102.2 kg), SpO2 98 %.  ? ?  10/11/2021  ?  1:55 PM 10/03/2021  ?  4:32 PM 10/03/2021  ?  4:02 PM  ?Vitals with BMI  ?Height 5' 7"     ?Weight 225 lbs 6 oz    ?BMI 35.29    ?Systolic 544 920 100  ?Diastolic 712 84 79  ?Pulse 95 73 75  ?  ? Physical Exam ?Vitals reviewed.  ?Constitutional:   ?   Appearance: She  is well-developed. She is obese.  ?Neck:  ?   Thyroid: No thyromegaly.  ?Cardiovascular:  ?   Rate and Rhythm: Normal rate and regular rhythm.  ?   Pulses: Intact distal pulses.     ?     Carotid pulses are 2+ on the right side and 2+ on the left side. ?     Radial pulses are 2+ on the right side and 2+ on the left side.  ?     Dorsalis pedis pulses are 2+ on the right side and 2+ on the left side.  ?   Heart sounds: Normal heart sounds, S1 normal and S2 normal. No murmur heard. ?  No gallop.  ?   Comments: No leg edema, no JVD. ?Pulmonary:  ?   Effort: Pulmonary effort is normal. No respiratory distress.  ?   Breath sounds: Normal breath sounds. No wheezing, rhonchi or rales.  ?Musculoskeletal:  ?   Right lower leg: No edema.  ?   Left lower leg: No edema.  ?Skin: ?   General: Skin is warm and dry.  ?Neurological:  ?   General: No focal deficit present.  ?   Mental Status: She is oriented to person, place, and time.  ?   Cranial Nerves: No cranial nerve deficit.  ?Physical exam unchanged compared to previous office visit. ? ?Laboratory examination:  ? ? ?  Latest Ref Rng & Units 09/27/2021  ?  2:43 PM 08/14/2021  ? 12:10 PM 01/25/2021  ?  1:02 PM  ?CMP  ?Glucose 70 - 99 mg/dL 184   231   160    ?BUN 8 - 27 mg/dL 11   12   12     ?Creatinine 0.57 - 1.00 mg/dL 0.78   0.95   0.88    ?Sodium 134 - 144 mmol/L 140   138   139    ?Potassium 3.5 - 5.2 mmol/L 4.0   4.6   4.7    ?Chloride 96 - 106 mmol/L 101   99   102    ?CO2 20 - 29 mmol/L 23   22   23     ?Calcium 8.7 - 10.3 mg/dL 9.4   8.8   9.2    ? ? ?  Latest Ref Rng & Units 09/27/2021  ?  2:43 PM  ?CBC  ?WBC 3.4 - 10.8 x10E3/uL 7.8    ?Hemoglobin 11.1 - 15.9 g/dL 11.4    ?Hematocrit 34.0 - 46.6 % 34.6    ?Platelets 150 - 450 x10E3/uL 238    ? ?Lipid Panel  ?   ?Component Value Date/Time  ? CHOL 152 08/14/2021 1210  ? TRIG 142 08/14/2021 1210  ? HDL 49 08/14/2021 1210  ? [place] 78 08/14/2021 1210  ? ?HEMOGLOBIN A1C ?No results found for: HGBA1C, MPG ?TSH ?No results for  input(s): TSH  in the last 8760 hours. ? ?External labs: ?11/09/2020: ?Hemoglobin 8.8, hematocrit 25.8, MCV 76.5, platelet 197 ?Magnesium 2.0 ?A1c 7.0% ?Sodium 138, potassium 4.1, BUN 19, creatinine 0.68, GFR >90 ? ?05/08/2019: Serum glucose 157 mg, BUN 11, creatinine 0.8, eGFR >72 mL.  Potassium 4.8.  CMP normal.  CBC normal with minimal microcytic index. ? ?Total cholesterol 188, triglycerides 172, HDL 57, LDL 97.  Non-HDL cholesterol 131. ? ?TSH normal.  Vitamin D mildly reduced at 28.5.  A1c 6.1%.  ?  ?Allergies  ? ?Allergies  ?Allergen Reactions  ? Lisinopril Hives  ? Metformin And Related Hives  ?  ?Medications Prior to Visit:  ? ?Outpatient Medications Prior to Visit  ?Medication Sig Dispense Refill  ? amitriptyline (ELAVIL) 75 MG tablet Take 75 mg by mouth at bedtime.    ? Ascorbic Acid (VITAMIN C) 1000 MG tablet Take 1,000 mg by mouth daily.    ? aspirin 325 MG tablet Take 325 mg by mouth daily.    ? buPROPion (WELLBUTRIN XL) 150 MG 24 hr tablet Take 150 mg by mouth daily.    ? cholecalciferol (VITAMIN D3) 25 MCG (1000 UT) tablet Take 1,000 Units by mouth daily.    ? cloNIDine (CATAPRES - DOSED IN MG/24 HR) 0.2 mg/24hr patch Place 1 patch (0.2 mg total) onto the skin every 7 (seven) days. 4 patch 3  ? cloNIDine (CATAPRES - DOSED IN MG/24 HR) 0.2 mg/24hr patch 0.2 mg once a week.    ? esomeprazole (NEXIUM) 40 MG capsule Take 40 mg by mouth daily as needed (acid reflux).    ? HUMALOG KWIKPEN 200 UNIT/ML KwikPen Inject 5 Units into the skin 3 (three) times daily with meals.    ? hydrALAZINE (APRESOLINE) 50 MG tablet Take 1 tablet (50 mg total) by mouth 3 (three) times daily. 270 tablet 3  ? hydrochlorothiazide (HYDRODIURIL) 25 MG tablet Take 1 tablet (25 mg total) by mouth daily. 90 tablet 3  ? insulin glargine ([place] SOLOSTAR) 100 UNIT/ML Solostar Pen Inject 12 Units into the skin at bedtime.    ? JENTADUETO 2.09-998 MG TABS Take 1 tablet by mouth 2 (two) times daily.    ? losartan (COZAAR) 100 MG tablet Take  1 tablet (100 mg total) by mouth daily. 90 tablet 0  ? magnesium oxide (MAG-OX) 400 MG tablet Take 400 mg by mouth 2 (two) times daily.    ? methimazole (TAPAZOLE) 10 MG tablet Take 10 mg by mouth daily.

## 2021-10-11 ENCOUNTER — Ambulatory Visit: Payer: Medicare Other | Admitting: Student

## 2021-10-11 ENCOUNTER — Encounter: Payer: Self-pay | Admitting: Student

## 2021-10-11 VITALS — BP 161/85 | HR 95 | Temp 98.7°F | Resp 17 | Ht 67.0 in | Wt 225.4 lb

## 2021-10-11 DIAGNOSIS — I10 Essential (primary) hypertension: Secondary | ICD-10-CM

## 2021-10-18 NOTE — Progress Notes (Shared)
[facility] - Clinic Note  10/31/2021     CHIEF COMPLAINT Patient presents for No chief complaint on file.   HISTORY OF PRESENT ILLNESS: Brittany Carroll is a 66 y.o. female who presents to the clinic today for:    pt states vision is stable, she states she does she floaters and an occasional fol OD, she states her last A1c was 6 something, she is still on oral medication and 2 insulins, she is using Restasis for dry eyes  Referring physician: Ellyn Hack, MD [address] [place],  [place] [address]  HISTORICAL INFORMATION:   Selected notes from the MEDICAL RECORD NUMBER Referred by PCP for DM exam   CURRENT MEDICATIONS: No current outpatient medications on file. (Ophthalmic Drugs)   No current facility-administered medications for this visit. (Ophthalmic Drugs)   Current Outpatient Medications (Other)  Medication Sig   amitriptyline (ELAVIL) 75 MG tablet Take 75 mg by mouth at bedtime.   Ascorbic Acid (VITAMIN C) 1000 MG tablet Take 1,000 mg by mouth daily.   aspirin 325 MG tablet Take 325 mg by mouth daily.   buPROPion (WELLBUTRIN XL) 150 MG 24 hr tablet Take 150 mg by mouth daily.   cholecalciferol (VITAMIN D3) 25 MCG (1000 UT) tablet Take 1,000 Units by mouth daily.   cloNIDine (CATAPRES - DOSED IN MG/24 HR) 0.2 mg/24hr patch Place 1 patch (0.2 mg total) onto the skin every 7 (seven) days.   cloNIDine (CATAPRES - DOSED IN MG/24 HR) 0.2 mg/24hr patch 0.2 mg once a week.   esomeprazole (NEXIUM) 40 MG capsule Take 40 mg by mouth daily as needed (acid reflux).   HUMALOG KWIKPEN 200 UNIT/ML KwikPen Inject 5 Units into the skin 3 (three) times daily with meals.   hydrALAZINE (APRESOLINE) 50 MG tablet Take 1 tablet (50 mg total) by mouth 3 (three) times daily.   hydrochlorothiazide (HYDRODIURIL) 25 MG tablet Take 1 tablet (25 mg total) by mouth daily.   insulin glargine (LANTUS SOLOSTAR) 100 UNIT/ML Solostar Pen Inject 12 Units into the skin at bedtime.    JENTADUETO 2.09-998 MG TABS Take 1 tablet by mouth 2 (two) times daily.   losartan (COZAAR) 100 MG tablet Take 1 tablet (100 mg total) by mouth daily.   magnesium oxide (MAG-OX) 400 MG tablet Take 400 mg by mouth 2 (two) times daily.   methimazole (TAPAZOLE) 10 MG tablet Take 10 mg by mouth daily.   methocarbamol (ROBAXIN) 500 MG tablet Take 500 mg by mouth 2 (two) times daily as needed for muscle spasms.   metoprolol succinate (TOPROL-XL) 50 MG 24 hr tablet TAKE 1 TABLET (50 MG TOTAL) BY MOUTH EVERY EVENING. TAKE WITH OR IMMEDIATELY FOLLOWING A MEAL.   morphine (MS CONTIN) 15 MG 12 hr tablet Take 15 mg by mouth 2 (two) times daily.   Multiple Vitamin (MULTIVITAMIN WITH MINERALS) TABS tablet Take 1 tablet by mouth daily.   Oxycodone HCl 20 MG TABS Take 20 mg by mouth 4 (four) times daily.   pregabalin (LYRICA) 200 MG capsule Take 200 mg by mouth 2 (two) times daily.   rosuvastatin (CRESTOR) 10 MG tablet Take 1 tablet by mouth at bedtime.   No current facility-administered medications for this visit. (Other)      REVIEW OF SYSTEMS:     ALLERGIES Allergies  Allergen Reactions   Lisinopril Hives   Metformin And Related Hives    PAST MEDICAL HISTORY Past Medical History:  Diagnosis Date   Cataract  OU   Diabetes mellitus without complication (HCC)    Fibromyalgia    History of atrial fibrillation without current medication    2008 in [place] cardioversion   Hyperlipidemia    Hypertension    Past Surgical History:  Procedure Laterality Date   CERVICAL FUSION     ECTOPIC PREGNANCY SURGERY     LUMBAR FUSION     PARTIAL HYSTERECTOMY     RENAL ANGIOGRAPHY N/A 10/03/2021   Procedure: RENAL ANGIOGRAPHY;  Surgeon: Yates Decamp, MD;  Location: [facility];  Service: Cardiovascular;  Laterality: N/A;    FAMILY HISTORY Family History  Problem Relation Age of Onset   Atrial fibrillation Mother    Hypertension Mother    Hyperlipidemia Mother    Leukemia Father    Diabetes  Brother    Hypertension Brother    Hyperlipidemia Brother    Diabetes Brother    Hyperlipidemia Brother    Hypertension Brother     SOCIAL HISTORY Social History   Tobacco Use   Smoking status: Some Days    Packs/day: 0.50    Years: 35.00    Pack years: 17.50    Types: Cigarettes    Last attempt to quit: 12/13/2020    Years since quitting: 0.8   Smokeless tobacco: Never   Tobacco comments:    Started 2 days ago again, and trying to wing herself off again.   Vaping Use   Vaping Use: Never used  Substance Use Topics   Alcohol use: Yes    Comment: occasional   Drug use: Not Currently    Types: Heroin, Cocaine, Marijuana    Comment: still uses medical marijuana occ         OPHTHALMIC EXAM:  Not recorded     IMAGING AND PROCEDURES  Imaging and Procedures for @TODAY @            ASSESSMENT/PLAN:    ICD-10-CM   1. Diabetes mellitus type 2 without retinopathy (HCC)  E11.9     2. Essential hypertension  I10     3. Hypertensive retinopathy of both eyes  H35.033     4. Combined forms of age-related cataract of both eyes  H25.813        1,2. Diabetes mellitus, type 2 without retinopathy  - A1c "6-something" per pt report  - The incidence, risk factors for progression, natural history and treatment options for diabetic retinopathy  were discussed with patient.    - The need for close monitoring of blood glucose, blood pressure, and serum lipids, avoiding cigarette or any type of tobacco, and the need for long term follow up was also discussed with patient.  - f/u in 1 year, sooner prn  3,4. Hypertensive retinopathy OU  - discussed importance of tight BP control  - monitor  5. 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  - discussed diagnosis and progression  - not yet visually significant  - monitor for now  - pt has not yet established primary eye care with  Optometry or Ophthalmology -- offered to help w/ referral as needed -- plans to talk to insurance carrier to find provider   Ophthalmic Meds Ordered this visit:  No orders of the defined types were placed in this encounter.      No follow-ups on file.  There are no Patient Instructions on file for this visit.   Explained the diagnoses, plan,  and follow up with the patient and they expressed understanding.  Patient expressed understanding of the importance of proper follow up care.   This document serves as a record of services personally performed by Karie ChimeraBrian G. Zamora, MD, PhD. It was created on their behalf by Julieanne Cottonhristine N Canary Fister, COT, an ophthalmic technician. The creation of this record is the provider's dictation and/or activities during the visit.    Electronically signed by: Julieanne Cottonhristine N Avin Gibbons, COT 10/18/21 10:54 AM    Karie ChimeraBrian G. Zamora, M.D., Ph.D. Diseases & Surgery of the Retina and Vitreous [facility] 07/21/20      Abbreviations: M myopia (nearsighted); A astigmatism; H hyperopia (farsighted); P presbyopia; Mrx spectacle prescription;  CTL contact lenses; OD right eye; OS left eye; OU both eyes  XT exotropia; ET esotropia; PEK punctate epithelial keratitis; PEE punctate epithelial erosions; DES dry eye syndrome; MGD meibomian gland dysfunction; ATs artificial tears; PFAT's preservative free artificial tears; NSC nuclear sclerotic cataract; PSC posterior subcapsular cataract; ERM epi-retinal membrane; PVD posterior vitreous detachment; RD retinal detachment; DM diabetes mellitus; DR diabetic retinopathy; NPDR non-proliferative diabetic retinopathy; PDR proliferative diabetic retinopathy; CSME clinically significant macular edema; DME diabetic macular edema; dbh dot blot hemorrhages; CWS cotton wool spot; POAG primary open angle glaucoma; C/D cup-to-disc ratio; HVF humphrey visual field; GVF goldmann visual field; OCT optical coherence tomography; IOP  intraocular pressure; BRVO Branch retinal vein occlusion; CRVO central retinal vein occlusion; CRAO central retinal artery occlusion; BRAO branch retinal artery occlusion; RT retinal tear; SB scleral buckle; PPV pars plana vitrectomy; VH Vitreous hemorrhage; PRP panretinal laser photocoagulation; IVK intravitreal kenalog; VMT vitreomacular traction; MH Macular hole;  NVD neovascularization of the disc; NVE neovascularization elsewhere; AREDS age related eye disease study; ARMD age related macular degeneration; POAG primary open angle glaucoma; EBMD epithelial/anterior basement membrane dystrophy; ACIOL anterior chamber intraocular lens; IOL intraocular lens; PCIOL posterior chamber intraocular lens; Phaco/IOL phacoemulsification with intraocular lens placement; PRK photorefractive keratectomy; LASIK laser assisted in situ keratomileusis; HTN hypertension; DM diabetes mellitus; COPD chronic obstructive pulmonary disease

## 2021-10-31 ENCOUNTER — Encounter (INDEPENDENT_AMBULATORY_CARE_PROVIDER_SITE_OTHER): Payer: Medicare Other | Admitting: Ophthalmology

## 2021-10-31 DIAGNOSIS — H25813 Combined forms of age-related cataract, bilateral: Secondary | ICD-10-CM

## 2021-10-31 DIAGNOSIS — I10 Essential (primary) hypertension: Secondary | ICD-10-CM

## 2021-10-31 DIAGNOSIS — E119 Type 2 diabetes mellitus without complications: Secondary | ICD-10-CM

## 2021-10-31 DIAGNOSIS — H35033 Hypertensive retinopathy, bilateral: Secondary | ICD-10-CM

## 2021-11-06 ENCOUNTER — Encounter (INDEPENDENT_AMBULATORY_CARE_PROVIDER_SITE_OTHER): Payer: Self-pay | Admitting: Ophthalmology

## 2021-11-06 ENCOUNTER — Ambulatory Visit (INDEPENDENT_AMBULATORY_CARE_PROVIDER_SITE_OTHER): Payer: Medicare Other | Admitting: Ophthalmology

## 2021-11-06 DIAGNOSIS — I10 Essential (primary) hypertension: Secondary | ICD-10-CM | POA: Diagnosis not present

## 2021-11-06 DIAGNOSIS — H25813 Combined forms of age-related cataract, bilateral: Secondary | ICD-10-CM | POA: Diagnosis not present

## 2021-11-06 DIAGNOSIS — E119 Type 2 diabetes mellitus without complications: Secondary | ICD-10-CM

## 2021-11-06 DIAGNOSIS — H35033 Hypertensive retinopathy, bilateral: Secondary | ICD-10-CM

## 2021-11-06 NOTE — Progress Notes (Signed)
[facility] - Clinic Note  11/06/2021     CHIEF COMPLAINT Patient presents for Diabetic Eye Exam   HISTORY OF PRESENT ILLNESS: Brittany Carroll is a 66 y.o. female who presents to the clinic today for:   HPI     Diabetic Eye Exam   Vision is stable.  Associated Symptoms Flashes and Floaters.  Diabetes characteristics include Type 2, controlled with diet, on insulin and taking oral medications.  This started 16 years ago.  Blood sugar level is controlled.  Last Blood Glucose: 120.  Last A1C 6.7.  I, the attending physician,  performed the HPI with the patient and updated documentation appropriately.        Comments   Patient states that her vision is still the same, no changes. She is seeing flashes and floaters in both eyes. When reading the words on the page jumble together and move.       Last edited by Rennis Chris, MD on 11/07/2021 11:55 PM.    Difficulties reading. Patient stopped driving at night due to vision.   Referring physician: Ellyn Hack, MD [address] [place],  [place] [address]  HISTORICAL INFORMATION:   Selected notes from the MEDICAL RECORD NUMBER Referred by PCP for DM exam   CURRENT MEDICATIONS: No current outpatient medications on file. (Ophthalmic Drugs)   No current facility-administered medications for this visit. (Ophthalmic Drugs)   Current Outpatient Medications (Other)  Medication Sig   amitriptyline (ELAVIL) 75 MG tablet Take 75 mg by mouth at bedtime.   Ascorbic Acid (VITAMIN C) 1000 MG tablet Take 1,000 mg by mouth daily.   aspirin 325 MG tablet Take 325 mg by mouth daily.   buPROPion (WELLBUTRIN XL) 150 MG 24 hr tablet Take 150 mg by mouth daily.   cholecalciferol (VITAMIN D3) 25 MCG (1000 UT) tablet Take 1,000 Units by mouth daily.   cloNIDine (CATAPRES - DOSED IN MG/24 HR) 0.2 mg/24hr patch Place 1 patch (0.2 mg total) onto the skin every 7 (seven) days.   cloNIDine (CATAPRES - DOSED IN MG/24 HR) 0.2 mg/24hr  patch 0.2 mg once a week.   esomeprazole (NEXIUM) 40 MG capsule Take 40 mg by mouth daily as needed (acid reflux).   HUMALOG KWIKPEN 200 UNIT/ML KwikPen Inject 5 Units into the skin 3 (three) times daily with meals.   hydrALAZINE (APRESOLINE) 50 MG tablet Take 1 tablet (50 mg total) by mouth 3 (three) times daily.   hydrochlorothiazide (HYDRODIURIL) 25 MG tablet Take 1 tablet (25 mg total) by mouth daily.   insulin glargine (LANTUS SOLOSTAR) 100 UNIT/ML Solostar Pen Inject 12 Units into the skin at bedtime.   JENTADUETO 2.09-998 MG TABS Take 1 tablet by mouth 2 (two) times daily.   losartan (COZAAR) 100 MG tablet Take 1 tablet (100 mg total) by mouth daily.   magnesium oxide (MAG-OX) 400 MG tablet Take 400 mg by mouth 2 (two) times daily.   methimazole (TAPAZOLE) 10 MG tablet Take 10 mg by mouth daily.   methocarbamol (ROBAXIN) 500 MG tablet Take 500 mg by mouth 2 (two) times daily as needed for muscle spasms.   morphine (MS CONTIN) 15 MG 12 hr tablet Take 15 mg by mouth 2 (two) times daily.   Multiple Vitamin (MULTIVITAMIN WITH MINERALS) TABS tablet Take 1 tablet by mouth daily.   Oxycodone HCl 20 MG TABS Take 20 mg by mouth 4 (four) times daily.   pregabalin (LYRICA) 200 MG capsule Take 200 mg by  mouth 2 (two) times daily.   rosuvastatin (CRESTOR) 10 MG tablet Take 1 tablet by mouth at bedtime.   metoprolol succinate (TOPROL-XL) 50 MG 24 hr tablet TAKE 1 TABLET (50 MG TOTAL) BY MOUTH EVERY EVENING. TAKE WITH OR IMMEDIATELY FOLLOWING A MEAL.   No current facility-administered medications for this visit. (Other)   REVIEW OF SYSTEMS: ROS   Positive for: Eyes Negative for: Constitutional, Gastrointestinal, Neurological, Skin, Genitourinary, Musculoskeletal, HENT, Endocrine, Cardiovascular, Respiratory, Psychiatric, Allergic/Imm, Heme/Lymph Last edited by Joni Reining, COA on 11/06/2021  3:24 PM.     ALLERGIES Allergies  Allergen Reactions   Lisinopril Hives   Metformin And Related Hives    PAST MEDICAL HISTORY Past Medical History:  Diagnosis Date   Cataract    OU   Diabetes mellitus without complication (HCC)    Fibromyalgia    History of atrial fibrillation without current medication    2008 in [place] had cardioversion   Hyperlipidemia    Hypertension    Macular degeneration    Past Surgical History:  Procedure Laterality Date   CERVICAL FUSION     ECTOPIC PREGNANCY SURGERY     LUMBAR FUSION     PARTIAL HYSTERECTOMY     RENAL ANGIOGRAPHY N/A 10/03/2021   Procedure: RENAL ANGIOGRAPHY;  Surgeon: Yates Decamp, MD;  Location: [facility];  Service: Cardiovascular;  Laterality: N/A;   FAMILY HISTORY Family History  Problem Relation Age of Onset   Atrial fibrillation Mother    Hypertension Mother    Hyperlipidemia Mother    Leukemia Father    Diabetes Brother    Hypertension Brother    Hyperlipidemia Brother    Diabetes Brother    Hyperlipidemia Brother    Hypertension Brother    SOCIAL HISTORY Social History   Tobacco Use   Smoking status: Some Days    Packs/day: 0.50    Years: 35.00    Total pack years: 17.50    Types: Cigarettes    Last attempt to quit: 12/13/2020    Years since quitting: 0.9   Smokeless tobacco: Never   Tobacco comments:    Started 2 days ago again, and trying to wing herself off again.   Vaping Use   Vaping Use: Never used  Substance Use Topics   Alcohol use: Not Currently    Comment: occasional   Drug use: Yes    Types: Heroin, Cocaine, Marijuana    Comment: still uses medical marijuana occ       OPHTHALMIC EXAM:  Base Eye Exam     Visual Acuity (Snellen - Linear)       Right Left   Dist [place] 20/40 20/30 +2   Dist ph [place] NI NI         Tonometry (Tonopen, 2:30 PM)       Right Left   Pressure 13 13         Pupils       Dark Light Shape React APD   Right 3 2 Round Brisk None   Left 3 2 Round Brisk None         Visual Fields       Left Right    Full Full         Extraocular Movement        Right Left    Full, Ortho Full, Ortho         Neuro/Psych     Oriented x3: Yes   Mood/Affect: Normal  Dilation     Both eyes: 1.0% Mydriacyl, 2.5% Phenylephrine @ 2:24 PM           Slit Lamp and Fundus Exam     Slit Lamp Exam       Right Left   Lids/Lashes Dermatochalasis - upper lid Dermatochalasis - upper lid   Conjunctiva/Sclera Mild Melanosis, inferior Conjunctivochalasis Mild Melanosis, inferior Conjunctivochalasis   Cornea Arcus, trace Punctate epithelial erosions, trace EBMD Arcus, trace Punctate epithelial erosions, trace tear film debris   Anterior Chamber Deep and clear Deep and quiet   Iris Round and dilated, No NVI Round and dilated, No NVI   Lens 2-3+ Nuclear sclerosis with brunescence, 3+ Cortical cataract 2-3+ Nuclear sclerosis with brunescence, 3+ Cortical cataract   Anterior Vitreous Mild Vitreous syneresis Mild Vitreous syneresis         Fundus Exam       Right Left   Disc Pink and Sharp, Compact Pink and Sharp, mild temporal PPA   C/D Ratio 0.3 0.3   Macula Flat, Good foveal reflex, mild Retinal pigment epithelial mottling, No heme or edema Flat, Good foveal reflex, mild Retinal pigment epithelial mottling and clumping, No heme or edema   Vessels mild attenuation, mild tortuousity attenuated, mild tortuousity   Periphery Attached, No heme  Attached, No heme             IMAGING AND PROCEDURES  Imaging and Procedures for @TODAY @  OCT, Retina - OU - Both Eyes       Right Eye Quality was good. Central Foveal Thickness: 237. Progression has been stable. Findings include normal foveal contour, no IRF, no SRF, myopic contour, vitreomacular adhesion .   Left Eye Quality was good. Central Foveal Thickness: 236. Progression has been stable. Findings include normal foveal contour, no IRF, no SRF, myopic contour, vitreomacular adhesion .   Notes *Images captured and stored on drive  Diagnosis / Impression:  NFP, no IRF/SRF OU No DME  OU  Clinical management:  See below  Abbreviations: NFP - Normal foveal profile. CME - cystoid macular edema. PED - pigment epithelial detachment. IRF - intraretinal fluid. SRF - subretinal fluid. EZ - ellipsoid zone. ERM - epiretinal membrane. ORA - outer retinal atrophy. ORT - outer retinal tubulation. SRHM - subretinal hyper-reflective material            ASSESSMENT/PLAN:    ICD-10-CM   1. Diabetes mellitus type 2 without retinopathy (HCC)  E11.9 OCT, Retina - OU - Both Eyes    2. Essential hypertension  I10     3. Hypertensive retinopathy of both eyes  H35.033     4. Combined forms of age-related cataract of both eyes  H25.813      1. Diabetes mellitus, type 2 without retinopathy  - A1C patient believes 6 something, 7.0 on 06.15.22  - The incidence, risk factors for progression, natural history and treatment options for diabetic retinopathy  were discussed with patient.    - The need for close monitoring of blood glucose, blood pressure, and serum lipids, avoiding cigarette or any type of tobacco, and the need for long term follow up was also discussed with patient.  - f/u in 1 year, sooner prn  2,3. Hypertensive retinopathy OU  - discussed importance of tight BP control  - monitor  4. 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  - discussed diagnosis and progression  - approaching visual  significance  - will refer to Dr. Dione Booze for cataract eval  Ophthalmic Meds Ordered this visit:  No orders of the defined types were placed in this encounter.    Return in about 1 year (around 11/07/2022) for DFE, OCT.  There are no Patient Instructions on file for this visit.   Explained the diagnoses, plan, and follow up with the patient and they expressed understanding.  Patient expressed understanding of the importance of proper follow up care.   This document serves as a record of  services personally performed by Karie Chimera, MD, PhD. It was created on their behalf by Annalee Genta, COMT. The creation of this record is the provider's dictation and/or activities during the visit.  Electronically signed by: Annalee Genta, COMT 11/07/21 11:56 PM  This document serves as a record of services personally performed by Karie Chimera, MD, PhD. It was created on their behalf by Joni Reining, an ophthalmic technician. The creation of this record is the provider's dictation and/or activities during the visit.    Electronically signed by: Joni Reining COA, 11/07/21  11:56 PM  Karie Chimera, M.D., Ph.D. Diseases & Surgery of the Retina and Vitreous [facility]  I have reviewed the above documentation for accuracy and completeness, and I agree with the above. Karie Chimera, M.D., Ph.D. 11/07/21 11:58 PM  Abbreviations: M myopia (nearsighted); A astigmatism; H hyperopia (farsighted); P presbyopia; Mrx spectacle prescription;  CTL contact lenses; OD right eye; OS left eye; OU both eyes  XT exotropia; ET esotropia; PEK punctate epithelial keratitis; PEE punctate epithelial erosions; DES dry eye syndrome; MGD meibomian gland dysfunction; ATs artificial tears; PFAT's preservative free artificial tears; NSC nuclear sclerotic cataract; PSC posterior subcapsular cataract; ERM epi-retinal membrane; PVD posterior vitreous detachment; RD retinal detachment; DM diabetes mellitus; DR diabetic retinopathy; NPDR non-proliferative diabetic retinopathy; PDR proliferative diabetic retinopathy; CSME clinically significant macular edema; DME diabetic macular edema; dbh dot blot hemorrhages; CWS cotton wool spot; POAG primary open angle glaucoma; C/D cup-to-disc ratio; HVF humphrey visual field; GVF goldmann visual field; OCT optical coherence tomography; IOP intraocular pressure; BRVO Branch retinal vein occlusion; CRVO central retinal vein occlusion; CRAO central retinal artery  occlusion; BRAO branch retinal artery occlusion; RT retinal tear; SB scleral buckle; PPV pars plana vitrectomy; VH Vitreous hemorrhage; PRP panretinal laser photocoagulation; IVK intravitreal kenalog; VMT vitreomacular traction; MH Macular hole;  NVD neovascularization of the disc; NVE neovascularization elsewhere; AREDS age related eye disease study; ARMD age related macular degeneration; POAG primary open angle glaucoma; EBMD epithelial/anterior basement membrane dystrophy; ACIOL anterior chamber intraocular lens; IOL intraocular lens; PCIOL posterior chamber intraocular lens; Phaco/IOL phacoemulsification with intraocular lens placement; PRK photorefractive keratectomy; LASIK laser assisted in situ keratomileusis; HTN hypertension; DM diabetes mellitus; COPD chronic obstructive pulmonary disease

## 2021-11-07 ENCOUNTER — Encounter (INDEPENDENT_AMBULATORY_CARE_PROVIDER_SITE_OTHER): Payer: Self-pay | Admitting: Ophthalmology

## 2021-11-30 ENCOUNTER — Telehealth: Payer: Self-pay

## 2021-11-30 NOTE — Telephone Encounter (Signed)
Yes that is fine

## 2021-12-11 ENCOUNTER — Ambulatory Visit: Payer: Medicare Other | Admitting: Student

## 2021-12-11 ENCOUNTER — Encounter: Payer: Self-pay | Admitting: Student

## 2021-12-11 VITALS — BP 154/83 | HR 99 | Temp 98.3°F | Resp 16 | Ht 67.0 in | Wt 220.4 lb

## 2021-12-11 DIAGNOSIS — I10 Essential (primary) hypertension: Secondary | ICD-10-CM

## 2021-12-11 DIAGNOSIS — I1A0 Resistant hypertension: Secondary | ICD-10-CM

## 2021-12-11 NOTE — Progress Notes (Signed)
Primary Physician/Referring:  [name], MD  Patient ID: Brittany Carroll, female    DOB: 08-Mar-1956, 66 y.o.   MRN: [id number]  No chief complaint on file.  HPI:    Brittany Carroll  is a 66 y.o. Female patient with history of atrial fibrillation S/P Cardioversion in 2008 without recurrence, hypertension, mixed hyperlipidemia, diabetes mellitus, tobacco use, hyperthyroidism previously treated with methimazole, fibromyalgia and chronic pain.   Previously renal artery duplex revealed right renal artery stenosis and atherosclerosis of the abdominal aorta. Patient underwent renal angiography 10/03/2021 which revealed patent renal arteries with no significant abdominal aortic atherosclerosis.  Conclusion of the study was that Dopplers were falsely positive likely due to tortuosity in the patient's difficult to control blood pressure is related to poor diet and obesity.  Patient was therefore started on Catapres patch.  Patient was last seen in the office 10/11/2021 at which time discussed at length regarding diet and lifestyle modifications including DASH diet and weight loss to improve hypertension control.  She at that time was also not using clonidine patches which were previously prescribed.  Patient is enrolled in remote patient monitoring.  She presents for 8-week follow-up of hypertension. I reviewed remote monitoring data, average home blood pressure reading 136/87 mmHg.  Patient reports improved dietary compliance and is not using clonidine patches regularly.  Pressure is uncontrolled in the office today, however on home monitoring it is well controlled.  Notably patient has been unable to tolerate amlodipine due to hives.  Past Medical History:  Diagnosis Date   Cataract    OU   Diabetes mellitus without complication ([place])    Fibromyalgia    History of atrial fibrillation without current medication    2008 in [place] had cardioversion   Hyperlipidemia    Hypertension    Macular  degeneration    Past Surgical History:  Procedure Laterality Date   CERVICAL FUSION     ECTOPIC PREGNANCY SURGERY     LUMBAR FUSION     PARTIAL HYSTERECTOMY     RENAL ANGIOGRAPHY N/A 10/03/2021   Procedure: RENAL ANGIOGRAPHY;  Surgeon: Adrian Prows, MD;  Location: [facility];  Service: Cardiovascular;  Laterality: N/A;   Family History  Problem Relation Age of Onset   Atrial fibrillation Mother    Hypertension Mother    Hyperlipidemia Mother    Leukemia Father    Diabetes Brother    Hypertension Brother    Hyperlipidemia Brother    Diabetes Brother    Hyperlipidemia Brother    Hypertension Brother    Social History   Tobacco Use   Smoking status: Some Days    Packs/day: 0.50    Years: 35.00    Total pack years: 17.50    Types: Cigarettes    Last attempt to quit: 12/13/2020    Years since quitting: 0.9   Smokeless tobacco: Never   Tobacco comments:    Started 2 days ago again, and trying to wing herself off again.   Substance Use Topics   Alcohol use: Not Currently    Comment: occasional   ROS  Review of Systems  Constitutional: Negative for malaise/fatigue and weight gain.  Eyes:  Negative for visual disturbance.  Cardiovascular:  Negative for chest pain, claudication, dyspnea on exertion, leg swelling, near-syncope, orthopnea, palpitations, paroxysmal nocturnal dyspnea and syncope.  Respiratory:  Negative for shortness of breath.   Hematologic/Lymphatic: Does not bruise/bleed easily.  Neurological:  Negative for dizziness.    Objective  There were  no vitals taken for this visit.     10/11/2021    1:55 PM 10/03/2021    4:32 PM 10/03/2021    4:02 PM  Vitals with BMI  Height 5' 7"     Weight 225 lbs 6 oz    BMI 18.55    Systolic 015 868 257  Diastolic 85 84 79  Pulse 95 73 75     Physical Exam Vitals reviewed.  Constitutional:      Appearance: She is well-developed. She is obese.  Neck:     Thyroid: No thyromegaly.  Cardiovascular:     Rate and  Rhythm: Normal rate and regular rhythm.     Pulses: Intact distal pulses.          Carotid pulses are 2+ on the right side and 2+ on the left side.      Radial pulses are 2+ on the right side and 2+ on the left side.       Dorsalis pedis pulses are 2+ on the right side and 2+ on the left side.     Heart sounds: Normal heart sounds, S1 normal and S2 normal. No murmur heard.    No gallop.     Comments: No JVD. Pulmonary:     Effort: Pulmonary effort is normal. No respiratory distress.     Breath sounds: Normal breath sounds. No wheezing, rhonchi or rales.  Musculoskeletal:     Right lower leg: No edema.     Left lower leg: No edema.     Laboratory examination:      Latest Ref Rng & Units 09/27/2021    2:43 PM 08/14/2021   12:10 PM 01/25/2021    1:02 PM  CMP  Glucose 70 - 99 mg/dL 184  231  160   BUN 8 - 27 mg/dL 11  12  12    Creatinine 0.57 - 1.00 mg/dL 0.78  0.95  0.88   Sodium 134 - 144 mmol/L 140  138  139   Potassium 3.5 - 5.2 mmol/L 4.0  4.6  4.7   Chloride 96 - 106 mmol/L 101  99  102   CO2 20 - 29 mmol/L 23  22  23    Calcium 8.7 - 10.3 mg/dL 9.4  8.8  9.2       Latest Ref Rng & Units 09/27/2021    2:43 PM  CBC  WBC 3.4 - 10.8 x10E3/uL 7.8   Hemoglobin 11.1 - 15.9 g/dL 11.4   Hematocrit 34.0 - 46.6 % 34.6   Platelets 150 - 450 x10E3/uL 238    Lipid Panel     Component Value Date/Time   CHOL 152 08/14/2021 1210   TRIG 142 08/14/2021 1210   HDL 49 08/14/2021 1210   LDLCALC 78 08/14/2021 1210   HEMOGLOBIN A1C No results found for: "HGBA1C", "MPG" TSH No results for input(s): "TSH" in the last 8760 hours.  External labs: 11/09/2020: Hemoglobin 8.8, hematocrit 25.8, MCV 76.5, platelet 197 Magnesium 2.0 A1c 7.0% Sodium 138, potassium 4.1, BUN 19, creatinine 0.68, GFR >90  05/08/2019: Serum glucose 157 mg, BUN 11, creatinine 0.8, eGFR >72 mL.  Potassium 4.8.  CMP normal.  CBC normal with minimal microcytic index.  Total cholesterol 188, triglycerides 172, HDL  57, LDL 97.  Non-HDL cholesterol 131.  TSH normal.  Vitamin D mildly reduced at 28.5.  A1c 6.1%.    Allergies   Allergies  Allergen Reactions   Lisinopril Hives   Metformin And Related Hives    Medications  Prior to Visit:   Outpatient Medications Prior to Visit  Medication Sig Dispense Refill   amitriptyline (ELAVIL) 75 MG tablet Take 75 mg by mouth at bedtime.     Ascorbic Acid (VITAMIN C) 1000 MG tablet Take 1,000 mg by mouth daily.     aspirin 325 MG tablet Take 325 mg by mouth daily.     buPROPion (WELLBUTRIN XL) 150 MG 24 hr tablet Take 150 mg by mouth daily.     cholecalciferol (VITAMIN D3) 25 MCG (1000 UT) tablet Take 1,000 Units by mouth daily.     cloNIDine (CATAPRES - DOSED IN MG/24 HR) 0.2 mg/24hr patch Place 1 patch (0.2 mg total) onto the skin every 7 (seven) days. 4 patch 3   cloNIDine (CATAPRES - DOSED IN MG/24 HR) 0.2 mg/24hr patch 0.2 mg once a week.     esomeprazole (NEXIUM) 40 MG capsule Take 40 mg by mouth daily as needed (acid reflux).     HUMALOG KWIKPEN 200 UNIT/ML KwikPen Inject 5 Units into the skin 3 (three) times daily with meals.     hydrALAZINE (APRESOLINE) 50 MG tablet Take 1 tablet (50 mg total) by mouth 3 (three) times daily. 270 tablet 3   hydrochlorothiazide (HYDRODIURIL) 25 MG tablet Take 1 tablet (25 mg total) by mouth daily. 90 tablet 3   insulin glargine (LANTUS SOLOSTAR) 100 UNIT/ML Solostar Pen Inject 12 Units into the skin at bedtime.     JENTADUETO 2.09-998 MG TABS Take 1 tablet by mouth 2 (two) times daily.     losartan (COZAAR) 100 MG tablet Take 1 tablet (100 mg total) by mouth daily. 90 tablet 0   magnesium oxide (MAG-OX) 400 MG tablet Take 400 mg by mouth 2 (two) times daily.     methimazole (TAPAZOLE) 10 MG tablet Take 10 mg by mouth daily.     methocarbamol (ROBAXIN) 500 MG tablet Take 500 mg by mouth 2 (two) times daily as needed for muscle spasms.     metoprolol succinate (TOPROL-XL) 50 MG 24 hr tablet TAKE 1 TABLET (50 MG TOTAL) BY  MOUTH EVERY EVENING. TAKE WITH OR IMMEDIATELY FOLLOWING A MEAL. 90 tablet 0   morphine (MS CONTIN) 15 MG 12 hr tablet Take 15 mg by mouth 2 (two) times daily.     Multiple Vitamin (MULTIVITAMIN WITH MINERALS) TABS tablet Take 1 tablet by mouth daily.     Oxycodone HCl 20 MG TABS Take 20 mg by mouth 4 (four) times daily.     pregabalin (LYRICA) 200 MG capsule Take 200 mg by mouth 2 (two) times daily.     rosuvastatin (CRESTOR) 10 MG tablet Take 1 tablet by mouth at bedtime.     No facility-administered medications prior to visit.   Final Medications at End of Visit    No outpatient medications have been marked as taking for the 12/11/21 encounter (Appointment) with Rayetta Pigg, Marciana Uplinger C, PA-C.   Radiology:  No results found.  Cardiac Studies:  Abdominal aortogram and selective right and left renal arteriogram 10/03/2021: Abdominal gram debridements of 2 renal arteries 1 on either sides.  There was no significant abdominal aortic atherosclerosis or luminal irregularity.  Solitary left renal arteriogram revealed widely patent renal arteries.  Radial artery has an anterior origin from the anterior wall of the aorta.  Suspect false positive Dopplers due to tortuosity.  Recommendation: [name] blood pressure is related to poor diet and obesity.  I will start the patient on Catapres patch.  She will need continued medical therapy.  Renal artery duplex  08/04/2021:  Hemodynamically significant stenosis of the right renal artery.  No evidence of hemodynamically significant stenosis left kidney.  Renal length is within normal limits for both kidneys. 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  Two simple renal cysts (2cmx2 cm approx) seen in the right kidney. .  Mild diffuse atherosclerosis of the abdominal aorta.   Echocardiogram 03/23/2020:  1. Left ventricular ejection fraction, by estimation, is 60 to 65%. The  left ventricle has normal function. The  left ventricle has no regional  wall motion abnormalities. There is mild left ventricular hypertrophy.  Left ventricular diastolic parameters  are consistent with Grade II diastolic dysfunction (pseudonormalization).   2. Right ventricular systolic function is normal. The right ventricular  size is normal.   3. The mitral valve is normal in structure. Trivial mitral valve  regurgitation. No evidence of mitral stenosis.   4. The aortic valve is normal in structure. Aortic valve regurgitation is  not visualized. No aortic stenosis is present.   5. The inferior vena cava is normal in size with greater than 50%  respiratory variability, suggesting right atrial pressure of 3 mmHg.   EKG  09/18/2021: Sinus rhythm at a rate of 71 bpm with borderline first-degree AV block.  Normal axis.  Poor reflection, cannot exclude anteroseptal infarct old.  No evidence of ischemia or underlying injury pattern.   01/17/2021: Sinus rhythm at a rate of 77 bpm with borderline first-degree AV block.  Left atrial enlargement.  Normal axis.  Poor R wave progression, cannot exclude anteroseptal infarct old.  IVCD.  PCP EKG 01/13/2021: Sinus tachycardia with single PAC at a rate of 109 bpm.  Normal axis.  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  Left atrial enlargement.  06/03/2020: Sinus tachycardia at rate of 103 bpm, left atrial enlargement, otherwise normal EKG.   EKG 06/05/2019: Normal sinus rhythm at rate of 79 bpm, normal axis.  Poor R-wave progression, cannot exclude anteroseptal infarct old, probably normal variant.  No evidence of ischemia,  Single PAC.   Assessment   No diagnosis found.     No orders of the defined types were placed in this encounter.  There are no discontinued medications.   This patients CHA2DS2-VASc Score 4 (HTN, DM, Age, F) and yearly risk of stroke 4.8%.   Recommendations:    Brittany Carroll  is a 66 y.o. Female patient with history of atrial fibrillation S/P Cardioversion in 2008 without  recurrence, hypertension, mixed hyperlipidemia, diabetes mellitus, tobacco use, hyperthyroidism previously treated with methimazole, fibromyalgia and chronic pain.  Patient was seen in 2021 for consultation regarding remote atrial fibrillation.   Patient was last seen in our office 09/18/2021.  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  Given these findings increase rosuvastatin to 20 mg daily and shared decision was to proceed with renal angiography.  Patient underwent renal angiography 10/03/2021 which revealed patent renal arteries with no significant abdominal aortic atherosclerosis.  Conclusion of the study was that Dopplers were falsely positive likely due to tortuosity in the patient's difficult to control blood pressure is related to poor diet and obesity.  Patient was therefore started on Catapres patch.  Patient was last seen in the office 10/11/2021 at which time discussed at length regarding diet and lifestyle modifications including DASH diet and weight loss to improve hypertension control.  She at that time was also not using clonidine patches which were previously prescribed.  Patient is enrolled  in remote patient monitoring.  She presents for 8-week follow-up of hypertension. I reviewed remote monitoring data, average home blood pressure reading 136/87 mmHg.  Although blood pressure is uncontrolled in the office today it is well controlled on home monitoring.  She reports improved dietary compliance and is now using clonidine patches.  Will not make changes to medications at this time.  Suspect patient has a component of whitecoat hypertension.  Notably patient is working to quit smoking, currently smoking 2 cigarettes/day, she is encouraged carding complete smoking cessation.  Continue remote patient monitoring.  Follow-up in 6 months, sooner if needed.   Alethia Berthold, PA-C 12/11/2021, 2:24 PM Office: [phone]

## 2022-01-15 ENCOUNTER — Emergency Department (HOSPITAL_COMMUNITY): Payer: Medicare Other

## 2022-01-15 ENCOUNTER — Inpatient Hospital Stay (HOSPITAL_COMMUNITY)
Admission: EM | Admit: 2022-01-15 | Discharge: 2022-01-18 | DRG: 871 | Disposition: A | Discharge status: Home Health | Payer: Medicare Other | Source: Ambulatory Visit | Attending: Internal Medicine | Admitting: Internal Medicine

## 2022-01-15 ENCOUNTER — Encounter (HOSPITAL_COMMUNITY): Payer: Self-pay

## 2022-01-15 DIAGNOSIS — Z833 Family history of diabetes mellitus: Secondary | ICD-10-CM

## 2022-01-15 DIAGNOSIS — G894 Chronic pain syndrome: Secondary | ICD-10-CM | POA: Diagnosis present

## 2022-01-15 DIAGNOSIS — E669 Obesity, unspecified: Secondary | ICD-10-CM | POA: Diagnosis present

## 2022-01-15 DIAGNOSIS — I10 Essential (primary) hypertension: Secondary | ICD-10-CM | POA: Diagnosis present

## 2022-01-15 DIAGNOSIS — D509 Iron deficiency anemia, unspecified: Secondary | ICD-10-CM | POA: Diagnosis present

## 2022-01-15 DIAGNOSIS — N170 Acute kidney failure with tubular necrosis: Secondary | ICD-10-CM | POA: Diagnosis not present

## 2022-01-15 DIAGNOSIS — E119 Type 2 diabetes mellitus without complications: Secondary | ICD-10-CM | POA: Diagnosis present

## 2022-01-15 DIAGNOSIS — E872 Acidosis, unspecified: Secondary | ICD-10-CM | POA: Diagnosis present

## 2022-01-15 DIAGNOSIS — J189 Pneumonia, unspecified organism: Secondary | ICD-10-CM | POA: Diagnosis present

## 2022-01-15 DIAGNOSIS — T402X1A Poisoning by other opioids, accidental (unintentional), initial encounter: Secondary | ICD-10-CM | POA: Diagnosis present

## 2022-01-15 DIAGNOSIS — Z888 Allergy status to other drugs, medicaments and biological substances status: Secondary | ICD-10-CM | POA: Diagnosis not present

## 2022-01-15 DIAGNOSIS — Z981 Arthrodesis status: Secondary | ICD-10-CM

## 2022-01-15 DIAGNOSIS — E785 Hyperlipidemia, unspecified: Secondary | ICD-10-CM | POA: Diagnosis present

## 2022-01-15 DIAGNOSIS — A419 Sepsis, unspecified organism: Secondary | ICD-10-CM | POA: Diagnosis present

## 2022-01-15 DIAGNOSIS — M797 Fibromyalgia: Secondary | ICD-10-CM | POA: Diagnosis present

## 2022-01-15 DIAGNOSIS — R932 Abnormal findings on diagnostic imaging of liver and biliary tract: Secondary | ICD-10-CM | POA: Diagnosis not present

## 2022-01-15 DIAGNOSIS — G9341 Metabolic encephalopathy: Secondary | ICD-10-CM | POA: Diagnosis present

## 2022-01-15 DIAGNOSIS — Z20822 Contact with and (suspected) exposure to covid-19: Secondary | ICD-10-CM | POA: Diagnosis present

## 2022-01-15 DIAGNOSIS — I4891 Unspecified atrial fibrillation: Secondary | ICD-10-CM | POA: Diagnosis present

## 2022-01-15 DIAGNOSIS — N179 Acute kidney failure, unspecified: Secondary | ICD-10-CM | POA: Diagnosis present

## 2022-01-15 DIAGNOSIS — Z6836 Body mass index (BMI) 36.0-36.9, adult: Secondary | ICD-10-CM

## 2022-01-15 DIAGNOSIS — M5416 Radiculopathy, lumbar region: Secondary | ICD-10-CM | POA: Diagnosis present

## 2022-01-15 DIAGNOSIS — H3530 Unspecified macular degeneration: Secondary | ICD-10-CM | POA: Diagnosis present

## 2022-01-15 DIAGNOSIS — Z794 Long term (current) use of insulin: Secondary | ICD-10-CM | POA: Diagnosis not present

## 2022-01-15 DIAGNOSIS — R4182 Altered mental status, unspecified: Secondary | ICD-10-CM

## 2022-01-15 DIAGNOSIS — Z83438 Family history of other disorder of lipoprotein metabolism and other lipidemia: Secondary | ICD-10-CM

## 2022-01-15 DIAGNOSIS — R6520 Severe sepsis without septic shock: Secondary | ICD-10-CM

## 2022-01-15 DIAGNOSIS — E0590 Thyrotoxicosis, unspecified without thyrotoxic crisis or storm: Secondary | ICD-10-CM | POA: Diagnosis present

## 2022-01-15 DIAGNOSIS — E1100 Type 2 diabetes mellitus with hyperosmolarity without nonketotic hyperglycemic-hyperosmolar coma (NKHHC): Secondary | ICD-10-CM

## 2022-01-15 DIAGNOSIS — I959 Hypotension, unspecified: Secondary | ICD-10-CM | POA: Diagnosis present

## 2022-01-15 DIAGNOSIS — Z7984 Long term (current) use of oral hypoglycemic drugs: Secondary | ICD-10-CM

## 2022-01-15 DIAGNOSIS — N178 Other acute kidney failure: Secondary | ICD-10-CM | POA: Diagnosis not present

## 2022-01-15 DIAGNOSIS — Z79899 Other long term (current) drug therapy: Secondary | ICD-10-CM

## 2022-01-15 DIAGNOSIS — Z8249 Family history of ischemic heart disease and other diseases of the circulatory system: Secondary | ICD-10-CM

## 2022-01-15 DIAGNOSIS — Z79891 Long term (current) use of opiate analgesic: Secondary | ICD-10-CM | POA: Diagnosis not present

## 2022-01-15 DIAGNOSIS — F17210 Nicotine dependence, cigarettes, uncomplicated: Secondary | ICD-10-CM | POA: Diagnosis present

## 2022-01-15 LAB — I-STAT VENOUS BLOOD GAS, ED
Acid-base deficit: 7 mmol/L — ABNORMAL HIGH (ref 0.0–2.0)
Bicarbonate: 19 mmol/L — ABNORMAL LOW (ref 20.0–28.0)
Calcium, Ion: 1.06 mmol/L — ABNORMAL LOW (ref 1.15–1.40)
HCT: 31 % — ABNORMAL LOW (ref 36.0–46.0)
Hemoglobin: 10.5 g/dL — ABNORMAL LOW (ref 12.0–15.0)
O2 Saturation: 99 %
Potassium: 5 mmol/L (ref 3.5–5.1)
Sodium: 130 mmol/L — ABNORMAL LOW (ref 135–145)
TCO2: 20 mmol/L — ABNORMAL LOW (ref 22–32)
pCO2, Ven: 37.5 mmHg — ABNORMAL LOW (ref 44–60)
pH, Ven: 7.314 (ref 7.25–7.43)
pO2, Ven: 129 mmHg — ABNORMAL HIGH (ref 32–45)

## 2022-01-15 LAB — CBC WITH DIFFERENTIAL/PLATELET
Abs Immature Granulocytes: 0.09 10*3/uL — ABNORMAL HIGH (ref 0.00–0.07)
Basophils Absolute: 0.1 10*3/uL (ref 0.0–0.1)
Basophils Relative: 0 %
Eosinophils Absolute: 0.1 10*3/uL (ref 0.0–0.5)
Eosinophils Relative: 0 %
HCT: 32.3 % — ABNORMAL LOW (ref 36.0–46.0)
Hemoglobin: 10.6 g/dL — ABNORMAL LOW (ref 12.0–15.0)
Immature Granulocytes: 1 %
Lymphocytes Relative: 22 %
Lymphs Abs: 3.4 10*3/uL (ref 0.7–4.0)
MCH: 25.1 pg — ABNORMAL LOW (ref 26.0–34.0)
MCHC: 32.8 g/dL (ref 30.0–36.0)
MCV: 76.4 fL — ABNORMAL LOW (ref 80.0–100.0)
Monocytes Absolute: 1.3 10*3/uL — ABNORMAL HIGH (ref 0.1–1.0)
Monocytes Relative: 9 %
Neutro Abs: 10.4 10*3/uL — ABNORMAL HIGH (ref 1.7–7.7)
Neutrophils Relative %: 68 %
Platelets: 322 10*3/uL (ref 150–400)
RBC: 4.23 MIL/uL (ref 3.87–5.11)
RDW: 16 % — ABNORMAL HIGH (ref 11.5–15.5)
WBC: 15.3 10*3/uL — ABNORMAL HIGH (ref 4.0–10.5)
nRBC: 0 % (ref 0.0–0.2)

## 2022-01-15 LAB — I-STAT CHEM 8, ED
BUN: 113 mg/dL — ABNORMAL HIGH (ref 8–23)
Calcium, Ion: 1.04 mmol/L — ABNORMAL LOW (ref 1.15–1.40)
Chloride: 97 mmol/L — ABNORMAL LOW (ref 98–111)
Creatinine, Ser: 8.3 mg/dL — ABNORMAL HIGH (ref 0.44–1.00)
Glucose, Bld: 112 mg/dL — ABNORMAL HIGH (ref 70–99)
HCT: 32 % — ABNORMAL LOW (ref 36.0–46.0)
Hemoglobin: 10.9 g/dL — ABNORMAL LOW (ref 12.0–15.0)
Potassium: 5 mmol/L (ref 3.5–5.1)
Sodium: 129 mmol/L — ABNORMAL LOW (ref 135–145)
TCO2: 20 mmol/L — ABNORMAL LOW (ref 22–32)

## 2022-01-15 LAB — BASIC METABOLIC PANEL
Anion gap: 16 — ABNORMAL HIGH (ref 5–15)
BUN: 95 mg/dL — ABNORMAL HIGH (ref 8–23)
CO2: 17 mmol/L — ABNORMAL LOW (ref 22–32)
Calcium: 8.2 mg/dL — ABNORMAL LOW (ref 8.9–10.3)
Chloride: 97 mmol/L — ABNORMAL LOW (ref 98–111)
Creatinine, Ser: 7.08 mg/dL — ABNORMAL HIGH (ref 0.44–1.00)
GFR, Estimated: 6 mL/min — ABNORMAL LOW (ref >60)
Glucose, Bld: 110 mg/dL — ABNORMAL HIGH (ref 70–99)
Potassium: 5.3 mmol/L — ABNORMAL HIGH (ref 3.5–5.1)
Sodium: 130 mmol/L — ABNORMAL LOW (ref 135–145)

## 2022-01-15 LAB — COMPREHENSIVE METABOLIC PANEL
ALT: 56 U/L — ABNORMAL HIGH (ref 0–44)
AST: 146 U/L — ABNORMAL HIGH (ref 15–41)
Albumin: 3.3 g/dL — ABNORMAL LOW (ref 3.5–5.0)
Alkaline Phosphatase: 84 U/L (ref 38–126)
Anion gap: 19 — ABNORMAL HIGH (ref 5–15)
BUN: 96 mg/dL — ABNORMAL HIGH (ref 8–23)
CO2: 18 mmol/L — ABNORMAL LOW (ref 22–32)
Calcium: 8.7 mg/dL — ABNORMAL LOW (ref 8.9–10.3)
Chloride: 94 mmol/L — ABNORMAL LOW (ref 98–111)
Creatinine, Ser: 7.81 mg/dL — ABNORMAL HIGH (ref 0.44–1.00)
GFR, Estimated: 5 mL/min — ABNORMAL LOW (ref >60)
Glucose, Bld: 103 mg/dL — ABNORMAL HIGH (ref 70–99)
Potassium: 5.2 mmol/L — ABNORMAL HIGH (ref 3.5–5.1)
Sodium: 131 mmol/L — ABNORMAL LOW (ref 135–145)
Total Bilirubin: 0.5 mg/dL (ref 0.3–1.2)
Total Protein: 6.9 g/dL (ref 6.5–8.1)

## 2022-01-15 LAB — SALICYLATE LEVEL: Salicylate Lvl: <7 mg/dL — ABNORMAL LOW (ref 7.0–30.0)

## 2022-01-15 LAB — PROCALCITONIN: Procalcitonin: 1.85 ng/mL

## 2022-01-15 LAB — MAGNESIUM: Magnesium: 1.6 mg/dL — ABNORMAL LOW (ref 1.7–2.4)

## 2022-01-15 LAB — ACETAMINOPHEN LEVEL: Acetaminophen (Tylenol), Serum: <10 ug/mL — ABNORMAL LOW (ref 10–30)

## 2022-01-15 LAB — PROTIME-INR
INR: 1.2 (ref 0.8–1.2)
Prothrombin Time: 15.1 seconds (ref 11.4–15.2)

## 2022-01-15 LAB — LACTIC ACID, PLASMA: Lactic Acid, Venous: 2.2 mmol/L (ref 0.5–1.9)

## 2022-01-15 LAB — CBG MONITORING, ED: Glucose-Capillary: 115 mg/dL — ABNORMAL HIGH (ref 70–99)

## 2022-01-15 LAB — ETHANOL: Alcohol, Ethyl (B): <10 mg/dL (ref <10)

## 2022-01-15 LAB — TSH: TSH: 0.299 u[IU]/mL — ABNORMAL LOW (ref 0.350–4.500)

## 2022-01-15 LAB — T4, FREE: Free T4: 0.78 ng/dL (ref 0.61–1.12)

## 2022-01-15 LAB — AMMONIA: Ammonia: 39 umol/L — ABNORMAL HIGH (ref 9–35)

## 2022-01-15 MED ORDER — ACETAMINOPHEN 325 MG PO TABS
650.0000 mg | ORAL_TABLET | Freq: Four times a day (QID) | ORAL | Status: DC | PRN
  Administered 2022-01-16 – 2022-01-17 (×3): 650 mg via ORAL
  Filled 2022-01-15 (×4): qty 2

## 2022-01-15 MED ORDER — HEPARIN SODIUM (PORCINE) 5000 UNIT/ML IJ SOLN
5000.0000 [IU] | Freq: Three times a day (TID) | INTRAMUSCULAR | Status: DC
  Administered 2022-01-15 – 2022-01-18 (×8): 5000 [IU] via SUBCUTANEOUS
  Filled 2022-01-15 (×8): qty 1

## 2022-01-15 MED ORDER — LACTATED RINGERS IV BOLUS
1000.0000 mL | Freq: Once | INTRAVENOUS | Status: AC
  Administered 2022-01-15: 1000 mL via INTRAVENOUS

## 2022-01-15 MED ORDER — SODIUM CHLORIDE 0.9 % IV SOLN
500.0000 mg | Freq: Once | INTRAVENOUS | Status: AC
  Administered 2022-01-15: 500 mg via INTRAVENOUS
  Filled 2022-01-15: qty 5

## 2022-01-15 MED ORDER — ONDANSETRON HCL 4 MG/2ML IJ SOLN: 4.0000 mg | Freq: Four times a day (QID) | INTRAMUSCULAR | Status: DC | PRN

## 2022-01-15 MED ORDER — SODIUM CHLORIDE 0.9 % IV SOLN
1.0000 g | Freq: Once | INTRAVENOUS | Status: AC
  Administered 2022-01-15: 1 g via INTRAVENOUS
  Filled 2022-01-15: qty 10

## 2022-01-15 MED ORDER — NALOXONE HCL 0.4 MG/ML IJ SOLN
INTRAMUSCULAR | Status: AC
  Administered 2022-01-15: 0.4 mg
  Filled 2022-01-15: qty 1

## 2022-01-15 MED ORDER — SODIUM CHLORIDE 0.9 % IV SOLN: INTRAVENOUS | Status: DC

## 2022-01-15 MED ORDER — ONDANSETRON HCL 4 MG PO TABS: 4.0000 mg | ORAL_TABLET | Freq: Four times a day (QID) | ORAL | Status: DC | PRN

## 2022-01-15 MED ORDER — METHIMAZOLE 10 MG PO TABS
10.0000 mg | ORAL_TABLET | Freq: Every day | ORAL | Status: DC
  Administered 2022-01-16 – 2022-01-18 (×4): 10 mg via ORAL
  Filled 2022-01-15 (×4): qty 1

## 2022-01-15 MED ORDER — ACETAMINOPHEN 650 MG RE SUPP: 650.0000 mg | Freq: Four times a day (QID) | RECTAL | Status: DC | PRN

## 2022-01-15 NOTE — Assessment & Plan Note (Signed)
?   Cirrhosis on CT LFTs nl, ammonia not particularly elevated 1. Work up as [employer].

## 2022-01-15 NOTE — Assessment & Plan Note (Addendum)
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 Ammonia nl No hypercapnea and not acidotic on VBG Hyperthyroidism present but doubt this playing the major role / storm today (see hyperthyroidism below). Delirium secondary to infection also possible. 1. IVF bolus ongoing in ED 2. Nephrology consult re: uremic encephalopathy 3. CT AP pending

## 2022-01-15 NOTE — ED Notes (Signed)
MD notified that first LR bolus is still infusing - IV team order still in

## 2022-01-15 NOTE — Assessment & Plan Note (Addendum)
Question of sepsis 1. No fever, does have WBC and hypotension initially 2. IVF boluses as above 3. Question of PNA on CXR 4. Tele monitor 5. EDP started empiric rocephin + azithro 1. Will hold off on further ABx orders for the moment. 6. Check procalcitonin 7. Check COVID-19 8. Trending lactate

## 2022-01-15 NOTE — Assessment & Plan Note (Addendum)
Pt on tapazole but taking meds intermittently over last couple of days-weeks per daughter. TSH elevated today Thyroid storm seems less likely:  Burch and Wartofsky thyroid storm score only 15 today, and even this requires [place] to attribute the GI symptoms, + tachycardia to thyroid and not the BUN of 112. 1. Resume tapazole 2. Check T4

## 2022-01-15 NOTE — ED Notes (Signed)
Able to get one set of blood cultures at this time - MD notified

## 2022-01-15 NOTE — ED Triage Notes (Signed)
Pt BIB [facility] from PCP for hypotension, diarrhea, back pain X 4-5 days. Per EMS, pt has also had increasing confusion X 2 months.

## 2022-01-15 NOTE — H&P (Signed)
History and Physical    Patient: Brittany Carroll IWL:[id number] DOB: 04-Nov-1955 DOA: 01/15/2022 DOS: the patient was seen and examined on 01/15/2022 PCP: Ellyn Hack, MD  Patient coming from: Home  Chief Complaint: Altered mental status HPI: Brittany Carroll is a 66 y.o. female with medical history significant of HTN, DM2.  Pt on increasing doses of multiple BP meds over past few months trying to control resistant HTN.  Renal US was suspicious for RAS; however, angiography done a few months back to look for RAS was negative for RAS and RAS ruled out.  Pt AAOx4 at baseline.  Over past few weeks having loss of appetitive, not taking meds regularly, increasing AMS.  Progressively worse over past few days with poor PO intake, 4-5 days of NB diarrhea.  No fevers.  Notes decreased and now no urine output.  Family notes that she has had a gradual decline in mental status over the past 3 weeks and has been much more forgetful than normal.  Went in to PCP, BP reportedly 80/50s with AMS and pt sent in to ED.  Pt wakes up to voice, though a bit confused / altered.  But still mentating some.  Oriented to self, location, not really date. Review of Systems: As mentioned in the history of present illness. All other systems reviewed and are negative. Past Medical History:  Diagnosis Date   Cataract    OU   Diabetes mellitus without complication (HCC)    Fibromyalgia    History of atrial fibrillation without current medication    2008 in [place] had cardioversion   Hyperlipidemia    Hypertension    Macular degeneration    Past Surgical History:  Procedure Laterality Date   CERVICAL FUSION     ECTOPIC PREGNANCY SURGERY     LUMBAR FUSION     PARTIAL HYSTERECTOMY     RENAL ANGIOGRAPHY N/A 10/03/2021   Procedure: RENAL ANGIOGRAPHY;  Surgeon: Yates Decamp, MD;  Location: [facility];  Service: Cardiovascular;  Laterality: N/A;   Social History:  reports that she has been smoking cigarettes. She  has a 17.50 pack-year smoking history. She has never used smokeless tobacco. She reports that she does not currently use alcohol. She reports that she does not currently use drugs after having used the following drugs: Heroin, Cocaine, and Marijuana.  Allergies  Allergen Reactions   Lisinopril Hives   Metformin And Related Hives    Family History  Problem Relation Age of Onset   Atrial fibrillation Mother    Hypertension Mother    Hyperlipidemia Mother    Leukemia Father    Diabetes Brother    Hypertension Brother    Hyperlipidemia Brother    Diabetes Brother    Hyperlipidemia Brother    Hypertension Brother     Prior to Admission medications   Medication Sig Start Date End Date Taking? Authorizing Provider  amitriptyline (ELAVIL) 75 MG tablet Take 75 mg by mouth at bedtime. 11/15/20   [provider]  Ascorbic Acid (VITAMIN C) 1000 MG tablet Take 1,000 mg by mouth daily.    [provider]  buPROPion (WELLBUTRIN XL) 150 MG 24 hr tablet Take 150 mg by mouth daily.    [provider]  cholecalciferol (VITAMIN D3) 25 MCG (1000 UT) tablet Take 1,000 Units by mouth daily.    [provider]  cloNIDine (CATAPRES - DOSED IN MG/24 HR) 0.2 mg/24hr patch Place 1 patch (0.2 mg total) onto the skin every 7 (  seven) days. 10/03/21 10/03/22  Yates Decamp, MD  cloNIDine (CATAPRES - DOSED IN MG/24 HR) 0.2 mg/24hr patch 0.2 mg once a week. 10/04/21   [provider]  esomeprazole (NEXIUM) 40 MG capsule Take 40 mg by mouth daily as needed (acid reflux).    [provider]  gabapentin (NEURONTIN) 800 MG tablet TAKE 1 TABLET(S) THREE TIMES A DAY BY ORAL ROUTE FOR 30 DAYS. 03/03/19   [provider]  HUMALOG KWIKPEN 200 UNIT/ML KwikPen Inject 5 Units into the skin 3 (three) times daily with meals. 06/29/21   [provider]  hydrALAZINE (APRESOLINE) 50 MG tablet Take 1 tablet (50 mg total) by mouth 3 (three) times daily. 06/19/21 06/14/22   Cantwell, Celeste C, PA-C  hydrochlorothiazide (HYDRODIURIL) 25 MG tablet Take 1 tablet (25 mg total) by mouth daily. 03/15/21 03/10/22  Cantwell, Celeste C, PA-C  insulin glargine (LANTUS SOLOSTAR) 100 UNIT/ML Solostar Pen Inject 12 Units into the skin at bedtime.    [provider]  JENTADUETO 2.09-998 MG TABS Take 1 tablet by mouth 2 (two) times daily. 11/09/20   [provider]  losartan (COZAAR) 100 MG tablet Take 1 tablet (100 mg total) by mouth daily. 03/22/21   Yates Decamp, MD  magnesium oxide (MAG-OX) 400 MG tablet Take 400 mg by mouth 2 (two) times daily.    [provider]  methimazole (TAPAZOLE) 10 MG tablet Take 10 mg by mouth daily.    [provider]  methocarbamol (ROBAXIN) 500 MG tablet Take 500 mg by mouth 2 (two) times daily as needed for muscle spasms. 05/30/21   [provider]  metoprolol succinate (TOPROL-XL) 50 MG 24 hr tablet TAKE 1 TABLET (50 MG TOTAL) BY MOUTH EVERY EVENING. TAKE WITH OR IMMEDIATELY FOLLOWING A MEAL. 04/03/21 01/15/22  Yates Decamp, MD  morphine (MS CONTIN) 15 MG 12 hr tablet Take 15 mg by mouth 2 (two) times daily.    [provider]  Multiple Vitamin (MULTIVITAMIN WITH MINERALS) TABS tablet Take 1 tablet by mouth daily.    [provider]  Oxycodone HCl 20 MG TABS Take 20 mg by mouth 4 (four) times daily. 05/30/19   [provider]  pregabalin (LYRICA) 200 MG capsule Take 200 mg by mouth 2 (two) times daily. 01/05/21   [provider]  rosuvastatin (CRESTOR) 10 MG tablet Take 1 tablet by mouth at bedtime.    [provider]    Physical Exam: Vitals:   01/15/22 2030 01/15/22 2035 01/15/22 2045 01/15/22 2130  BP: (!) 112/58  105/64 (!) 104/52  Pulse: (!) 103 100 99 (!) 102  Resp: 13 12 12 14   Temp:      TempSrc:      SpO2: 94% 93% 96% 95%  Weight:      Height:       Constitutional: NAD, calm, comfortable, sleepy Eyes: PERRL, lids and conjunctivae normal ENMT: Mucous  membranes are moist. Posterior pharynx clear of any exudate or lesions.Normal dentition.  Neck: normal, supple, no masses, no thyromegaly Respiratory: clear to auscultation bilaterally, no wheezing, no crackles. Normal respiratory effort. No accessory muscle use.  Cardiovascular: Regular rate and rhythm, no murmurs / rubs / gallops. No extremity edema. 2+ pedal pulses. No carotid bruits.  Abdomen: no tenderness, no masses palpated. No hepatosplenomegaly. Bowel sounds positive.  Musculoskeletal: no clubbing / cyanosis. No joint deformity upper and lower extremities. Good ROM, no contractures. Normal muscle tone.  Skin: no rashes, lesions, ulcers. No induration Neurologic: CN 2-12  grossly intact. Sensation intact, DTR normal. Strength 5/5 in all 4. Grossly non-focal Psychiatric: Oriented to self, location, not so sure about date.  Data Reviewed:       Latest Ref Rng & Units 01/15/2022   10:04 PM 01/15/2022    8:19 PM 01/15/2022    5:20 PM  CMP  Glucose 70 - 99 mg/dL 678  938  101   BUN 8 - 23 mg/dL 95  751  96   Creatinine 0.44 - 1.00 mg/dL 0.25  8.52  7.78   Sodium 135 - 145 mmol/L 130  129    130  131   Potassium 3.5 - 5.1 mmol/L 5.3  5.0    5.0  5.2   Chloride 98 - 111 mmol/L 97  97  94   CO2 22 - 32 mmol/L 17   18   Calcium 8.9 - 10.3 mg/dL 8.2   8.7   Total Protein 6.5 - 8.1 g/dL   6.9   Total Bilirubin 0.3 - 1.2 mg/dL   0.5   Alkaline Phos 38 - 126 U/L   84   AST 15 - 41 U/L   146   ALT 0 - 44 U/L   56       Latest Ref Rng & Units 01/15/2022    8:19 PM 01/15/2022    5:20 PM 09/27/2021    2:43 PM  CBC  WBC 4.0 - 10.5 K/uL  15.3  7.8   Hemoglobin 12.0 - 15.0 g/dL 24.2 - 35.3 g/dL 61.4    43.1  54.0  08.6   Hematocrit 36.0 - 46.0 % 36.0 - 46.0 % 32.0    31.0  32.3  34.6   Platelets 150 - 400 K/uL  322  238    CT AP: IMPRESSION: 1. Heterogeneous consolidations in the left greater than right lung base which may be due to atelectasis, pneumonia or aspiration. 2. Suspect  subtle contour nodularity of the liver as may be seen with cirrhosis. Correlate for risk factors 3. Scattered fluid-filled nondilated small and large bowel without definitive point of obstruction. Findings could be secondary to mild ileus or enteritis. No acute bowel wall thickening is seen.  CT head: nothing acute  CXR: IMPRESSION: Ill-defined opacities in both lung bases and left midlung zone, may represent atelectasis versus multifocal infection.    Assessment and Plan: * Acute metabolic encephalopathy Worrisome for uremic encephalopathy with BUN 113 Unintentional opiate OD (due to taking normal dose of chronic prescription opiates in setting of AKF) considered, but no real improvement in mental status post narcan per ED. Ammonia nl No hypercapnea and not acidotic on VBG Hyperthyroidism present but doubt this playing the major role / storm today (see hyperthyroidism below). Delirium secondary to infection also possible. 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 Concerned about uremia causing N/V/D + AMS with BUN 112. 2L IVF bolus in ED Getting stat nephrology consult  Sepsis [facility]) Question of sepsis No fever, does have WBC and hypotension initially IVF boluses as above Question of PNA on CXR Tele monitor EDP started empiric rocephin + azithro Will hold off on further ABx orders for the moment. Check procalcitonin Check COVID-19 Trending lactate  Hypotension Sepsis vs BP meds vs ? Hasnt been regularly taking BP meds at home over past couple of weeks per daughter. Hold home BP meds, remove clonidine patch.  IVF boluses going in ED  Abnormal liver CT ? Cirrhosis on CT LFTs nl, ammonia not particularly elevated Work up as outpt.  DM2 (diabetes mellitus, type 2) (HCC) Hold home meds CBG checks Q4H for the moment Add very low dose SSI if  needed.  Hyperthyroidism Pt on tapazole but taking meds intermittently over last couple of days-weeks per daughter. TSH elevated today Thyroid storm seems less likely:  Burch and Wartofsky thyroid storm score only 15 today, and even this requires [place] to attribute the GI symptoms, + tachycardia to thyroid and not the BUN of 112. Resume tapazole Check T4  Chronic prescription opiate use Hold home opiates for the moment due to: 1) AMS 2) Acute kidney failure.  Resistant hypertension Hypotensive in ED, hold home BP meds as above.      Advance Care Planning:   Code Status: Full Code  Consults: nephrology  Family Communication: No family in room  Severity of Illness: The appropriate patient status for this patient is INPATIENT. Inpatient status is judged to be reasonable and necessary in order to provide the required intensity of service to ensure the patient's safety. The patient's presenting symptoms, physical exam findings, and initial radiographic and laboratory data in the context of their chronic comorbidities is felt to place them at high risk for further clinical deterioration. Furthermore, it is not anticipated that the patient will be medically stable for discharge from the hospital within 2 midnights of admission.   * I certify that at the point of admission it is my clinical judgment that the patient will require inpatient hospital care spanning beyond 2 midnights from the point of admission due to high intensity of service, high risk for further deterioration and high frequency of surveillance required.*  Author: Hillary Bow., DO 01/15/2022 10:24 PM  For on call review [email or web address].

## 2022-01-15 NOTE — Assessment & Plan Note (Signed)
Hold home meds CBG checks Q4H for the moment Add very low dose SSI if needed.

## 2022-01-15 NOTE — Assessment & Plan Note (Addendum)
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 Concerned about uremia causing N/V/D + AMS with BUN 112. 1. 2L IVF bolus in ED 2. Getting stat nephrology consult

## 2022-01-15 NOTE — ED Notes (Signed)
Pt taken to CT.

## 2022-01-15 NOTE — ED Notes (Signed)
Dr. Gardner at bedside 

## 2022-01-15 NOTE — Assessment & Plan Note (Signed)
Hold home opiates for the moment due to: 1) AMS 2) Acute kidney failure.

## 2022-01-15 NOTE — Assessment & Plan Note (Signed)
Hypotensive in ED, hold home BP meds as above.

## 2022-01-15 NOTE — Assessment & Plan Note (Addendum)
Sepsis vs BP meds vs ? Hasnt been regularly taking BP meds at home over past couple of weeks per daughter. 1. Hold home BP meds, remove clonidine patch. 2. IVF boluses going in ED

## 2022-01-15 NOTE — ED Notes (Signed)
Pt more alert now and asking for something to eat. Pt still unable to urinate at this time, MD notified, no need to repeat bladder scan at this time

## 2022-01-15 NOTE — ED Notes (Signed)
MD notified of bladder scan results. IV team at bedside

## 2022-01-15 NOTE — ED Provider Notes (Signed)
[facility] EMERGENCY DEPARTMENT Provider Note   CSN: [id number] Arrival date & time: 01/15/22  1650     History  No chief complaint on file.   Brittany Carroll is a 66 y.o. female.  With PSHx as listed listed below who presents to the emergency department with altered mental status and hypotension.  The patient was reportedly at a primary care physician office visit where she was found to be hypotensive to the 80s over 50s and also altered.  The patient is reportedly fully alert and oriented at baseline.  Upon arrival, the patient is somnolent but is able to participate in interview to limited extent.  She notes that she has had 4 to 5 days of diarrhea that is nonbloody.  She has had no fevers to her recollection.  She has had no other symptoms except for loss of appetite.  She states that she has not eaten or drank much over the past several days.  Family notes that she has had a gradual decline in mental status over the past 3 weeks and has been much more forgetful than normal, which is what prompted them to bring her to the primary care physician in the first place.  Patient has no other complaints at this time and specifically denies headache, chest pain, fever, shortness of breath, or abdominal pain.  HPI    Past Medical History:  Diagnosis Date   Cataract    OU   Diabetes mellitus without complication (HCC)    Fibromyalgia    History of atrial fibrillation without current medication    2008 in [place] had cardioversion   Hyperlipidemia    Hypertension    Macular degeneration      Home Medications Prior to Admission medications   Medication Sig Start Date End Date Taking? Authorizing Provider  amitriptyline (ELAVIL) 75 MG tablet Take 75 mg by mouth at bedtime. 11/15/20   [provider]  Ascorbic Acid (VITAMIN C) 1000 MG tablet Take 1,000 mg by mouth daily.    [provider]  aspirin 325 MG tablet Take 325 mg by mouth daily.    [provider]  buPROPion (WELLBUTRIN XL) 150 MG 24 hr tablet Take 150 mg by mouth daily.    [provider]  cholecalciferol (VITAMIN D3) 25 MCG (1000 UT) tablet Take 1,000 Units by mouth daily.    [provider]  cloNIDine (CATAPRES - DOSED IN MG/24 HR) 0.2 mg/24hr patch Place 1 patch (0.2 mg total) onto the skin every 7 (seven) days. 10/03/21 10/03/22  Yates Decamp, MD  cloNIDine (CATAPRES - DOSED IN MG/24 HR) 0.2 mg/24hr patch 0.2 mg once a week. 10/04/21   [provider]  esomeprazole (NEXIUM) 40 MG capsule Take 40 mg by mouth daily as needed (acid reflux).    [provider]  gabapentin (NEURONTIN) 800 MG tablet TAKE 1 TABLET(S) THREE TIMES A DAY BY ORAL ROUTE FOR 30 DAYS. 03/03/19   [provider]  HUMALOG KWIKPEN 200 UNIT/ML KwikPen Inject 5 Units into the skin 3 (three) times daily with meals. 06/29/21   [provider]  hydrALAZINE (APRESOLINE) 50 MG tablet Take 1 tablet (50 mg total) by mouth 3 (three) times daily. 06/19/21 06/14/22  Cantwell, Celeste C, PA-C  hydrochlorothiazide (HYDRODIURIL) 25 MG tablet Take 1 tablet (25 mg total) by mouth daily. 03/15/21 03/10/22  Cantwell, Celeste C, PA-C  insulin glargine (LANTUS SOLOSTAR) 100 UNIT/ML Solostar Pen Inject 12 Units into the skin at bedtime.  [provider]  JENTADUETO 2.09-998 MG TABS Take 1 tablet by mouth 2 (two) times daily. 11/09/20   [provider]  losartan (COZAAR) 100 MG tablet Take 1 tablet (100 mg total) by mouth daily. 03/22/21   Yates Decamp, MD  magnesium oxide (MAG-OX) 400 MG tablet Take 400 mg by mouth 2 (two) times daily.    [provider]  methimazole (TAPAZOLE) 10 MG tablet Take 10 mg by mouth daily.    [provider]  methocarbamol (ROBAXIN) 500 MG tablet Take 500 mg by mouth 2 (two) times daily as needed for muscle spasms. 05/30/21   [provider]  metoprolol succinate (TOPROL-XL) 50 MG 24 hr tablet TAKE 1 TABLET (50 MG  TOTAL) BY MOUTH EVERY EVENING. TAKE WITH OR IMMEDIATELY FOLLOWING A MEAL. 04/03/21 12/11/21  Yates Decamp, MD  morphine (MS CONTIN) 15 MG 12 hr tablet Take 15 mg by mouth 2 (two) times daily.    [provider]  Multiple Vitamin (MULTIVITAMIN WITH MINERALS) TABS tablet Take 1 tablet by mouth daily.    [provider]  Oxycodone HCl 20 MG TABS Take 20 mg by mouth 4 (four) times daily. 05/30/19   [provider]  pregabalin (LYRICA) 200 MG capsule Take 200 mg by mouth 2 (two) times daily. 01/05/21   [provider]  rosuvastatin (CRESTOR) 10 MG tablet Take 1 tablet by mouth at bedtime.    [provider]      Allergies    Lisinopril and Metformin and related    Review of Systems   Review of Systems  Unable to perform ROS: Mental status change    Physical Exam Updated Vital Signs There were no vitals taken for this visit. Physical Exam Vitals and nursing note reviewed.  Constitutional:      General: She is not in acute distress.    Appearance: She is well-developed.     Comments: Upon entering the exam room, the patient is lying in bed in no distress.  She is somnolent but is easily arousable.  She is somewhat slow to answer questions.  She does not appear toxic  HENT:     Head: Normocephalic and atraumatic.     Mouth/Throat:     Mouth: Mucous membranes are dry.  Eyes:     Conjunctiva/sclera: Conjunctivae normal.  Neck:     Comments: No nuchal rigidity. Cardiovascular:     Rate and Rhythm: Normal rate and regular rhythm.     Heart sounds: No murmur heard.    Comments: Low-grade sinus tachycardia appreciated on the monitor.  No murmurs or gallops on exam Pulmonary:     Effort: Pulmonary effort is normal. No respiratory distress.     Breath sounds: Normal breath sounds.     Comments: CTAB.  Saturating in the low 90s on room air while awake.  No evidence of respiratory distress. Abdominal:     Palpations: Abdomen is soft.     Tenderness:  There is no abdominal tenderness.     Comments: Soft nondistended tender.  No flank tenderness.  Musculoskeletal:        General: No swelling.     Cervical back: Neck supple.  Skin:    General: Skin is warm and dry.     Capillary Refill: Capillary refill takes less than 2 seconds.  Neurological:     Mental Status: She is alert.     Comments: Is somnolent.  She is oriented to person, place but not to time.  She is vaguely oriented to situation.  Is following commands in all 4 extremities.  Psychiatric:        Mood and Affect: Mood normal.     ED Results / Procedures / Treatments   Labs (all labs ordered are listed, but only abnormal results are displayed) Labs Reviewed - No data to display  EKG None  Radiology No results found.  Procedures Procedures    Medications Ordered in ED Medications - No data to display  ED Course/ Medical Decision Making/ A&P                           Medical Decision Making Amount and/or Complexity of Data Reviewed Labs: ordered. Radiology: ordered.  Risk Prescription drug management. Decision regarding hospitalization.   Patient presents the emergency department with several weeks of altered mental status, hypotension noted at the PCPs office, mildly tachycardic with dry mucous membranes on exam.  Differential diagnosis includes toxic metabolic encephalopathy versus intracranial hemorrhage versus unintentional opioid overdose.  Will obtain broad altered mental status work-up including CT head, chest x-ray and blood work.  I personally reviewed interpret the patient's laboratory work-up which was remarkable for new onset and severe renal failure with a BUN approximately 100 and a creatinine approximately 8.  Patient is mildly hyperkalemic to 5.2.  I personally reviewed and interpreted the patient's EKG which was unremarkable for changes consistent with hyperkalemia and or ischemia.  I personally reviewed and interpreted the patient's CT  head which was grossly unremarkable for acute intracranial pathology such as hemorrhage.  Chest x-ray revealed questionable bibasilar pneumonia.  We will treat empirically with Rocephin and azithromycin.  [name] blood pressures upon appropriately to intravenous fluids.  Patient is received a total of 2 L of intravenous fluids.  Nephrology was consulted per request of hospitalist.  Per request of nephrology CT scan of the abdomen pelvis without contrast was ordered.  Patient was ultimately admitted to hospitalist medicine for continued work-up of her acute renal failure.         Final Clinical Impression(s) / ED Diagnoses Final diagnoses:  Acute renal failure, unspecified acute renal failure type (HCC)  Altered mental status, unspecified altered mental status type  Metabolic encephalopathy    Rx / DC Orders ED Discharge Orders     None         Duard Brady, MD 01/15/22 [id number]    Gwyneth Sprout, MD 01/16/22 0003

## 2022-01-16 ENCOUNTER — Encounter (HOSPITAL_COMMUNITY): Payer: Self-pay | Admitting: Internal Medicine

## 2022-01-16 ENCOUNTER — Other Ambulatory Visit: Payer: Self-pay

## 2022-01-16 ENCOUNTER — Inpatient Hospital Stay (HOSPITAL_COMMUNITY): Payer: Medicare Other

## 2022-01-16 DIAGNOSIS — G9341 Metabolic encephalopathy: Secondary | ICD-10-CM | POA: Diagnosis not present

## 2022-01-16 LAB — PROTEIN / CREATININE RATIO, URINE
Creatinine, Urine: 144 mg/dL
Protein Creatinine Ratio: 0.49 mg/mg{Cre} — ABNORMAL HIGH (ref 0.00–0.15)
Total Protein, Urine: 71 mg/dL

## 2022-01-16 LAB — CBC
HCT: 31.2 % — ABNORMAL LOW (ref 36.0–46.0)
Hemoglobin: 10 g/dL — ABNORMAL LOW (ref 12.0–15.0)
MCH: 25.1 pg — ABNORMAL LOW (ref 26.0–34.0)
MCHC: 32.1 g/dL (ref 30.0–36.0)
MCV: 78.2 fL — ABNORMAL LOW (ref 80.0–100.0)
Platelets: 267 10*3/uL (ref 150–400)
RBC: 3.99 MIL/uL (ref 3.87–5.11)
RDW: 16.1 % — ABNORMAL HIGH (ref 11.5–15.5)
WBC: 13 10*3/uL — ABNORMAL HIGH (ref 4.0–10.5)
nRBC: 0 % (ref 0.0–0.2)

## 2022-01-16 LAB — BASIC METABOLIC PANEL
Anion gap: 13 (ref 5–15)
BUN: 92 mg/dL — ABNORMAL HIGH (ref 8–23)
CO2: 17 mmol/L — ABNORMAL LOW (ref 22–32)
Calcium: 8 mg/dL — ABNORMAL LOW (ref 8.9–10.3)
Chloride: 102 mmol/L (ref 98–111)
Creatinine, Ser: 6.44 mg/dL — ABNORMAL HIGH (ref 0.44–1.00)
GFR, Estimated: 7 mL/min — ABNORMAL LOW (ref >60)
Glucose, Bld: 93 mg/dL (ref 70–99)
Potassium: 4.5 mmol/L (ref 3.5–5.1)
Sodium: 132 mmol/L — ABNORMAL LOW (ref 135–145)

## 2022-01-16 LAB — URINALYSIS, COMPLETE (UACMP) WITH MICROSCOPIC
Bilirubin Urine: NEGATIVE
Glucose, UA: NEGATIVE mg/dL
Ketones, ur: NEGATIVE mg/dL
Leukocytes,Ua: NEGATIVE
Nitrite: NEGATIVE
Protein, ur: NEGATIVE mg/dL
Specific Gravity, Urine: 1.014 (ref 1.005–1.030)
pH: 5 (ref 5.0–8.0)

## 2022-01-16 LAB — HEMOGLOBIN A1C
Hgb A1c MFr Bld: 6.6 % — ABNORMAL HIGH (ref 4.8–5.6)
Mean Plasma Glucose: 142.72 mg/dL

## 2022-01-16 LAB — IRON AND TIBC
Iron: 25 ug/dL — ABNORMAL LOW (ref 28–170)
Saturation Ratios: 9 % — ABNORMAL LOW (ref 10.4–31.8)
TIBC: 291 ug/dL (ref 250–450)
UIBC: 266 ug/dL

## 2022-01-16 LAB — CBG MONITORING, ED
Glucose-Capillary: 85 mg/dL (ref 70–99)
Glucose-Capillary: 113 mg/dL — ABNORMAL HIGH (ref 70–99)
Glucose-Capillary: 126 mg/dL — ABNORMAL HIGH (ref 70–99)

## 2022-01-16 LAB — HIV ANTIBODY (ROUTINE TESTING W REFLEX): HIV Screen 4th Generation wRfx: NONREACTIVE

## 2022-01-16 LAB — GLUCOSE, CAPILLARY: Glucose-Capillary: 159 mg/dL — ABNORMAL HIGH (ref 70–99)

## 2022-01-16 LAB — FERRITIN: Ferritin: 64 ng/mL (ref 11–307)

## 2022-01-16 LAB — LACTIC ACID, PLASMA: Lactic Acid, Venous: 1.8 mmol/L (ref 0.5–1.9)

## 2022-01-16 LAB — SARS CORONAVIRUS 2 BY RT PCR: SARS Coronavirus 2 by RT PCR: NEGATIVE

## 2022-01-16 MED ORDER — SODIUM CHLORIDE 0.9 % IV BOLUS
500.0000 mL | Freq: Once | INTRAVENOUS | Status: AC
  Administered 2022-01-16: 500 mL via INTRAVENOUS

## 2022-01-16 MED ORDER — PREGABALIN 75 MG PO CAPS
75.0000 mg | ORAL_CAPSULE | Freq: Every day | ORAL | Status: DC
  Administered 2022-01-16 – 2022-01-18 (×3): 75 mg via ORAL
  Filled 2022-01-16: qty 3
  Filled 2022-01-16 (×2): qty 1
  Filled 2022-01-16: qty 3

## 2022-01-16 MED ORDER — PREGABALIN 100 MG PO CAPS: 200.0000 mg | ORAL_CAPSULE | Freq: Two times a day (BID) | ORAL | Status: DC

## 2022-01-16 MED ORDER — SODIUM BICARBONATE 8.4 % IV SOLN
50.0000 meq | Freq: Once | INTRAVENOUS | Status: AC
  Administered 2022-01-16: 50 meq via INTRAVENOUS
  Filled 2022-01-16: qty 50

## 2022-01-16 MED ORDER — SODIUM CHLORIDE 0.9 % IV SOLN
1.0000 g | INTRAVENOUS | Status: DC
  Administered 2022-01-16 – 2022-01-17 (×2): 1 g via INTRAVENOUS
  Filled 2022-01-16 (×2): qty 10

## 2022-01-16 MED ORDER — AZITHROMYCIN 500 MG IV SOLR
500.0000 mg | INTRAVENOUS | Status: DC
  Administered 2022-01-17: 500 mg via INTRAVENOUS
  Filled 2022-01-16 (×2): qty 5

## 2022-01-16 MED ORDER — MELATONIN 3 MG PO TABS
3.0000 mg | ORAL_TABLET | Freq: Once | ORAL | Status: AC
  Administered 2022-01-17: 3 mg via ORAL
  Filled 2022-01-16: qty 1

## 2022-01-16 MED ORDER — INSULIN ASPART 100 UNIT/ML IJ SOLN
0.0000 [IU] | Freq: Three times a day (TID) | INTRAMUSCULAR | Status: DC
  Administered 2022-01-17 (×2): 1 [IU] via SUBCUTANEOUS
  Administered 2022-01-18: 2 [IU] via SUBCUTANEOUS

## 2022-01-16 MED ORDER — INSULIN GLARGINE-YFGN 100 UNIT/ML ~~LOC~~ SOLN: 12.0000 [IU] | Freq: Every day | SUBCUTANEOUS | Status: DC

## 2022-01-16 MED ORDER — ASPIRIN 325 MG PO TBEC
325.0000 mg | DELAYED_RELEASE_TABLET | Freq: Every day | ORAL | Status: DC
  Administered 2022-01-16 – 2022-01-18 (×3): 325 mg via ORAL
  Filled 2022-01-16 (×3): qty 1

## 2022-01-16 MED ORDER — INSULIN ASPART 100 UNIT/ML IJ SOLN: 0.0000 [IU] | Freq: Every day | INTRAMUSCULAR | Status: DC

## 2022-01-16 MED ORDER — ROSUVASTATIN CALCIUM 5 MG PO TABS
10.0000 mg | ORAL_TABLET | Freq: Every day | ORAL | Status: DC
  Administered 2022-01-16 – 2022-01-17 (×2): 10 mg via ORAL
  Filled 2022-01-16 (×2): qty 2

## 2022-01-16 NOTE — Progress Notes (Signed)
       CROSS COVER NOTE  NAME: Brittany Carroll MRN: [id number] DOB : [birth year]    Date of Service   01/16/2022   HPI/Events of Note   Medication request received for sleep aid.  Interventions   Plan: Melatonin x1     This document was prepared using Dragon voice recognition software and may include unintentional dictation errors.  Bishop Limbo DNP, MHA, FNP-BC Nurse Practitioner [facility] Pager [phone]

## 2022-01-16 NOTE — ED Notes (Signed)
Breakfast order placed ?

## 2022-01-16 NOTE — ED Notes (Signed)
RN cleaned up pt, New sheets applied.

## 2022-01-16 NOTE — ED Notes (Signed)
MD notified of CBG and that pt has had no output since being here

## 2022-01-16 NOTE — Progress Notes (Signed)
PROGRESS NOTE    Brittany Carroll  RFF:[id number] DOB: 1956-04-08 DOA: 01/15/2022 PCP: Ellyn Hack, MD   Brief Narrative:  This 66 years old female with PMH significant for HTN, DM 2, fibromyalgia, tobacco abuse, cervical fusion, history of prior A-fib presented in the ED with complaints of altered mentation and diarrhea.  Family reports that she has had gradual decline in mental status over the past 3 weeks.  She went to her PCP and found to have a blood pressure of 80/50 with altered mentation and was sent in the ED.  Labs consistent with acute kidney injury with serum creatinine up to 8.0.  Nephrology is consulted.  No acute indication for hemodialysis at this time.  Assessment & Plan:   Principal Problem:   Acute metabolic encephalopathy Active Problems:   Hypotension   Sepsis (HCC)   Acute kidney failure (HCC)   Resistant hypertension   Chronic prescription opiate use   Hyperthyroidism   DM2 (diabetes mellitus, type 2) (HCC)   Abnormal liver CT  Acute metabolic encephalopathy: Could be multifactorial, likely uremic encephalopathy with BUN of 117 and creatinine 8.30.   Baseline serum creatinine normal in May. Unintentional opioid overdose (taking normal dose of chronic prescription opioids in the setting of acute kidney injury) CT head without any abnormality.  Ammonia level normal. No hypercapnia or acidosis on VBG. Continue IV hydration, Nephrology is consulted. No Acute indication for hemodialysis at this time. Mental status has significantly improved,  She is back to her baseline.  Acute kidney injury:  Likely multifactorial, Prerenal and renal, hypotension in the setting of diarrhea, and continuation of home medications HCTZ and losartan. Serum creatinine 0.78 in May, presented with serum creatinine of 8.30,  Renal ultrasound No hydronephrosis, No abnormality.  Continue IV hydration. Avoid nephrotoxic medications, Serum creatinine is improving  8.30>7.03>6.44 Nephrology is consulted.  If her mental status does not improve may consider hemodialysis.  Consent is obtained from the daughter.  Sepsis secondary to possible pneumonia: Presented with tachycardia, tachypnea, hypotension, lactic acidosis, leukocytosis. CT abdomen pelvis shows heterogeneous opacities in the right lung. Procalcitonin 1.87, lactic acid 2.2, WBC 13.0 Initiated on empiric antibiotics(ceftriaxone and Zithromax) Trend lactic acid and procalcitonin.  Essential hypertension: Hold blood pressure medication since blood pressure is on the lower side.  Type 2 diabetes: Regular insulin sliding scale,  avoid oral diabetic medications  Microcytic hypochromic anemia: Could be likely from hemodilution.   Monitor iron stores.  Hyperthyroidism: Continue Tapazole.  Chronic pain syndrome: Hold sedatives in the setting of AKI and AMS.   DVT prophylaxis:SCDs Code Status: Full code. Family Communication: No family at bed side Disposition Plan:   Status is: Inpatient Remains inpatient appropriate because: Admitted for altered mental status and diarrhea leading to acute kidney injury, requiring IV hydration and nephrology evaluation.  Anticipated discharge in few days once renal functions improved.   Consultants:  Nephrology  Procedures: CT abdomen pelvis, CT head, renal ultrasound Antimicrobials: Ceftriaxone and Zithromax  Subjective: Patient was seen and examined at bedside.  Overnight events noted. Patient seems back to her baseline mental status.   She is fully awake,  alert and oriented x 3 following full commands.  Objective: Vitals:   01/16/22 0430 01/16/22 0805 01/16/22 0845 01/16/22 0847  BP: 100/69  109/67 109/67  Pulse: 93   87  Resp: 10  12 14   Temp:  98.3 F (36.8 C)    TempSrc:  Oral    SpO2: 96%   96%  Weight:  Height:       No intake or output data in the 24 hours ending 01/16/22 1115 Filed Weights   01/15/22 1727  Weight:  104.3 kg    Examination:  General exam: Appears comfortable, not in any acute distress. Respiratory system: CTA bilaterally, no wheezing, no crackles, normal respiratory effort. Cardiovascular system: S1 & S2 heard, regular rate and rhythm, no murmur. Gastrointestinal system: Abdomen is soft, nontender, nondistended, BS+ Central nervous system: Alert and oriented x 3. No focal neurological deficits. Extremities: No edema, no cyanosis, no clubbing. Skin: No rashes, lesions or ulcers Psychiatry: Judgement and insight appear normal. Mood & affect appropriate.     Data Reviewed: I have personally reviewed following labs and imaging studies  CBC: Recent Labs  Lab 01/15/22 1720 01/15/22 2019 01/16/22 0254  WBC 15.3*  --  13.0*  NEUTROABS 10.4*  --   --   HGB 10.6* 10.5*  10.9* 10.0*  HCT 32.3* 31.0*  32.0* 31.2*  MCV 76.4*  --  78.2*  PLT 322  --  99991111   Basic Metabolic Panel: Recent Labs  Lab 01/15/22 1720 01/15/22 2019 01/15/22 2204 01/16/22 0254  NA 131* 130*  129* 130* 132*  K 5.2* 5.0  5.0 5.3* 4.5  CL 94* 97* 97* 102  CO2 18*  --  17* 17*  GLUCOSE 103* 112* 110* 93  BUN 96* 113* 95* 92*  CREATININE 7.81* 8.30* 7.08* 6.44*  CALCIUM 8.7*  --  8.2* 8.0*  MG 1.6*  --   --   --    GFR: Estimated Creatinine Clearance: 10.7 mL/min (A) (by C-G formula based on SCr of 6.44 mg/dL (H)). Liver Function Tests: Recent Labs  Lab 01/15/22 1720  AST 146*  ALT 56*  ALKPHOS 84  BILITOT 0.5  PROT 6.9  ALBUMIN 3.3*   No results for input(s): "LIPASE", "AMYLASE" in the last 168 hours. Recent Labs  Lab 01/15/22 1720  AMMONIA 39*   Coagulation Profile: Recent Labs  Lab 01/15/22 1720  INR 1.2   Cardiac Enzymes: No results for input(s): "CKTOTAL", "CKMB", "CKMBINDEX", "TROPONINI" in the last 168 hours. BNP (last 3 results) No results for input(s): "PROBNP" in the last 8760 hours. HbA1C: No results for input(s): "HGBA1C" in the last 72 hours. CBG: Recent Labs   Lab 01/15/22 1940 01/16/22 0137  GLUCAP 115* 85   Lipid Profile: No results for input(s): "CHOL", "HDL", "LDLCALC", "TRIG", "CHOLHDL", "LDLDIRECT" in the last 72 hours. Thyroid Function Tests: Recent Labs    01/15/22 1720 01/15/22 2204  TSH 0.299*  --   FREET4  --  0.78   Anemia Panel: Recent Labs    01/15/22 2204  FERRITIN 64  TIBC 291  IRON 25*   Sepsis Labs: Recent Labs  Lab 01/15/22 1712 01/15/22 2204  PROCALCITON  --  1.85  LATICACIDVEN 2.2*  --     Recent Results (from the past 240 hour(s))  SARS Coronavirus 2 by RT PCR (hospital order, performed in [facility] hospital lab) *cepheid single result test* Anterior Nasal Swab     Status: None   Collection Time: 01/15/22  9:23 PM   Specimen: Anterior Nasal Swab  Result Value Ref Range Status   SARS Coronavirus 2 by RT PCR NEGATIVE NEGATIVE Final    Comment: (NOTE) SARS-CoV-2 target nucleic acids are NOT DETECTED.  The SARS-CoV-2 RNA is generally detectable in upper and lower respiratory specimens during the acute phase of infection. The lowest concentration of SARS-CoV-2 viral copies this assay can  detect is 250 copies / mL. A negative result does not preclude SARS-CoV-2 infection and should not be used as the sole basis for treatment or other patient management decisions.  A negative result may occur with improper specimen collection / handling, submission of specimen other than nasopharyngeal swab, presence of viral mutation(s) within the areas targeted by this assay, and inadequate number of viral copies (<250 copies / mL). A negative result must be combined with clinical observations, patient history, and epidemiological information.  Fact Sheet for Patients:   RoadLapTop.co.za  Fact Sheet for Healthcare Providers: http://kim-miller.com/  This test is not yet approved or  cleared by the [place] FDA and has been authorized for detection and/or  diagnosis of SARS-CoV-2 by FDA under an Emergency Use Authorization (EUA).  This EUA will remain in effect (meaning this test can be used) for the duration of the COVID-19 declaration under Section 564(b)(1) of the Act, 21 U.S.C. section 360bbb-3(b)(1), unless the authorization is terminated or revoked sooner.  Performed at [facility], [address] [place], [place] [address]    Radiology Studies: US RENAL  Result Date: 01/16/2022 CLINICAL DATA:  66 year old female history of acute kidney injury. EXAM: RENAL / URINARY TRACT ULTRASOUND COMPLETE COMPARISON:  No priors. FINDINGS: Right Kidney: Renal measurements: 12.5 x 4.9 x 5.8 cm = volume: 186.7 mL. In the interpolar region of the right kidney there is a 3.4 x 1.9 x 2.5 cm generally anechoic lesion which appears to have at least 1 thin internal septation, compatible with a minimally complex (Bosniak class 2) cyst. Echogenicity within normal limits. No hydronephrosis visualized. Left Kidney: Renal measurements: 10.8 x 5.6 x 6.2 cm = volume: 198.2 mL. There are 2 lesions in the left kidney measuring 1.3 x 1.5 x 1.2 cm and 1.8 x 1.6 x 1.7 cm which are both anechoic with increased through transmission, compatible with small simple cysts (Bosniak class 1). Echogenicity within normal limits. No hydronephrosis visualized. Bladder: Appears normal for degree of bladder distention. Other: None. IMPRESSION: 1. No acute findings.  Specifically, no hydronephrosis. 2. Small Bosniak class 1 and Bosniak class 2 cysts in the kidneys, as above. No imaging follow-up is recommended. Electronically Signed   By: Trudie Reed M.D.   On: 01/16/2022 07:18   CT ABDOMEN PELVIS WO CONTRAST  Result Date: 01/15/2022 CLINICAL DATA:  Hypotension and diarrhea EXAM: CT ABDOMEN AND PELVIS WITHOUT CONTRAST TECHNIQUE: Multidetector CT imaging of the abdomen and pelvis was performed following the standard protocol without IV contrast. RADIATION DOSE REDUCTION: This exam  was performed according to the departmental dose-optimization program which includes automated exposure control, adjustment of the mA and/or kV according to patient size and/or use of iterative reconstruction technique. COMPARISON:  CT 07/14/2021 FINDINGS: Lower chest: Lung bases demonstrate patchy consolidation at the left greater than right lung base. No pleural effusion. Normal cardiac size. Hepatobiliary: Suspicion of subtle contour nodularity of the liver. Status post cholecystectomy. No biliary dilatation Pancreas: Unremarkable. No pancreatic ductal dilatation or surrounding inflammatory changes. Spleen: Normal in size without focal abnormality. Adrenals/Urinary Tract: Adrenal glands are normal. Kidneys show no hydronephrosis. Low-density lesions in the left kidney cannot be further characterized without contrast. The bladder is unremarkable Stomach/Bowel: The stomach is nonenlarged. Mild fluid distension of the duodenum. Fluid-filled nondistended small bowel in the pelvis without well-defined transition. Small bowel is otherwise decompressed. No convincing bowel wall thickening. Negative appendix. Vascular/Lymphatic: Mild aortic atherosclerosis. No aneurysm. No suspicious lymph nodes. Reproductive: Status post hysterectomy. No adnexal  masses. Other: Negative for pelvic effusion or free air. Musculoskeletal: Posterior lumbar fusion hardware L2 through the sacrum with lucency about the L2 and L3 fixating screws. Fixating screws at L2 and L3 extend to the superior endplate on the left. No acute osseous abnormality is seen. IMPRESSION: 1. Heterogeneous consolidations in the left greater than right lung base which may be due to atelectasis, pneumonia or aspiration. 2. Suspect subtle contour nodularity of the liver as may be seen with cirrhosis. Correlate for risk factors 3. Scattered fluid-filled nondilated small and large bowel without definitive point of obstruction. Findings could be secondary to mild ileus or  enteritis. No acute bowel wall thickening is seen. Electronically Signed   By: Donavan Foil M.D.   On: 01/15/2022 22:53   CT Head Wo Contrast  Result Date: 01/15/2022 CLINICAL DATA:  Altered mental status EXAM: CT HEAD WITHOUT CONTRAST TECHNIQUE: Contiguous axial images were obtained from the base of the skull through the vertex without intravenous contrast. RADIATION DOSE REDUCTION: This exam was performed according to the departmental dose-optimization program which includes automated exposure control, adjustment of the mA and/or kV according to patient size and/or use of iterative reconstruction technique. COMPARISON:  None Available. FINDINGS: Brain: No acute intracranial findings on saline noncontrast CT brain. Ventricles are not dilated. Cortical sulci are prominent suggesting atrophy. There is no focal edema or mass effect. Vascular: Unremarkable. Skull: Unremarkable. Sinuses/Orbits: Old blowout fracture is seen in the medial wall of left orbit. There is mucosal thickening in right maxillary sinus. Other: None. IMPRESSION: No acute intracranial findings are seen in noncontrast CT brain. Other findings as described in the body of the report. Electronically Signed   By: Elmer Picker M.D.   On: 01/15/2022 18:18   DG Chest 1 View  Result Date: 01/15/2022 CLINICAL DATA:  Altered mental status.  Hypotension. EXAM: CHEST  1 VIEW COMPARISON:  Radiograph 02/24/2020, chest CT 04/25/2020 FINDINGS: Heart size upper normal. Stable mediastinal contours with aortic atherosclerosis. There are ill-defined opacities in both lung bases and left midlung zone. Background peribronchial thickening likely related to emphysema. No significant pleural effusion or pneumothorax. Surgical hardware in the cervical spine partially included. IMPRESSION: Ill-defined opacities in both lung bases and left midlung zone, may represent atelectasis versus multifocal infection. Electronically Signed   By: Keith Rake M.D.   On:  01/15/2022 17:32    Scheduled Meds:  heparin  5,000 Units Subcutaneous Q8H   methimazole  10 mg Oral Daily   Continuous Infusions:  sodium chloride 75 mL/hr at 01/16/22 0806   azithromycin     cefTRIAXone (ROCEPHIN)  IV       LOS: 1 day    Time spent: 50 mins    Mercades Bajaj, MD [facility]   If 7PM-7AM, please contact night-coverage

## 2022-01-16 NOTE — Plan of Care (Signed)

## 2022-01-16 NOTE — Consult Note (Signed)
[facility] Renal Consultation Note  Requesting MD: Lyda Perone, DO Indication for Consultation:  AKI   Chief complaint: altered mental status   HPI:  Brittany Carroll is a 66 y.o. female with a history DM, HTN, fibromyalgia, tobacco abuse, cervical fusion, and hx prior afib who presented to the hospital with altered mental status and diarrhea.  She had hypotension on arrival and was given fluid boluses with LR per the ER.  Per report she had stopped her blood pressure medications prior to arrival (these include HCTZ and losartan as below).  Reviewed meds at time of consult and they were to shortly assess her to ensure that her clonidine patch was off.  She was found to have AKI with Cr trends below.  Baseline Cr less than 1 and 7.81 on presentation here; she was last at her baseline in May 2023.  Her bladder scan was 70mL per time of consult.  Note entry from RN that she has not had urine output since arrival.  Requested a CT abd/pelvis without contrast and requested transition to NS at 75 ml/hr.  She has had some slight interval improvement in her creatinine as below.  Note renal angiography in May of this year with Dr. Jacinto Halim due to hemodynamically significant right renal artery stenosis per a 07/2021 duplex; the May angiography demonstrated no renal artery stenosis and false positive was suspected due to tortuosity.  After that time she was started on a clonidine patch for HTN.  Note per report that her thyroid med (methimazole) was recently titrated per outpatient labs.  CXR with possible multifocal PNA vs. atelectasis.  CT head with no acute intracranial process.  Covid negative.  Her CT abd/pelvis demonstrates no hydro, subtle contour nodularity of the liver suggestive of possible cirrhosis, and enteritis or ileus, and low density lesions in left kidney.    She falls asleep multiple times during our interview.  Does confirm having diarrhea.  States not on NSAID's anymore as "they don't  work for me".  She has a daughter and is ok if I call her.  Patient states that she is not wearing her clonidine patch.  Called her daughter via phone and she states that her mother hasn't worn the clonidine patch in a while and hasn't consistently been taking medications for the past couple of weeks.  She did definitely have meds the morning of presentation because patient's granddaughter went over to ensure that she was taking her meds.  Her daughter states that her mother would want dialysis if it were needed.  We discussed risks/benefits/indications for dialysis and she did consent to dialysis for her mother if it becomes indicated.   Creatinine, Ser  Date/Time Value Ref Range Status  01/16/2022 02:54 AM 6.44 (H) 0.44 - 1.00 mg/dL Final  09/60/4540 98:11 PM 7.08 (H) 0.44 - 1.00 mg/dL Final  91/47/8295 62:13 PM 8.30 (H) 0.44 - 1.00 mg/dL Final  08/65/7846 96:29 PM 7.81 (H) 0.44 - 1.00 mg/dL Final  52/84/1324 40:10 PM 0.78 0.57 - 1.00 mg/dL Final  27/25/3664 40:34 PM 0.95 0.57 - 1.00 mg/dL Final  74/25/9563 87:56 PM 0.88 0.57 - 1.00 mg/dL Final     PMHx:   Past Medical History:  Diagnosis Date   Cataract    OU   Diabetes mellitus without complication (HCC)    Fibromyalgia    History of atrial fibrillation without current medication    2008 in [place] had cardioversion   Hyperlipidemia    Hypertension    Macular  degeneration     Past Surgical History:  Procedure Laterality Date   CERVICAL FUSION     ECTOPIC PREGNANCY SURGERY     LUMBAR FUSION     PARTIAL HYSTERECTOMY     RENAL ANGIOGRAPHY N/A 10/03/2021   Procedure: RENAL ANGIOGRAPHY;  Surgeon: Yates Decamp, MD;  Location: [facility];  Service: Cardiovascular;  Laterality: N/A;    Family Hx:  Family History  Problem Relation Age of Onset   Atrial fibrillation Mother    Hypertension Mother    Hyperlipidemia Mother    Leukemia Father    Diabetes Brother    Hypertension Brother    Hyperlipidemia Brother    Diabetes  Brother    Hyperlipidemia Brother    Hypertension Brother   Denies any family history of kidney failure   Social History:  reports that she has been smoking cigarettes. She has a 17.50 pack-year smoking history. She has never used smokeless tobacco. She reports that she does not currently use alcohol. She reports that she does not currently use drugs after having used the following drugs: Heroin, Cocaine, and Marijuana.  Allergies:  Allergies  Allergen Reactions   Lisinopril Hives   Metformin And Related Hives    Medications: Prior to Admission medications   Medication Sig Start Date End Date Taking? Authorizing Provider  amitriptyline (ELAVIL) 75 MG tablet Take 75 mg by mouth at bedtime. 11/15/20   [provider]  Ascorbic Acid (VITAMIN C) 1000 MG tablet Take 1,000 mg by mouth daily.    [provider]  buPROPion (WELLBUTRIN XL) 150 MG 24 hr tablet Take 150 mg by mouth daily.    [provider]  cholecalciferol (VITAMIN D3) 25 MCG (1000 UT) tablet Take 1,000 Units by mouth daily.    [provider]  cloNIDine (CATAPRES - DOSED IN MG/24 HR) 0.2 mg/24hr patch Place 1 patch (0.2 mg total) onto the skin every 7 (seven) days. 10/03/21 10/03/22  Yates Decamp, MD  cloNIDine (CATAPRES - DOSED IN MG/24 HR) 0.2 mg/24hr patch 0.2 mg once a week. 10/04/21   [provider]  esomeprazole (NEXIUM) 40 MG capsule Take 40 mg by mouth daily as needed (acid reflux).    [provider]  gabapentin (NEURONTIN) 800 MG tablet TAKE 1 TABLET(S) THREE TIMES A DAY BY ORAL ROUTE FOR 30 DAYS. 03/03/19   [provider]  HUMALOG KWIKPEN 200 UNIT/ML KwikPen Inject 5 Units into the skin 3 (three) times daily with meals. 06/29/21   [provider]  hydrALAZINE (APRESOLINE) 50 MG tablet Take 1 tablet (50 mg total) by mouth 3 (three) times daily. 06/19/21 06/14/22  Cantwell, Celeste C, PA-C  hydrochlorothiazide (HYDRODIURIL) 25 MG tablet Take 1 tablet (25 mg  total) by mouth daily. 03/15/21 03/10/22  Cantwell, Celeste C, PA-C  insulin glargine (LANTUS SOLOSTAR) 100 UNIT/ML Solostar Pen Inject 12 Units into the skin at bedtime.    [provider]  JENTADUETO 2.09-998 MG TABS Take 1 tablet by mouth 2 (two) times daily. 11/09/20   [provider]  losartan (COZAAR) 100 MG tablet Take 1 tablet (100 mg total) by mouth daily. 03/22/21   Yates Decamp, MD  magnesium oxide (MAG-OX) 400 MG tablet Take 400 mg by mouth 2 (two) times daily.    [provider]  methimazole (TAPAZOLE) 10 MG tablet Take 10 mg by mouth daily.    [provider]  methocarbamol (ROBAXIN) 500 MG tablet Take 500 mg by mouth 2 (two) times daily as  needed for muscle spasms. 05/30/21   [provider]  metoprolol succinate (TOPROL-XL) 50 MG 24 hr tablet TAKE 1 TABLET (50 MG TOTAL) BY MOUTH EVERY EVENING. TAKE WITH OR IMMEDIATELY FOLLOWING A MEAL. 04/03/21 01/15/22  Yates Decamp, MD  morphine (MS CONTIN) 15 MG 12 hr tablet Take 15 mg by mouth 2 (two) times daily.    [provider]  Multiple Vitamin (MULTIVITAMIN WITH MINERALS) TABS tablet Take 1 tablet by mouth daily.    [provider]  Oxycodone HCl 20 MG TABS Take 20 mg by mouth 4 (four) times daily. 05/30/19   [provider]  pregabalin (LYRICA) 200 MG capsule Take 200 mg by mouth 2 (two) times daily. 01/05/21   [provider]  rosuvastatin (CRESTOR) 10 MG tablet Take 1 tablet by mouth at bedtime.    [provider]    I have reviewed the patient's current and reported prior to admission medications.  Labs:     Latest Ref Rng & Units 01/16/2022    2:54 AM 01/15/2022   10:04 PM 01/15/2022    8:19 PM  BMP  Glucose 70 - 99 mg/dL 93  998  338   BUN 8 - 23 mg/dL 92  95  250   Creatinine 0.44 - 1.00 mg/dL 5.39  7.67  3.41   Sodium 135 - 145 mmol/L 132  130  129    130   Potassium 3.5 - 5.1 mmol/L 4.5  5.3  5.0    5.0   Chloride 98 - 111 mmol/L 102  97  97    CO2 22 - 32 mmol/L 17  17    Calcium 8.9 - 10.3 mg/dL 8.0  8.2      Urinalysis No results found for: "COLORURINE", "APPEARANCEUR", "LABSPEC", "PHURINE", "GLUCOSEU", "HGBUR", "BILIRUBINUR", "KETONESUR", "PROTEINUR", "UROBILINOGEN", "NITRITE", "LEUKOCYTESUR"   ROS: Unable to obtain secondary to altered mental status  Physical Exam: Vitals:   01/16/22 0415 01/16/22 0430  BP: 100/65 100/69  Pulse: 92 93  Resp: 10 10  Temp:    SpO2: 96% 96%     General: adult female in bed in NAD  HEENT: NCAT Eyes: EOMI sclera anicteric Neck: supple trachea midline  Heart: S1S2 no rub Lungs: clear to auscultation and unlabored; on 2 liters oxygen  Abdomen: soft/nontender/nondistended; obese habitus Extremities: no edema appreciated; no cyanosis or clubbing Skin: no rash on extremities exposed; neither myself nor bedside RN can locate a clonidine patch Neuro: falls asleep during interview multiple times; provides her name and location of hospital; will intermittently answer specific questions; does not provide year   Assessment/Plan:  # AKI  - Secondary to ischemic and pre-renal insults.  Setting of diarrhea, hypotension in setting of home meds HCTZ and losartan.   - No acute indication for dialysis however if mental status does not improve and if renal improvement plateaus may need to consider dialysis.  Her daughter did consent to HD if needed - Continue normal saline - adjust rate from 125 ml/hr to 75 ml/hr - normal saline 500 mL once now   - Check UA and urine protein/cr ratio - hold HCTZ and losartan - Changed to renal diabetic diet   - Per RN earlier bladder scan with 15 mL and a more recent repeat bladder scan was acceptable   # Altered mental status - secondary to AKI as well as medications  - would hold sedating medications such as home gabapentin and home MS contin.  Would need to significantly reduce  gabapentin dose should this be resumed at a later date  # HTN  - hx of such  with hypotension here - Continue fluids as above and hold home losartan and HCTZ   # Metabolic acidosis  - mild overall and setting of AKI.  Note home metformin use.  Lactic acid mildly elevated at 2.2 - s/p bicarbonate 1 amp really to temporize K  - defer bicarb gtt for now - consider PO bicarb if she wakes up and is better able to take PO medications   # Microcytic anemia - may worsen further with hemodilution from fluids  - Check iron stores  # DM type 2 - Per primary team  - Given her AKI would avoid any metformin containing products (note jentadueto listed as a home med)   # Left renal lesions  - Would obtain renal US to see if this offers more clarity  Would recommend inpatient monitoring   Estanislado Emms 01/16/2022, 6:40 AM

## 2022-01-17 ENCOUNTER — Inpatient Hospital Stay (HOSPITAL_COMMUNITY): Payer: Medicare Other

## 2022-01-17 DIAGNOSIS — N178 Other acute kidney failure: Secondary | ICD-10-CM

## 2022-01-17 DIAGNOSIS — G9341 Metabolic encephalopathy: Secondary | ICD-10-CM | POA: Diagnosis not present

## 2022-01-17 DIAGNOSIS — R932 Abnormal findings on diagnostic imaging of liver and biliary tract: Secondary | ICD-10-CM | POA: Diagnosis not present

## 2022-01-17 DIAGNOSIS — N179 Acute kidney failure, unspecified: Secondary | ICD-10-CM | POA: Diagnosis not present

## 2022-01-17 LAB — BASIC METABOLIC PANEL
Anion gap: 8 (ref 5–15)
BUN: 56 mg/dL — ABNORMAL HIGH (ref 8–23)
CO2: 21 mmol/L — ABNORMAL LOW (ref 22–32)
Calcium: 8.5 mg/dL — ABNORMAL LOW (ref 8.9–10.3)
Chloride: 108 mmol/L (ref 98–111)
Creatinine, Ser: 1.78 mg/dL — ABNORMAL HIGH (ref 0.44–1.00)
GFR, Estimated: 31 mL/min — ABNORMAL LOW (ref >60)
Glucose, Bld: 113 mg/dL — ABNORMAL HIGH (ref 70–99)
Potassium: 3.5 mmol/L (ref 3.5–5.1)
Sodium: 137 mmol/L (ref 135–145)

## 2022-01-17 LAB — CBC
HCT: 29.5 % — ABNORMAL LOW (ref 36.0–46.0)
HCT: 28.5 % — ABNORMAL LOW (ref 36.0–46.0)
Hemoglobin: 10 g/dL — ABNORMAL LOW (ref 12.0–15.0)
Hemoglobin: 9.7 g/dL — ABNORMAL LOW (ref 12.0–15.0)
MCH: 25.1 pg — ABNORMAL LOW (ref 26.0–34.0)
MCH: 25.4 pg — ABNORMAL LOW (ref 26.0–34.0)
MCHC: 33.9 g/dL (ref 30.0–36.0)
MCHC: 34 g/dL (ref 30.0–36.0)
MCV: 74.1 fL — ABNORMAL LOW (ref 80.0–100.0)
MCV: 74.6 fL — ABNORMAL LOW (ref 80.0–100.0)
Platelets: 242 10*3/uL (ref 150–400)
Platelets: 244 10*3/uL (ref 150–400)
RBC: 3.98 MIL/uL (ref 3.87–5.11)
RBC: 3.82 MIL/uL — ABNORMAL LOW (ref 3.87–5.11)
RDW: 15.6 % — ABNORMAL HIGH (ref 11.5–15.5)
RDW: 15.7 % — ABNORMAL HIGH (ref 11.5–15.5)
WBC: 7.7 10*3/uL (ref 4.0–10.5)
WBC: 8.2 10*3/uL (ref 4.0–10.5)
nRBC: 0 % (ref 0.0–0.2)
nRBC: 0 % (ref 0.0–0.2)

## 2022-01-17 LAB — RENAL FUNCTION PANEL
Albumin: 2.9 g/dL — ABNORMAL LOW (ref 3.5–5.0)
Anion gap: 9 (ref 5–15)
BUN: 55 mg/dL — ABNORMAL HIGH (ref 8–23)
CO2: 20 mmol/L — ABNORMAL LOW (ref 22–32)
Calcium: 8.5 mg/dL — ABNORMAL LOW (ref 8.9–10.3)
Chloride: 108 mmol/L (ref 98–111)
Creatinine, Ser: 1.73 mg/dL — ABNORMAL HIGH (ref 0.44–1.00)
GFR, Estimated: 32 mL/min — ABNORMAL LOW (ref >60)
Glucose, Bld: 113 mg/dL — ABNORMAL HIGH (ref 70–99)
Phosphorus: 2.4 mg/dL — ABNORMAL LOW (ref 2.5–4.6)
Potassium: 3.6 mmol/L (ref 3.5–5.1)
Sodium: 137 mmol/L (ref 135–145)

## 2022-01-17 LAB — GLUCOSE, CAPILLARY
Glucose-Capillary: 150 mg/dL — ABNORMAL HIGH (ref 70–99)
Glucose-Capillary: 123 mg/dL — ABNORMAL HIGH (ref 70–99)
Glucose-Capillary: 113 mg/dL — ABNORMAL HIGH (ref 70–99)
Glucose-Capillary: 149 mg/dL — ABNORMAL HIGH (ref 70–99)

## 2022-01-17 LAB — PHOSPHORUS: Phosphorus: 2.4 mg/dL — ABNORMAL LOW (ref 2.5–4.6)

## 2022-01-17 LAB — MAGNESIUM: Magnesium: 1.1 mg/dL — ABNORMAL LOW (ref 1.7–2.4)

## 2022-01-17 MED ORDER — AZITHROMYCIN 500 MG PO TABS
500.0000 mg | ORAL_TABLET | Freq: Every day | ORAL | Status: DC
  Administered 2022-01-17: 500 mg via ORAL
  Filled 2022-01-17: qty 1

## 2022-01-17 MED ORDER — METOPROLOL SUCCINATE ER 50 MG PO TB24
50.0000 mg | ORAL_TABLET | Freq: Every day | ORAL | Status: DC
Start: 2022-01-17 — End: 2022-01-18
  Administered 2022-01-17 – 2022-01-18 (×2): 50 mg via ORAL
  Filled 2022-01-17 (×2): qty 1

## 2022-01-17 MED ORDER — K PHOS MONO-SOD PHOS DI & MONO 155-852-130 MG PO TABS
500.0000 mg | ORAL_TABLET | Freq: Two times a day (BID) | ORAL | Status: DC
  Administered 2022-01-17 – 2022-01-18 (×2): 500 mg via ORAL
  Filled 2022-01-17 (×2): qty 2

## 2022-01-17 MED ORDER — MAGNESIUM SULFATE 50 % IJ SOLN
4.0000 g | Freq: Once | INTRAVENOUS | Status: AC
  Administered 2022-01-17: 4 g via INTRAVENOUS
  Filled 2022-01-17: qty 8

## 2022-01-17 MED ORDER — OXYCODONE HCL 5 MG PO TABS
5.0000 mg | ORAL_TABLET | Freq: Four times a day (QID) | ORAL | Status: DC | PRN
Start: 2022-01-17 — End: 2022-01-17

## 2022-01-17 MED ORDER — OXYCODONE HCL 5 MG PO TABS
5.0000 mg | ORAL_TABLET | Freq: Four times a day (QID) | ORAL | Status: DC | PRN
  Administered 2022-01-17 – 2022-01-18 (×2): 10 mg via ORAL
  Filled 2022-01-17 (×2): qty 2

## 2022-01-17 MED ORDER — MORPHINE SULFATE ER 15 MG PO TBCR: 15.0000 mg | EXTENDED_RELEASE_TABLET | Freq: Two times a day (BID) | ORAL | Status: DC

## 2022-01-17 NOTE — Evaluation (Signed)
Physical Therapy Evaluation Patient Details Name: Brittany Carroll MRN: [id number] DOB: 10/30/1955 Today's Date: 01/17/2022  History of Present Illness  66 y/o female presented to ED on 01/15/22 for hypotension, diarrhea, and back pain x 4-5 days. Admitted for acute metabolic encephalopathy, acute renal failure, and sepsis 2/2 possible pneumonia. CT showed heterogenous opacities in R lung. PMH: T2DM, HTN, macular degeneration, fibromyalgia.  Clinical Impression  Patient admitted with the above. PTA, patient lives alone and was independent with mobility with use of SPC and occasional use of rollator dependent on pain. Granddaughter checks in daily to assist with cleaning and caring for her 6 cats. Patient presents with weakness, decreased activity tolerance, impaired balance, and R hip pain. Patient overall functioning at min guard level with use of RW. Limited by R hip pain but otherwise mobilizing well. Encouraged use of RW or rollator at home for stability and safety. Patient will benefit from skilled PT services during acute stay to address listed deficits. Recommend HHPT at discharge to maximize functional independence and safety. Discussed with patient about granddaughter continuing to check on patient daily for safety and assistance.        Recommendations for follow up therapy are one component of a multi-disciplinary discharge planning process, led by the attending physician.  Recommendations may be updated based on patient status, additional functional criteria and insurance authorization.  Follow Up Recommendations Home health PT      Assistance Recommended at Discharge Intermittent Supervision/Assistance  Patient can return home with the following  A little help with walking and/or transfers;A little help with bathing/dressing/bathroom;Assistance with cooking/housework;Help with stairs or ramp for entrance    Equipment Recommendations Rolling Ray Gervasi (2 wheels)  Recommendations for Other  Services  OT consult    Functional Status Assessment Patient has had a recent decline in their functional status and demonstrates the ability to make significant improvements in function in a reasonable and predictable amount of time.     Precautions / Restrictions Precautions Precautions: Fall Precaution Comments: R hip pain Restrictions Weight Bearing Restrictions: No      Mobility  Bed Mobility               General bed mobility comments: sitting EOB on arrival    Transfers Overall transfer level: Needs assistance Equipment used: Rolling Xavius Spadafore (2 wheels) Transfers: Sit to/from Stand Sit to Stand: Min guard           General transfer comment: min guard for safety    Ambulation/Gait Ambulation/Gait assistance: Min guard Gait Distance (Feet): 70 Feet Assistive device: Rolling Lori Popowski (2 wheels) Gait Pattern/deviations: Step-through pattern, Decreased stride length, Decreased stance time - right, Decreased weight shift to right, Trunk flexed Gait velocity: decreased     General Gait Details: min guard for safety. Decreased stance time on R due to pain but able to perform step through pattern. No LOB noted during mobility  Stairs            Wheelchair Mobility    Modified Rankin (Stroke Patients Only)       Balance Overall balance assessment: Needs assistance Sitting-balance support: No upper extremity supported, Feet supported Sitting balance-Leahy Scale: Good     Standing balance support: Bilateral upper extremity supported, Reliant on assistive device for balance Standing balance-Leahy Scale: Poor Standing balance comment: reliant on RW for support due to R hip pain  Pertinent Vitals/Pain Pain Assessment Pain Assessment: Faces Faces Pain Scale: Hurts even more Pain Location: R hip Pain Descriptors / Indicators: Discomfort, Grimacing, Guarding Pain Intervention(s): Monitored during session,  Repositioned, Limited activity within patient's tolerance    Home Living Family/patient expects to be discharged to:: Private residence Living Arrangements: Alone Available Help at Discharge: Family;Other (Comment) (granddaughter can check on her every day) Type of Home: Apartment Home Access: Stairs to enter Entrance Stairs-Rails: Right Entrance Stairs-Number of Steps: 3   Home Layout: One level Home Equipment: Educational psychologist (4 wheels);Cane - single point      Prior Function Prior Level of Function : Independent/Modified Independent;Driving             Mobility Comments: reports use of cane primarily but uses rollator when in pain ADLs Comments: granddaughter assists with cleaning and caring for cats (6). Independent for ADLs     Hand Dominance        Extremity/Trunk Assessment   Upper Extremity Assessment Upper Extremity Assessment: Generalized weakness    Lower Extremity Assessment Lower Extremity Assessment: Generalized weakness (c/o R hip pain at rest and with mobility. No hx of injury)    Cervical / Trunk Assessment Cervical / Trunk Assessment: Kyphotic  Communication   Communication: No difficulties  Cognition Arousal/Alertness: Awake/alert Behavior During Therapy: WFL for tasks assessed/performed Overall Cognitive Status: No family/caregiver present to determine baseline cognitive functioning                                 General Comments: seems WFL for basic mobility tasks        General Comments      Exercises     Assessment/Plan    PT Assessment Patient needs continued PT services  PT Problem List Decreased strength;Decreased activity tolerance;Decreased balance;Decreased mobility;Pain       PT Treatment Interventions DME instruction;Gait training;Stair training;Functional mobility training;Therapeutic activities;Therapeutic exercise;Balance training;Patient/family education    PT Goals (Current goals can be found  in the Care Plan section)  Acute Rehab PT Goals Patient Stated Goal: to reduce pain PT Goal Formulation: With patient Time For Goal Achievement: 01/31/22 Potential to Achieve Goals: Good    Frequency Min 3X/week     Co-evaluation               AM-PAC PT "6 Clicks" Mobility  Outcome Measure Help needed turning from your back to your side while in a flat bed without using bedrails?: A Little Help needed moving from lying on your back to sitting on the side of a flat bed without using bedrails?: A Little Help needed moving to and from a bed to a chair (including a wheelchair)?: A Little Help needed standing up from a chair using your arms (e.g., wheelchair or bedside chair)?: A Little Help needed to walk in hospital room?: A Little Help needed climbing 3-5 steps with a railing? : A Little 6 Click Score: 18    End of Session   Activity Tolerance: Patient tolerated treatment well Patient left: in chair;with call bell/phone within reach Nurse Communication: Mobility status;Other (comment) (discussed with RN about chair alarm not being on, RN okay with set up) PT Visit Diagnosis: Muscle weakness (generalized) (M62.81);Unsteadiness on feet (R26.81);Pain;Difficulty in walking, not elsewhere classified (R26.2) Pain - Right/Left: Right Pain - part of body: Hip    Time: 8088-1103 PT Time Calculation (min) (ACUTE ONLY): 15 min   Charges:   PT Evaluation $PT  Eval Low Complexity: 1 Low          Rishik Tubby A. Dan Humphreys PT, DPT Acute Rehabilitation Services Office [phone]   Viviann Spare 01/17/2022, 9:47 AM

## 2022-01-17 NOTE — TOC Initial Note (Signed)
Transition of Care [facility]) - Initial/Assessment Note    Patient Details  Name: Brittany Carroll MRN: [id number] Date of Birth: [birth year]  Transition of Care [facility]) CM/SW Contact:    Epifanio Lesches, RN Phone Number: 01/17/2022, 3:15 PM  Clinical Narrative:          Admitted with acute metabolic encephalopathy.    NCM spoke with pt regarding d/c planning. From home alone. PTA independent with ADL's. States has RW and cane @ home. Shared PT's recommendations for HHPT. Pt agreeable to home health services. Pt without preference. Referral made with [facility] and accepted pending MD's order/face to face.   Expected Discharge Plan: Home w Home Health Services Barriers to Discharge: Continued Medical Work up   Patient Goals and CMS Choice     Choice offered to / list presented to : Patient  Expected Discharge Plan and Services Expected Discharge Plan: Home w Home Health Services   Discharge Planning Services: CM Consult   Living arrangements for the past 2 months: Apartment                                      Prior Living Arrangements/Services Living arrangements for the past 2 months: Apartment Lives with:: Self Patient language and need for interpreter reviewed:: Yes Do you feel safe going back to the place where you live?: Yes      Need for Family Participation in Patient Care: Yes (Comment) Care giver support system in place?: Yes (comment)   Criminal Activity/Legal Involvement Pertinent to Current Situation/Hospitalization: No - Comment as needed  Activities of Daily Living Home Assistive Devices/Equipment: Cane (specify quad or straight), Shower chair with back ADL Screening (condition at time of admission) [name] cognitive ability adequate to safely complete daily activities?: Yes Is the patient deaf or have difficulty hearing?: No Does the patient have difficulty seeing, even when wearing glasses/contacts?: No Does the patient have difficulty  concentrating, remembering, or making decisions?: No Patient able to express need for assistance with ADLs?: Yes Does the patient have difficulty dressing or bathing?: Yes Independently performs ADLs?: No Communication: Independent Dressing (OT): Needs assistance Is this a change from baseline?: Change from baseline, expected to last >3 days Grooming: Needs assistance Is this a change from baseline?: Change from baseline, expected to last >3 days Feeding: Independent Bathing: Needs assistance Is this a change from baseline?: Change from baseline, expected to last >3 days Toileting: Needs assistance Is this a change from baseline?: Change from baseline, expected to last >3days In/Out Bed: Needs assistance Is this a change from baseline?: Change from baseline, expected to last >3 days Walks in Home: Needs assistance Is this a change from baseline?: Change from baseline, expected to last >3 days Does the patient have difficulty walking or climbing stairs?: Yes Weakness of Legs: Both Weakness of Arms/Hands: None  Permission Sought/Granted                  Emotional Assessment Appearance:: Appears stated age Attitude/Demeanor/Rapport: Gracious Affect (typically observed): Accepting Orientation: : Oriented to Self, Oriented to Place, Oriented to  Time Alcohol / Substance Use: Not Applicable Psych Involvement: No (comment)  Admission diagnosis:  Metabolic encephalopathy [G93.41] Acute kidney failure (HCC) [N17.9] Acute renal failure, unspecified acute renal failure type (HCC) [N17.9] Altered mental status, unspecified altered mental status type [R41.82] Patient Active Problem List   Diagnosis Date Noted   Chronic prescription opiate  use 01/15/2022   Acute metabolic encephalopathy 01/15/2022   Hypotension 01/15/2022   Sepsis (HCC) 01/15/2022   Hyperthyroidism 01/15/2022   DM2 (diabetes mellitus, type 2) (HCC) 01/15/2022   Acute kidney failure (HCC) 01/15/2022   Abnormal  liver CT 01/15/2022   Resistant hypertension    Abnormal renal ultrasound    History of lumbar fusion 02/17/2019   Spinal stenosis of lumbar region 12/25/2018   Spondylolisthesis, grade 1 12/25/2018   Low back pain 07/12/2017   PCP:  Ellyn Hack, MD Pharmacy:   [facility] - [place], [place] - [address] [place] [address] Phone: [phone] Fax: [phone]     Social Determinants of Health (SDOH) Interventions    Readmission Risk Interventions     No data to display

## 2022-01-17 NOTE — Evaluation (Signed)
Occupational Therapy Evaluation Patient Details Name: Brittany Carroll MRN: [id number] DOB: 03/22/1956 Today's Date: 01/17/2022   History of Present Illness 66 y/o female presented to ED on 01/15/22 for hypotension, diarrhea, and back pain x 4-5 days. Admitted for acute metabolic encephalopathy, acute renal failure, and sepsis 2/2 possible pneumonia. CT showed heterogenous opacities in R lung. PMH: T2DM, HTN, macular degeneration, fibromyalgia.   Clinical Impression   PTA, pt lives alone, typically Modified Independent with ADLs/mobility using cane vs Rollator (pending R hip pain). Family assist frequently with IADLs. Pt presents now with primary limitation being R hip pain today. Trialed Rollator vs RW for bathroom mobility with improved stability noted using RW. Pt overall Modified Independent with UB ADLs and up to Min A for LB ADLs. Plan to further address LB dressing/bathing with AE education as needed and tub transfers in next sessions. If R hip pain still a limiting factor by DC, may need HHOT follow-up to decrease fall risk with daily routine.       Recommendations for follow up therapy are one component of a multi-disciplinary discharge planning process, led by the attending physician.  Recommendations may be updated based on patient status, additional functional criteria and insurance authorization.   Follow Up Recommendations  Home health OT    Assistance Recommended at Discharge Intermittent Supervision/Assistance  Patient can return home with the following A little help with bathing/dressing/bathroom;Assistance with cooking/housework;Help with stairs or ramp for entrance    Functional Status Assessment  Patient has had a recent decline in their functional status and demonstrates the ability to make significant improvements in function in a reasonable and predictable amount of time.  Equipment Recommendations  Other (comment) (RW)    Recommendations for Other Services        Precautions / Restrictions Precautions Precautions: Fall Precaution Comments: R hip pain Restrictions Weight Bearing Restrictions: No      Mobility Bed Mobility               General bed mobility comments: up in chair on entry    Transfers Overall transfer level: Needs assistance Equipment used: Rolling walker (2 wheels), Rollator (4 wheels) Transfers: Sit to/from Stand Sit to Stand: Min guard           General transfer comment: cues for rollator brake mgmt and safety with initial stand from recliner. improved stability when standing from toilet with RW trial      Balance Overall balance assessment: Needs assistance Sitting-balance support: No upper extremity supported, Feet supported Sitting balance-Leahy Scale: Good     Standing balance support: Bilateral upper extremity supported, Reliant on assistive device for balance Standing balance-Leahy Scale: Poor Standing balance comment: reliant on RW for support due to R hip pain                           ADL either performed or assessed with clinical judgement   ADL Overall ADL's : Needs assistance/impaired Eating/Feeding: Independent   Grooming: Supervision/safety;Standing;Wash/dry hands   Upper Body Bathing: Modified independent;Sitting   Lower Body Bathing: Min guard;Sit to/from stand;Sitting/lateral leans   Upper Body Dressing : Modified independent;Sitting   Lower Body Dressing: Minimal assistance;Sit to/from stand   Toilet Transfer: Min guard;Ambulation;Comfort height toilet;Rollator (4 wheels);Rolling walker (2 wheels) Toilet Transfer Details (indicate cue type and reason): Trialed Rollator (pt uses this at home) to get to bathroom though unsteady d/t need to push through BUE to offload painful Rhip. On exit, used RW  with improved stability noted with pt agreeing as well Toileting- Clothing Manipulation and Hygiene: Min guard;Sitting/lateral lean;Sit to/from stand       Functional  mobility during ADLs: Min guard;Rolling walker (2 wheels);Rollator (4 wheels);Cueing for sequencing;Cueing for safety General ADL Comments: Limited primarily by R hip pain making gait unsteady     Vision Ability to See in Adequate Light: 0 Adequate Patient Visual Report: No change from baseline Vision Assessment?: No apparent visual deficits     Perception     Praxis      Pertinent Vitals/Pain Pain Assessment Pain Assessment: Faces Faces Pain Scale: Hurts even more Pain Location: R hip Pain Descriptors / Indicators: Discomfort, Grimacing, Guarding Pain Intervention(s): Monitored during session, Heat applied, Repositioned     Hand Dominance Right   Extremity/Trunk Assessment Upper Extremity Assessment Upper Extremity Assessment: Overall WFL for tasks assessed   Lower Extremity Assessment Lower Extremity Assessment: Defer to PT evaluation   Cervical / Trunk Assessment Cervical / Trunk Assessment: Kyphotic   Communication Communication Communication: No difficulties   Cognition Arousal/Alertness: Awake/alert Behavior During Therapy: WFL for tasks assessed/performed Overall Cognitive Status: No family/caregiver present to determine baseline cognitive functioning                                 General Comments: flat affect though sleepy on entry. follows directions though some cues needed for safety awareness     General Comments       Exercises     Shoulder Instructions      Home Living Family/patient expects to be discharged to:: Private residence Living Arrangements: Alone Available Help at Discharge: Family;Other (Comment) (daughter, granddaughter (checks on her every day)) Type of Home: Apartment Home Access: Stairs to enter [employer] of Steps: 3 Entrance Stairs-Rails: Right Home Layout: One level     Bathroom Shower/Tub: Chief Strategy Officer: Standard     Home Equipment: Educational psychologist (4 wheels);Cane  - single point          Prior Functioning/Environment Prior Level of Function : Independent/Modified Independent;Driving             Mobility Comments: reports use of cane primarily but uses rollator when in pain ADLs Comments: granddaughter assists with cleaning and caring for cats (6). Independent for ADLs        OT Problem List: Decreased strength;Decreased activity tolerance;Impaired balance (sitting and/or standing);Decreased cognition;Decreased safety awareness;Decreased knowledge of use of DME or AE;Pain      OT Treatment/Interventions: Self-care/ADL training;Therapeutic exercise;Energy conservation;DME and/or AE instruction;Therapeutic activities    OT Goals(Current goals can be found in the care plan section) Acute Rehab OT Goals Patient Stated Goal: decrease R hip pain OT Goal Formulation: With patient Time For Goal Achievement: 01/31/22 Potential to Achieve Goals: Good  OT Frequency: Min 2X/week    Co-evaluation              AM-PAC OT "6 Clicks" Daily Activity     Outcome Measure Help from another person eating meals?: None Help from another person taking care of personal grooming?: A Little Help from another person toileting, which includes using toliet, bedpan, or urinal?: A Little Help from another person bathing (including washing, rinsing, drying)?: A Little Help from another person to put on and taking off regular upper body clothing?: None Help from another person to put on and taking off regular lower body clothing?: A Little 6 Click Score: 20  End of Session Equipment Utilized During Treatment: Rolling walker (2 wheels);Rollator (4 wheels);Gait belt Nurse Communication: Mobility status;Other (comment) (discussed with NT)  Activity Tolerance: Patient limited by pain Patient left: in chair;with call bell/phone within reach  OT Visit Diagnosis: Unsteadiness on feet (R26.81);Other abnormalities of gait and mobility (R26.89)                Time:  7169-6789 OT Time Calculation (min): 18 min Charges:  OT General Charges $OT Visit: 1 Visit OT Evaluation $OT Eval Low Complexity: 1 Low  Bradd Canary, OTR/L Acute Rehab Services Office: [phone]   Lorre Munroe 01/17/2022, 12:17 PM

## 2022-01-17 NOTE — Progress Notes (Addendum)
         [facility]                                                                              Brittany Carroll, is a 66 y.o. female, DOB - 01/07/1956, MRN:[id number] Admit date - 01/15/2022    Outpatient Primary MD for the patient is Shah, Syed Asad A, MD  LOS - 2  days        Brief summary   Patient is a 66 years old female with PMH significant for HTN, DM 2, fibromyalgia, tobacco abuse, cervical fusion, history of prior A-fib presented in the ED with complaints of altered mentation and diarrhea.  Family reports that she has had gradual decline in mental status over the past 3 weeks.  She went to her PCP and found to have a blood pressure of 80/50 with altered mentation and was sent in the ED.   Labs consistent with acute kidney injury with serum creatinine up to 8.0.  Patient was admitted for further work-up   Assessment & Plan    Principal Problem: Acute kidney injury -Multifactorial likely due to hypotension in the setting of diarrhea, continuation of the home medications including HCTZ, losartan -Creatinine 0.78 in 09/2021, presented with creatinine of 8.3. -Renal ultrasound showed no hydronephrosis, no abnormality, patient placed on IV fluid hydration -Creatinine improving, 8.3> 7.0> 6.4> 1.73 -Nephrology following, stopped IV fluid hydration  Active Problems: Acute metabolic encephalopathy -Likely due to uremic encephalopathy, resolved, currently alert and oriented x3, appears back to her baseline    Hypotension, sepsis secondary to pneumonia (HCC) Patient met sepsis criteria on admission with tachycardia, tachypnea, hypotension, lactic acidosis, leukocytosis -CT showed heterogenous opacities in the right lung, placed on empiric antibiotics  Acute on chronic back pain with radiculopathy -Complaining of pain radiating to both legs, rt >left, prior history of lumbar fusion surgery -Obtain MRI of the lumbar spine -Does not take MS Contin at home, decrease  oxycodone from 20 mg to 5-10 mg every 6 hours as needed for pain -Continue Lyrica, PT OT evaluation  Diabetes mellitus type 2, NIDDM -Continue sliding scale insulin while inpatient Recent Labs    01/16/22 0137 01/16/22 1426 01/16/22 1626 01/16/22 2141 01/17/22 0814 01/17/22 1204  GLUCAP 85 113* 126* 159* 150* 123*     Hyperthyroidism -Continue Tapazole  Microcytic hypochromic anemia -Iron 325, percent saturation ratio 9, ferritin 64 -Placed on iron replacement upon DC, outpatient further work-up  Hypomagnesium  hypophosphatemia -Replaced  Obesity Estimated body mass index is 36.02 kg/m as calculated from the following:   Height as of this encounter: 5' 7" (1.702 m).   Weight as of this encounter: 104.3 kg.  Code Status: Full code DVT Prophylaxis:  heparin injection 5,000 Units Start: 01/15/22 2330   Level of Care: Level of care: Telemetry Medical Family Communication: Updated patient'   Disposition Plan:      Remains inpatient appropriate: Hopefully DC home tomorrow if creatinine improved, pending MRI lumbar spine   Procedures:  None  Consultants:   Nephrology  Antimicrobials:   Anti-infectives (From admission, onward)    Start     Dose/Rate Route Frequency Ordered Stop   01/17/22 2200    azithromycin [facility]) tablet 500 mg        500 mg Oral Daily at bedtime 01/17/22 1121 01/20/22 2159   01/16/22 2200  cefTRIAXone (ROCEPHIN) 1 g in sodium chloride 0.9 % 100 mL IVPB        1 g 200 mL/hr over 30 Minutes Intravenous Every 24 hours 01/16/22 1110     01/16/22 2200  azithromycin (ZITHROMAX) 500 mg in sodium chloride 0.9 % 250 mL IVPB  Status:  Discontinued        500 mg 250 mL/hr over 60 Minutes Intravenous Every 24 hours 01/16/22 1110 01/17/22 1121   01/15/22 2045  cefTRIAXone (ROCEPHIN) 1 g in sodium chloride 0.9 % 100 mL IVPB        1 g 200 mL/hr over 30 Minutes Intravenous  Once 01/15/22 2038 01/15/22 2159   01/15/22 2045  azithromycin (ZITHROMAX) 500  mg in sodium chloride 0.9 % 250 mL IVPB        500 mg 250 mL/hr over 60 Minutes Intravenous  Once 01/15/22 2038 01/15/22 2332          Medications  aspirin EC  325 mg Oral Daily   azithromycin  500 mg Oral QHS   heparin  5,000 Units Subcutaneous Q8H   insulin aspart  0-5 Units Subcutaneous QHS   insulin aspart  0-9 Units Subcutaneous TID WC   methimazole  10 mg Oral Daily   pregabalin  75 mg Oral Daily   rosuvastatin  10 mg Oral QHS      Subjective:   Brittany Carroll was seen and examined today.  +low back pain, radiating to the legs.  No numbness or tingling, urinary incontinence or bowel incontinence.  Patient denies dizziness, chest pain, shortness of breath, abdominal pain, N/V/D/C.  Objective:   Vitals:   01/16/22 2332 01/17/22 0402 01/17/22 0817 01/17/22 1423  BP: (!) 142/67 116/81 (!) 155/76 (!) 161/77  Pulse: (!) 102 100 98 94  Resp: _0 Temp: 98.4 F (36.9 C) 98.5 F (36.9 C) 98.4 F (36.9 C) 98.4 F (36.9 C)  TempSrc: Oral Oral    SpO2: 95% 93% 96% 99%  Weight:      Height:        Intake/Output Summary (Last 24 hours) at 01/17/2022 1529 Last data filed at 01/17/2022 1300 Gross per 24 hour  Intake 1865 ml  Output 1750 ml  Net 115 ml     Wt Readings from Last 3 Encounters:  01/15/22 104.3 kg  12/11/21 100 kg  10/11/21 102.2 kg     Exam General: Alert and oriented x 3, NAD Cardiovascular: S1 S2 auscultated,  RRR Respiratory: Clear to auscultation bilaterally, no wheezing, rales Gastrointestinal: Soft, nontender, nondistended, + bowel sounds Ext: no pedal edema bilaterally Neuro: Strength 5/5 upper and lower extremities bilaterally Psych: Normal affect and demeanor, alert and oriented x3     Data Reviewed:  I have personally reviewed following labs    CBC Lab Results  Component Value Date   WBC 8.2 01/17/2022   RBC 3.82 (L) 01/17/2022   HGB 9.7 (L) 01/17/2022   HCT 28.5 (L) 01/17/2022   MCV 74.6 (L) 01/17/2022   MCH 25.4  (L) 01/17/2022   PLT 244 01/17/2022   MCHC 34.0 01/17/2022   RDW 15.7 (H) 01/17/2022   LYMPHSABS 3.4 01/15/2022   MONOABS 1.3 (H) 01/15/2022   EOSABS 0.1 01/15/2022   BASOSABS 0.1 39/76/7341     Last metabolic panel Lab Results  Component Value  Date   NA 137 01/17/2022   NA 137 01/17/2022   K 3.5 01/17/2022   K 3.6 01/17/2022   CL 108 01/17/2022   CL 108 01/17/2022   CO2 21 (L) 01/17/2022   CO2 20 (L) 01/17/2022   BUN 56 (H) 01/17/2022   BUN 55 (H) 01/17/2022   CREATININE 1.78 (H) 01/17/2022   CREATININE 1.73 (H) 01/17/2022   GLUCOSE 113 (H) 01/17/2022   GLUCOSE 113 (H) 01/17/2022   GFRNONAA 31 (L) 01/17/2022   GFRNONAA 32 (L) 01/17/2022   CALCIUM 8.5 (L) 01/17/2022   CALCIUM 8.5 (L) 01/17/2022   PHOS 2.4 (L) 01/17/2022   PHOS 2.4 (L) 01/17/2022   PROT 6.9 01/15/2022   ALBUMIN 2.9 (L) 01/17/2022   BILITOT 0.5 01/15/2022   ALKPHOS 84 01/15/2022   AST 146 (H) 01/15/2022   ALT 56 (H) 01/15/2022   ANIONGAP 8 01/17/2022   ANIONGAP 9 01/17/2022    CBG (last 3)  Recent Labs    01/16/22 2141 01/17/22 0814 01/17/22 1204  GLUCAP 159* 150* 123*      Coagulation Profile: Recent Labs  Lab 01/15/22 1720  INR 1.2     Radiology Studies: I have personally reviewed the imaging studies  US RENAL  Result Date: 01/16/2022 CLINICAL DATA:  66 year old female history of acute kidney injury. IMPRESSION: 1. No acute findings.  Specifically, no hydronephrosis. 2. Small Bosniak class 1 and Bosniak class 2 cysts in the kidneys, as above. No imaging follow-up is recommended. Electronically Signed   By: Vinnie Langton M.D.   On: 01/16/2022 07:18   CT ABDOMEN PELVIS WO CONTRAST  Result Date: 01/15/2022 IMPRESSION: 1. Heterogeneous consolidations in the left greater than right lung base which may be due to atelectasis, pneumonia or aspiration. 2. Suspect subtle contour nodularity of the liver as may be seen with cirrhosis. Correlate for risk factors 3. Scattered fluid-filled  nondilated small and large bowel without definitive point of obstruction. Findings could be secondary to mild ileus or enteritis. No acute bowel wall thickening is seen. Electronically Signed   By: Donavan Foil M.D.   On: 01/15/2022 22:53   CT Head Wo Contrast  Result Date: 01/15/2022 IMPRESSION: No acute intracranial findings are seen in noncontrast CT brain. Other findings as described in the body of the report. Electronically Signed   By: Elmer Picker M.D.   On: 01/15/2022 18:18   DG Chest 1 View  Result Date: 01/15/2022  IMPRESSION: Ill-defined opacities in both lung bases and left midlung zone, may represent atelectasis versus multifocal infection. Electronically Signed   By: Keith Rake M.D.   On: 01/15/2022 17:32       Brittany Carroll M.D. [facility] 01/17/2022, 3:29 PM  Available via Epic secure chat 7am-7pm After 7 pm, please refer to night coverage provider listed on amion.

## 2022-01-17 NOTE — Progress Notes (Signed)
Admit: 01/15/2022 LOS: 2  41F AKI during acute diarrheal episode on HCTZ/ARB  Subjective:  AML pending  BPs stable, 1.8L UOP  On NS @ 36mL/h Good PO No further diarrhea Feels improved  08/22 0701 - 08/23 0700 In: 1385 [P.O.:360; I.V.:675; IV Piggyback:350] Out: 1750 [Urine:1750]  Filed Weights   01/15/22 1727  Weight: 104.3 kg    Scheduled Meds:  aspirin EC  325 mg Oral Daily   azithromycin  500 mg Oral QHS   heparin  5,000 Units Subcutaneous Q8H   insulin aspart  0-5 Units Subcutaneous QHS   insulin aspart  0-9 Units Subcutaneous TID WC   methimazole  10 mg Oral Daily   pregabalin  75 mg Oral Daily   rosuvastatin  10 mg Oral QHS   Continuous Infusions:  sodium chloride 75 mL/hr at 01/17/22 0816   cefTRIAXone (ROCEPHIN)  IV 1 g (01/16/22 2153)   PRN Meds:.acetaminophen **OR** acetaminophen, ondansetron **OR** ondansetron (ZOFRAN) IV  Current Labs: reviewed    Physical Exam:  Blood pressure (!) 155/76, pulse 98, temperature 98.4 F (36.9 C), resp. rate 17, height 5\' 7"  (1.702 m), weight 104.3 kg, SpO2 96 %. NAD< sitting in chair eating lunch No LEE RRR CTAB S/nt, nabs  A Nonoliguric AKI, likely pre-renal + ARB Normal baseline Renal reassuring, bosniak 1 and 2 cysts present Improving with hydration Holding ARB UA bland w/o hematuria or pyuria, proteinuria Acute diarrhea AMS probably from meds + reduced GFR; much improved today ? PNA on ABX per TRH DM2 HTN home meds held.   P Stop IVFs Cont PO hydration Await labs for today Daily weights, Daily Renal Panel, Strict I/Os, Avoid nephrotoxins (NSAIDs, judicious IV Contrast)   [place] MD 01/17/2022, 12:34 PM  Recent Labs  Lab 01/15/22 1720 01/15/22 2019 01/15/22 2204 01/16/22 0254  NA 131* 130*  129* 130* 132*  K 5.2* 5.0  5.0 5.3* 4.5  CL 94* 97* 97* 102  CO2 18*  --  17* 17*  GLUCOSE 103* 112* 110* 93  BUN 96* 113* 95* 92*  CREATININE 7.81* 8.30* 7.08* 6.44*  CALCIUM 8.7*  --  8.2*  8.0*   Recent Labs  Lab 01/15/22 1720 01/15/22 2019 01/16/22 0254 01/17/22 1050  WBC 15.3*  --  13.0* 7.7  NEUTROABS 10.4*  --   --   --   HGB 10.6* 10.5*  10.9* 10.0* 10.0*  HCT 32.3* 31.0*  32.0* 31.2* 29.5*  MCV 76.4*  --  78.2* 74.1*  PLT 322  --  267 242

## 2022-01-18 DIAGNOSIS — N170 Acute kidney failure with tubular necrosis: Secondary | ICD-10-CM | POA: Diagnosis not present

## 2022-01-18 DIAGNOSIS — N179 Acute kidney failure, unspecified: Secondary | ICD-10-CM | POA: Diagnosis not present

## 2022-01-18 DIAGNOSIS — R932 Abnormal findings on diagnostic imaging of liver and biliary tract: Secondary | ICD-10-CM | POA: Diagnosis not present

## 2022-01-18 DIAGNOSIS — G9341 Metabolic encephalopathy: Secondary | ICD-10-CM | POA: Diagnosis not present

## 2022-01-18 LAB — BASIC METABOLIC PANEL
Anion gap: 10 (ref 5–15)
BUN: 37 mg/dL — ABNORMAL HIGH (ref 8–23)
CO2: 21 mmol/L — ABNORMAL LOW (ref 22–32)
Calcium: 8.9 mg/dL (ref 8.9–10.3)
Chloride: 108 mmol/L (ref 98–111)
Creatinine, Ser: 1.09 mg/dL — ABNORMAL HIGH (ref 0.44–1.00)
GFR, Estimated: 56 mL/min — ABNORMAL LOW (ref >60)
Glucose, Bld: 119 mg/dL — ABNORMAL HIGH (ref 70–99)
Potassium: 3.4 mmol/L — ABNORMAL LOW (ref 3.5–5.1)
Sodium: 139 mmol/L (ref 135–145)

## 2022-01-18 LAB — MAGNESIUM: Magnesium: 1.7 mg/dL (ref 1.7–2.4)

## 2022-01-18 LAB — URINE CULTURE

## 2022-01-18 LAB — GLUCOSE, CAPILLARY: Glucose-Capillary: 166 mg/dL — ABNORMAL HIGH (ref 70–99)

## 2022-01-18 MED ORDER — HYDRALAZINE HCL 50 MG PO TABS
50.0000 mg | ORAL_TABLET | Freq: Three times a day (TID) | ORAL | Status: DC
  Administered 2022-01-18: 50 mg via ORAL
  Filled 2022-01-18: qty 1

## 2022-01-18 MED ORDER — AMOXICILLIN-POT CLAVULANATE 875-125 MG PO TABS
1.0000 | ORAL_TABLET | Freq: Two times a day (BID) | ORAL | Status: DC
  Administered 2022-01-18: 1 via ORAL
  Filled 2022-01-18: qty 1

## 2022-01-18 MED ORDER — POTASSIUM CHLORIDE CRYS ER 20 MEQ PO TBCR
40.0000 meq | EXTENDED_RELEASE_TABLET | Freq: Once | ORAL | Status: AC
  Administered 2022-01-18: 40 meq via ORAL
  Filled 2022-01-18: qty 2

## 2022-01-18 MED ORDER — HYDRALAZINE HCL 50 MG PO TABS
50.0000 mg | ORAL_TABLET | Freq: Three times a day (TID) | ORAL | Status: DC
Start: 2022-01-18 — End: 2022-01-18

## 2022-01-18 MED ORDER — CLONIDINE HCL 0.2 MG/24HR TD PTWK
0.2000 mg | MEDICATED_PATCH | TRANSDERMAL | Status: DC
Start: 2022-01-18 — End: 2022-01-18
  Filled 2022-01-18: qty 1

## 2022-01-18 MED ORDER — BUPROPION HCL ER (XL) 150 MG PO TB24
150.0000 mg | ORAL_TABLET | Freq: Every day | ORAL | Status: DC
  Administered 2022-01-18: 150 mg via ORAL
  Filled 2022-01-18: qty 1

## 2022-01-18 MED ORDER — AMOXICILLIN-POT CLAVULANATE 875-125 MG PO TABS: 1.0000 | ORAL_TABLET | Freq: Two times a day (BID) | ORAL | 0 refills | Status: AC

## 2022-01-18 MED ORDER — LOSARTAN POTASSIUM 100 MG PO TABS: 100.0000 mg | ORAL_TABLET | Freq: Every day | ORAL | 0 refills | Status: AC

## 2022-01-18 NOTE — Discharge Summary (Signed)
Physician Discharge Summary   Patient: Brittany Carroll MRN: [id number] DOB: [birth year]  Admit date:     01/15/2022  Discharge date: 01/18/22  Discharge Physician: Estill Cotta   PCP: [name], MD   Recommendations at discharge:   Continue Augmentin twice daily for 5 more days  Discharge Diagnoses:    Acute metabolic encephalopathy likely due to uremia, resolved   Hypotension Sepsis secondary to pneumonia   Acute kidney failure ([place]) resolved   Resistant hypertension   Chronic prescription opiate use   Hyperthyroidism   DM2 (diabetes mellitus, type 2) [facility])     Hospital Course: Patient is a 66 years old female with PMH significant for HTN, DM 2, fibromyalgia, tobacco abuse, cervical fusion, history of prior A-fib presented in the ED with complaints of altered mentation and diarrhea.  Family reports that she has had gradual decline in mental status over the past 3 weeks.  She went to her PCP and found to have a blood pressure of 80/50 with altered mentation and was sent in the ED.   Labs consistent with acute kidney injury with serum creatinine up to 8.0.  Patient was admitted for further work-up  Assessment and Plan: Acute kidney injury -Multifactorial likely due to hypotension in the setting of diarrhea, continuation of the home medications including losartan -Creatinine 0.78 in 09/2021, presented with creatinine of 8.3. -Renal ultrasound showed no hydronephrosis, no abnormality, patient placed on IV fluid hydration -Creatinine improving, 8.3> 7.0> 6.4> 1.73-> 1.0 at discharge -Patient can resume losartan in 2 days, currently tolerating diet, no nausea vomiting or diarrhea    Acute metabolic encephalopathy -Likely due to uremic encephalopathy, resolved, currently alert and oriented x3, appears back to her baseline     Hypotension, sepsis secondary to pneumonia [facility]) Patient met sepsis criteria on admission with tachycardia, tachypnea, hypotension, lactic acidosis,  leukocytosis -CT showed heterogenous opacities in the right lung, placed on empiric antibiotics -Continue Augmentin for 5 more days to complete full course -BP now elevated, resumed antihypertensives   Acute on chronic back pain with radiculopathy -Complaining of pain radiating to both legs, rt >left, prior history of lumbar fusion surgery -MR I of the lumbar spine showed prior lumbar fusion with persistent severe spinal stenosis L2-3 -Continue outpatient pain medications, PT evaluation recommended home health PT, arranged   Diabetes mellitus type 2, NIDDM -CBG stable, continue outpatient regimen     Hyperthyroidism -Continue Tapazole   Microcytic hypochromic anemia -Iron 325, percent saturation ratio 9, ferritin 64 -Placed on iron replacement upon DC, outpatient further work-up   Hypomagnesium  hypophosphatemia -Replaced   Obesity Estimated body mass index is 36.02 kg/m as calculated from the following:   Height as of this encounter: _0  (1.702 m).   Weight as of this encounter: 104.3 kg.        Pain control - [employer] Controlled Substance Reporting System database was reviewed. and patient was instructed, not to drive, operate heavy machinery, perform activities at heights, swimming or participation in water activities or provide baby-sitting services while on Pain, Sleep and Anxiety Medications; until their outpatient Physician has advised to do so again. Also recommended to not to take more than prescribed Pain, Sleep and Anxiety Medications.  Consultants: Nephrology Procedures performed: None Disposition: Home Diet recommendation:  Discharge Diet Orders (From admission, onward)     Start     Ordered   01/18/22 0000  Diet Carb Modified        01/18/22 0949  Carb modified diet DISCHARGE MEDICATION: Allergies as of 01/18/2022       Reactions   Lisinopril Hives   Metformin And Related Hives        Medication List     STOP taking these  medications    morphine 15 MG 12 hr tablet Commonly known as: MS CONTIN       TAKE these medications    amitriptyline 75 MG tablet Commonly known as: ELAVIL Take 75 mg by mouth at bedtime.   amoxicillin-clavulanate 875-125 MG tablet Commonly known as: AUGMENTIN Take 1 tablet by mouth 2 (two) times daily for 5 days.   aspirin EC 325 MG tablet Take 325 mg by mouth daily.   buPROPion 150 MG 24 hr tablet Commonly known as: WELLBUTRIN XL Take 150 mg by mouth daily.   cholecalciferol 25 MCG (1000 UNIT) tablet Commonly known as: VITAMIN D3 Take 1,000 Units by mouth daily.   cloNIDine 0.2 mg/24hr patch Commonly known as: CATAPRES - Dosed in mg/24 hr Place 1 patch (0.2 mg total) onto the skin every 7 (seven) days.   esomeprazole 40 MG capsule Commonly known as: NEXIUM Take 40 mg by mouth daily as needed (acid reflux).   HumaLOG KwikPen 200 UNIT/ML KwikPen Generic drug: insulin lispro Inject 5 Units into the skin 3 (three) times daily before meals.   hydrALAZINE 50 MG tablet Commonly known as: APRESOLINE Take 1 tablet (50 mg total) by mouth 3 (three) times daily.   Jentadueto 2.09-998 MG Tabs Generic drug: linaGLIPtin-metFORMIN HCl Take 1 tablet by mouth 2 (two) times daily.   Lantus SoloStar 100 UNIT/ML Solostar Pen Generic drug: insulin glargine Inject 12 Units into the skin at bedtime.   losartan 100 MG tablet Commonly known as: COZAAR Take 1 tablet (100 mg total) by mouth daily. Start taking on: January 20, 2022 What changed: These instructions start on January 20, 2022. If you are unsure what to do until then, ask your doctor or other care provider.   magnesium oxide 400 MG tablet Commonly known as: MAG-OX Take 400 mg by mouth daily.   methimazole 10 MG tablet Commonly known as: TAPAZOLE Take 15 mg by mouth daily. Taking  1 1/2 tabs = 15 mg daily   metoprolol succinate 50 MG 24 hr tablet Commonly known as: TOPROL-XL TAKE 1 TABLET (50 MG TOTAL) BY MOUTH  EVERY EVENING. TAKE WITH OR IMMEDIATELY FOLLOWING A MEAL.   multivitamin with minerals Tabs tablet Take 1 tablet by mouth daily.   naloxone 4 MG/0.1ML Liqd nasal spray kit Commonly known as: NARCAN Place 1 spray into the nose as directed.   Oxycodone HCl 20 MG Tabs Take 20 mg by mouth 4 (four) times daily.   pregabalin 200 MG capsule Commonly known as: LYRICA Take 200 mg by mouth 2 (two) times daily.   Proctozone-HC 2.5 % rectal cream Generic drug: hydrocortisone Place 1 Application rectally 2 (two) times daily as needed for hemorrhoids or anal itching.   rosuvastatin 10 MG tablet Commonly known as: CRESTOR Take 1 tablet by mouth at bedtime.        Follow-up Information     [name], MD Follow up.   Specialty: Family Medicine Contact information: [address] [place] [address] [phone]         Care, [facility] Follow up.   Specialty: Home Health Services Contact information: [place] [address] [phone]  Discharge Exam: Filed Weights   01/15/22 1727  Weight: 104.3 kg   S: No acute complaints, ambulating.  Tolerating diet without any difficulty, looking forward to go home.  Vitals:   01/17/22 1423 01/17/22 1930 01/18/22 0338 01/18/22 0742  BP: (!) 161/77 (!) 163/89 (!) 184/82 (!) 175/82  Pulse: 94  65   Resp: _0 Temp: 98.4 F (36.9 C) 98.1 F (36.7 C) 98.2 F (36.8 C) 98.5 F (36.9 C)  TempSrc:   Oral Oral  SpO2: 99% 100% 98%   Weight:      Height:         Physical Exam General: Alert and oriented x 3, NAD Cardiovascular: S1 S2 clear, RRR.  Respiratory: CTAB, no wheezing, rales or rhonchi Gastrointestinal: Soft, nontender, nondistended, NBS Ext: no pedal edema bilaterally Neuro: no new deficits Psych: Normal affect and demeanor, alert and oriented x3    Condition at discharge: good  The results of significant diagnostics from this  hospitalization (including imaging, microbiology, ancillary and laboratory) are listed below for reference.   Imaging Studies: MR LUMBAR SPINE WO CONTRAST  Result Date: 01/17/2022 CLINICAL DATA:  Low back pain radiating down both legs, worse on the right. Prior lumbar surgery. EXAM: MRI LUMBAR SPINE WITHOUT CONTRAST TECHNIQUE: Multiplanar, multisequence MR imaging of the lumbar spine was performed. No intravenous contrast was administered. COMPARISON:  Lumbar myelogram 07/14/2021. Lumbar spine MRI 11/25/2019. FINDINGS: The patient terminated the examination prior to completion due to pain. Only sagittal T2 and sagittal STIR sequences were obtained and are motion degraded. Segmentation: Standard. Alignment:  Unchanged grade 1 anterolisthesis of L3 on L4. Vertebrae: Prior L2 to pelvic fusion with interbody implants at L2-3 and L5-S1. No evidence of an acute fracture or discitis. Conus medullaris and cauda equina: Conus extends to the L1 level and is grossly normal in signal. Paraspinal and other soft tissues: Postoperative changes throughout the posterior lumbar soft tissues. Disc levels: Limited assessment given the lack of axial imaging. Evidence of persistent severe spinal stenosis and at least moderate bilateral neural foraminal stenosis at L2-3. Mild chronic spinal stenosis at L1-2 in part due to prominent dorsal epidural fat. Grossly patent spinal canal from L3-S1. Moderate chronic residual left neural foraminal stenosis at L3-4. IMPRESSION: 1. Incomplete, motion degraded examination. 2. Prior L2 to pelvic fusion with persistent severe spinal stenosis at L2-3. Electronically Signed   By: Logan Bores M.D.   On: 01/17/2022 18:38   US RENAL  Result Date: 01/16/2022 CLINICAL DATA:  66 year old female history of acute kidney injury. EXAM: RENAL / URINARY TRACT ULTRASOUND COMPLETE COMPARISON:  No priors. FINDINGS: Right Kidney: Renal measurements: 12.5 x 4.9 x 5.8 cm = volume: 186.7 mL. In the interpolar  region of the right kidney there is a 3.4 x 1.9 x 2.5 cm generally anechoic lesion which appears to have at least 1 thin internal septation, compatible with a minimally complex (Bosniak class 2) cyst. Echogenicity within normal limits. No hydronephrosis visualized. Left Kidney: Renal measurements: 10.8 x 5.6 x 6.2 cm = volume: 198.2 mL. There are 2 lesions in the left kidney measuring 1.3 x 1.5 x 1.2 cm and 1.8 x 1.6 x 1.7 cm which are both anechoic with increased through transmission, compatible with small simple cysts (Bosniak class 1). Echogenicity within normal limits. No hydronephrosis visualized. Bladder: Appears normal for degree of bladder distention. Other: None. IMPRESSION: 1. No acute findings.  Specifically, no hydronephrosis. 2. Small Bosniak class 1 and Bosniak class 2 cysts in  the kidneys, as above. No imaging follow-up is recommended. Electronically Signed   By: Vinnie Langton M.D.   On: 01/16/2022 07:18   CT ABDOMEN PELVIS WO CONTRAST  Result Date: 01/15/2022 CLINICAL DATA:  Hypotension and diarrhea EXAM: CT ABDOMEN AND PELVIS WITHOUT CONTRAST TECHNIQUE: Multidetector CT imaging of the abdomen and pelvis was performed following the standard protocol without IV contrast. RADIATION DOSE REDUCTION: This exam was performed according to the departmental dose-optimization program which includes automated exposure control, adjustment of the mA and/or kV according to patient size and/or use of iterative reconstruction technique. COMPARISON:  CT 07/14/2021 FINDINGS: Lower chest: Lung bases demonstrate patchy consolidation at the left greater than right lung base. No pleural effusion. Normal cardiac size. Hepatobiliary: Suspicion of subtle contour nodularity of the liver. Status post cholecystectomy. No biliary dilatation Pancreas: Unremarkable. No pancreatic ductal dilatation or surrounding inflammatory changes. Spleen: Normal in size without focal abnormality. Adrenals/Urinary Tract: Adrenal glands  are normal. Kidneys show no hydronephrosis. Low-density lesions in the left kidney cannot be further characterized without contrast. The bladder is unremarkable Stomach/Bowel: The stomach is nonenlarged. Mild fluid distension of the duodenum. Fluid-filled nondistended small bowel in the pelvis without well-defined transition. Small bowel is otherwise decompressed. No convincing bowel wall thickening. Negative appendix. Vascular/Lymphatic: Mild aortic atherosclerosis. No aneurysm. No suspicious lymph nodes. Reproductive: Status post hysterectomy. No adnexal masses. Other: Negative for pelvic effusion or free air. Musculoskeletal: Posterior lumbar fusion hardware L2 through the sacrum with lucency about the L2 and L3 fixating screws. Fixating screws at L2 and L3 extend to the superior endplate on the left. No acute osseous abnormality is seen. IMPRESSION: 1. Heterogeneous consolidations in the left greater than right lung base which may be due to atelectasis, pneumonia or aspiration. 2. Suspect subtle contour nodularity of the liver as may be seen with cirrhosis. Correlate for risk factors 3. Scattered fluid-filled nondilated small and large bowel without definitive point of obstruction. Findings could be secondary to mild ileus or enteritis. No acute bowel wall thickening is seen. Electronically Signed   By: Donavan Foil M.D.   On: 01/15/2022 22:53   CT Head Wo Contrast  Result Date: 01/15/2022 CLINICAL DATA:  Altered mental status EXAM: CT HEAD WITHOUT CONTRAST TECHNIQUE: Contiguous axial images were obtained from the base of the skull through the vertex without intravenous contrast. RADIATION DOSE REDUCTION: This exam was performed according to the departmental dose-optimization program which includes automated exposure control, adjustment of the mA and/or kV according to patient size and/or use of iterative reconstruction technique. COMPARISON:  None Available. FINDINGS: Brain: No acute intracranial findings  on saline noncontrast CT brain. Ventricles are not dilated. Cortical sulci are prominent suggesting atrophy. There is no focal edema or mass effect. Vascular: Unremarkable. Skull: Unremarkable. Sinuses/Orbits: Old blowout fracture is seen in the medial wall of left orbit. There is mucosal thickening in right maxillary sinus. Other: None. IMPRESSION: No acute intracranial findings are seen in noncontrast CT brain. Other findings as described in the body of the report. Electronically Signed   By: Elmer Picker M.D.   On: 01/15/2022 18:18   DG Chest 1 View  Result Date: 01/15/2022 CLINICAL DATA:  Altered mental status.  Hypotension. EXAM: CHEST  1 VIEW COMPARISON:  Radiograph 02/24/2020, chest CT 04/25/2020 FINDINGS: Heart size upper normal. Stable mediastinal contours with aortic atherosclerosis. There are ill-defined opacities in both lung bases and left midlung zone. Background peribronchial thickening likely related to emphysema. No significant pleural effusion or pneumothorax. Surgical hardware in  the cervical spine partially included. IMPRESSION: Ill-defined opacities in both lung bases and left midlung zone, may represent atelectasis versus multifocal infection. Electronically Signed   By: Keith Rake M.D.   On: 01/15/2022 17:32    Microbiology: Results for orders placed or performed during the hospital encounter of 01/15/22  Blood culture (routine x 2)     Status: None (Preliminary result)   Collection Time: 01/15/22  5:20 PM   Specimen: BLOOD LEFT HAND  Result Value Ref Range Status   Specimen Description BLOOD LEFT HAND  Final   Special Requests   Final    BOTTLES DRAWN AEROBIC AND ANAEROBIC Blood Culture adequate volume   Culture   Final    NO GROWTH 3 DAYS Performed at [facility], [place] [address], [place], [place] [address]    Report Status PENDING  Incomplete  SARS Coronavirus 2 by RT PCR (hospital order, performed in [facility] hospital lab) *cepheid single result  test* Anterior Nasal Swab     Status: None   Collection Time: 01/15/22  9:23 PM   Specimen: Anterior Nasal Swab  Result Value Ref Range Status   SARS Coronavirus 2 by RT PCR NEGATIVE NEGATIVE Final    Comment: (NOTE) SARS-CoV-2 target nucleic acids are NOT DETECTED.  The SARS-CoV-2 RNA is generally detectable in upper and lower respiratory specimens during the acute phase of infection. The lowest concentration of SARS-CoV-2 viral copies this assay can detect is 250 copies / mL. A negative result does not preclude SARS-CoV-2 infection and should not be used as the sole basis for treatment or other patient management decisions.  A negative result may occur with improper specimen collection / handling, submission of specimen other than nasopharyngeal swab, presence of viral mutation(s) within the areas targeted by this assay, and inadequate number of viral copies (<250 copies / mL). A negative result must be combined with clinical observations, patient history, and epidemiological information.  Fact Sheet for Patients:   https://www.patel.info/  Fact Sheet for Healthcare Providers: https://hall.com/  This test is not yet approved or  cleared by the [place] FDA and has been authorized for detection and/or diagnosis of SARS-CoV-2 by FDA under an Emergency Use Authorization (EUA).  This EUA will remain in effect (meaning this test can be used) for the duration of the COVID-19 declaration under Section 564(b)(1) of the Act, 21 U.S.C. section 360bbb-3(b)(1), unless the authorization is terminated or revoked sooner.  Performed at [facility], [place] [address], [place], [place] [address]   Blood culture (routine x 2)     Status: None (Preliminary result)   Collection Time: 01/16/22  2:54 AM   Specimen: BLOOD RIGHT HAND  Result Value Ref Range Status   Specimen Description BLOOD RIGHT HAND  Final   Special Requests   Final     AEROBIC BOTTLE ONLY Blood Culture results may not be optimal due to an inadequate volume of blood received in culture bottles   Culture   Final    NO GROWTH 2 DAYS Performed at [facility], [place] [address], [place], [place] [address]    Report Status PENDING  Incomplete  Urine Culture     Status: Abnormal   Collection Time: 01/16/22  5:12 PM   Specimen: In/Out Cath Urine  Result Value Ref Range Status   Specimen Description IN/OUT CATH URINE  Final   Special Requests   Final    NONE Performed at [facility], [place]  [address], [place], [place] [address]    Culture MULTIPLE SPECIES PRESENT, SUGGEST RECOLLECTION (A)  Final   Report Status 01/18/2022 FINAL  Final    Labs: CBC: Recent Labs  Lab 01/15/22 1720 01/15/22 2019 01/16/22 0254 01/17/22 1050 01/17/22 1329  WBC 15.3*  --  13.0* 7.7 8.2  NEUTROABS 10.4*  --   --   --   --   HGB 10.6* 10.5*  10.9* 10.0* 10.0* 9.7*  HCT 32.3* 31.0*  32.0* 31.2* 29.5* 28.5*  MCV 76.4*  --  78.2* 74.1* 74.6*  PLT 322  --  267 242 366   Basic Metabolic Panel: Recent Labs  Lab 01/15/22 1720 01/15/22 2019 01/15/22 2204 01/16/22 0254 01/17/22 1329 01/18/22 0145  NA 131* 130*  129* 130* 132* 137  137 139  K 5.2* 5.0  5.0 5.3* 4.5 3.6  3.5 3.4*  CL 94* 97* 97* 102 108  108 108  CO2 18*  --  17* 17* 20*  21* 21*  GLUCOSE 103* 112* 110* 93 113*  113* 119*  BUN 96* 113* 95* 92* 55*  56* 37*  CREATININE 7.81* 8.30* 7.08* 6.44* 1.73*  1.78* 1.09*  CALCIUM 8.7*  --  8.2* 8.0* 8.5*  8.5* 8.9  MG 1.6*  --   --   --  1.1* 1.7  PHOS  --   --   --   --  2.4*  2.4*  --    Liver Function Tests: Recent Labs  Lab 01/15/22 1720 01/17/22 1329  AST 146*  --   ALT 56*  --   ALKPHOS 84  --   BILITOT 0.5  --   PROT 6.9  --   ALBUMIN 3.3* 2.9*   CBG: Recent Labs  Lab 01/17/22 0814 01/17/22 1204 01/17/22 1637 01/17/22 2155 01/18/22 0920  GLUCAP 150* 123* 113* 149* 166*    Discharge time spent: greater than 30  minutes.  Signed: Estill Cotta, MD [facility] 01/18/2022

## 2022-01-18 NOTE — Progress Notes (Signed)
Explained discharge instructions to patient. Reviewed follow up appointments and next medication administration times. Patient verbalized having an understanding of all discharge instructions. IV was removed by patient's staff RN. All belongings are in the patient's possession. No additional needs verbalized.

## 2022-01-18 NOTE — TOC Transition Note (Addendum)
Transition of Care [facility]) - CM/SW Discharge Note   Patient Details  Name: Brittany Carroll MRN: [id number] Date of Birth: [birth year]  Transition of Care [facility]) CM/SW Contact:  Epifanio Lesches, RN Phone Number: 01/18/2022, 9:41 AM   Clinical Narrative:    Patient will DC to: Home Anticipated DC date: 01/18/2022 Family notified: yes Transport by: car   Per MD patient ready for DC today . RN, patient, patient's family, and [facility] notified of DC. Pt states granddaughter to assist with care once d/c. Pt without DME needs.  Post hospital f/u noted on AVS. Pt without Rx med concerns. Granddaughter to provide transportation to home.  RNCM will sign off for now as intervention is no longer needed. Please consult [place] again if new needs arise.    Final next level of care: Home w Home Health Services Barriers to Discharge: No Barriers Identified   Patient Goals and CMS Choice     Choice offered to / list presented to : Patient  Discharge Placement                       Discharge Plan and Services   Discharge Planning Services: CM Consult                                 Social Determinants of Health (SDOH) Interventions     Readmission Risk Interventions     No data to display

## 2022-01-20 LAB — CULTURE, BLOOD (ROUTINE X 2)
Culture: NO GROWTH
Special Requests: ADEQUATE

## 2022-01-21 LAB — CULTURE, BLOOD (ROUTINE X 2): Culture: NO GROWTH

## 2022-01-26 ENCOUNTER — Telehealth: Payer: Self-pay | Admitting: Cardiology

## 2022-01-26 DIAGNOSIS — I10 Essential (primary) hypertension: Secondary | ICD-10-CM

## 2022-01-26 MED ORDER — METOPROLOL SUCCINATE ER 50 MG PO TB24: 50.0000 mg | ORAL_TABLET | Freq: Every evening | ORAL | 3 refills | Status: AC

## 2022-01-26 NOTE — Telephone Encounter (Signed)
Patient recently discharged from the hospital, metoprolol succinate was not in the discharge summary as taking and also not taking, patient was hypertensive upon discharge.  I reviewed her chart, she should be on metoprolol succinate 50 mg daily and hence I will refill this.  I spent 10 minutes reviewing the chart and direct discussion with Maralyn Sago the pharmacist at  [facility] [place] [place] - [address] [facility].  [address] [place] [address]     ICD-10-CM   1. Primary hypertension  I10 metoprolol succinate (TOPROL-XL) 50 MG 24 hr tablet     Medications Discontinued During This Encounter  Medication Reason   metoprolol succinate (TOPROL-XL) 50 MG 24 hr tablet     Meds ordered this encounter  Medications   metoprolol succinate (TOPROL-XL) 50 MG 24 hr tablet    Sig: Take 1 tablet (50 mg total) by mouth every evening. Take with or immediately following a meal.    Dispense:  90 tablet    Refill:  3

## 2022-05-01 ENCOUNTER — Other Ambulatory Visit: Payer: Self-pay | Admitting: Family Medicine

## 2022-05-01 DIAGNOSIS — E2839 Other primary ovarian failure: Secondary | ICD-10-CM

## 2022-05-05 ENCOUNTER — Encounter (HOSPITAL_COMMUNITY): Payer: Self-pay | Admitting: Emergency Medicine

## 2022-05-05 ENCOUNTER — Emergency Department (HOSPITAL_COMMUNITY)
Admission: EM | Admit: 2022-05-05 | Discharge: 2022-05-05 | Disposition: A | Discharge status: Home / Self Care | Payer: Medicare HMO | Attending: Emergency Medicine | Admitting: Emergency Medicine

## 2022-05-05 ENCOUNTER — Other Ambulatory Visit: Payer: Self-pay

## 2022-05-05 DIAGNOSIS — M5441 Lumbago with sciatica, right side: Secondary | ICD-10-CM | POA: Diagnosis present

## 2022-05-05 DIAGNOSIS — Z794 Long term (current) use of insulin: Secondary | ICD-10-CM | POA: Diagnosis not present

## 2022-05-05 DIAGNOSIS — I10 Essential (primary) hypertension: Secondary | ICD-10-CM | POA: Diagnosis not present

## 2022-05-05 DIAGNOSIS — E119 Type 2 diabetes mellitus without complications: Secondary | ICD-10-CM | POA: Insufficient documentation

## 2022-05-05 DIAGNOSIS — G8929 Other chronic pain: Secondary | ICD-10-CM

## 2022-05-05 MED ORDER — HYDROMORPHONE HCL 1 MG/ML IJ SOLN
0.5000 mg | Freq: Once | INTRAMUSCULAR | Status: AC
  Administered 2022-05-05: 0.5 mg via SUBCUTANEOUS
  Filled 2022-05-05: qty 1

## 2022-05-05 MED ORDER — HYDROMORPHONE HCL 1 MG/ML IJ SOLN: 0.5000 mg | Freq: Once | INTRAMUSCULAR | Status: DC

## 2022-05-05 NOTE — Discharge Instructions (Signed)
Note the visit to the emergency department today was overall reassuring.  As discussed, follow-up with your neurosurgeon is of utmost importance given your current medical situation.  I would call them at your earliest convenience to see if your appointment can be moved up.  Please not hesitate to return to emergency department if the worrisome signs and symptoms we discussed become apparent.

## 2022-05-05 NOTE — ED Provider Triage Note (Addendum)
Emergency Medicine Provider Triage Evaluation Note  Brittany Carroll , a 66 y.o. female  was evaluated in triage.  Pt complains of acute on chronic low back pain. States lumbar fusion 1 year ago, has a screw that is out of place and pinching causing pain. Has run out of her oxy and isnt scheduled to see her surgeon again until 12/19. No falls or injuries. No acute complaints.  Filled #130 20mg  oxy on 04/20/22 Review of Systems  Positive: As above Negative: As above  Physical Exam  BP (!) 135/50   Pulse 91   Temp 98.6 F (37 C) (Oral)   Resp 16   Ht 5\' 7"  (1.702 m)   Wt 92.1 kg   SpO2 99%   BMI 31.79 kg/m  Gen:   Awake, no distress   Resp:  Normal effort  MSK:   Moves extremities without difficulty  Other:    Medical Decision Making  Medically screening exam initiated at 5:40 PM.  Appropriate orders placed.  Farron Huxford was informed that the remainder of the evaluation will be completed by another provider, this initial triage assessment does not replace that evaluation, and the importance of remaining in the ED until their evaluation is complete.     , PA-C 05/05/22 1739    Jeannie Fend, PA-C 05/05/22 1740

## 2022-05-05 NOTE — ED Triage Notes (Signed)
Pt c/o back pain that radiates down to R knee. Pt states she had a lumbar fusion last year but a screw came out 4 months ago and the pain has worsened.

## 2022-05-05 NOTE — ED Provider Notes (Signed)
[facility] EMERGENCY DEPARTMENT Provider Note   CSN: [id number] Arrival date & time: 05/05/22  1709     History  Chief Complaint  Patient presents with   Back Pain    Brittany Carroll is a 66 y.o. female.   Back Pain   66 year old female presents emergency department with complaints of right-sided lumbar back pain with radiation down her right leg.  Patient with history of revision of lumbar fusion performed on 6/22.  Patient with pain since prior surgery.  Patient reports pain beginning in the right lumbar region with radiation down right buttock into posterior right knee.  Denies weakness/sensory deficits in bilateral legs, saddle anesthesia, bowel/bladder dysfunction, history of IV drug use, fever.  Patient reports pain is worsened with movement and is relieved with rest.  Patient states has been taking at home oxycodone 20 mg and reports being "out of medication."  She has follow-up with neurosurgeon on the 19th of this month and is requesting medication to help with the pain.  Patient reports prior imaging showing a screw "out of place" from prior imaging approximately 3 months ago.  Denies any recent falls/traumas.  Past medical history significant for hypertension, hyperlipidemia, fibromyalgia, diabetes mellitus, lumbar fusion,  Home Medications Prior to Admission medications   Medication Sig Start Date End Date Taking? Authorizing Provider  amitriptyline (ELAVIL) 75 MG tablet Take 75 mg by mouth at bedtime. 11/15/20   [provider]  aspirin EC 325 MG tablet Take 325 mg by mouth daily.    [provider]  buPROPion (WELLBUTRIN XL) 150 MG 24 hr tablet Take 150 mg by mouth daily.    [provider]  cholecalciferol (VITAMIN D3) 25 MCG (1000 UT) tablet Take 1,000 Units by mouth daily.    [provider]  cloNIDine (CATAPRES - DOSED IN MG/24 HR) 0.2 mg/24hr patch Place 1 patch (0.2 mg total) onto the skin every 7 (seven) days.  10/03/21 10/03/22  Yates Decamp, MD  esomeprazole (NEXIUM) 40 MG capsule Take 40 mg by mouth daily as needed (acid reflux).    [provider]  HUMALOG KWIKPEN 200 UNIT/ML KwikPen Inject 5 Units into the skin 3 (three) times daily before meals. 06/29/21   [provider]  hydrALAZINE (APRESOLINE) 50 MG tablet Take 1 tablet (50 mg total) by mouth 3 (three) times daily. 06/19/21 06/14/22  Cantwell, Celeste C, PA-C  hydrocortisone (PROCTOZONE-HC) 2.5 % rectal cream Place 1 Application rectally 2 (two) times daily as needed for hemorrhoids or anal itching.    [provider]  insulin glargine (LANTUS SOLOSTAR) 100 UNIT/ML Solostar Pen Inject 12 Units into the skin at bedtime.    [provider]  JENTADUETO 2.09-998 MG TABS Take 1 tablet by mouth 2 (two) times daily. 11/09/20   [provider]  losartan (COZAAR) 100 MG tablet Take 1 tablet (100 mg total) by mouth daily. 01/20/22   Rai, Ripudeep K, MD  magnesium oxide (MAG-OX) 400 MG tablet Take 400 mg by mouth daily.    [provider]  methimazole (TAPAZOLE) 10 MG tablet Take 15 mg by mouth daily. Taking  1 1/2 tabs = 15 mg daily    [provider]  metoprolol succinate (TOPROL-XL) 50 MG 24 hr tablet Take 1 tablet (50 mg total) by mouth every evening. Take with or immediately following a meal. 01/26/22 01/21/23  Yates Decamp, MD  Multiple Vitamin (MULTIVITAMIN WITH MINERALS) TABS tablet Take 1 tablet by mouth daily.    [provider]  naloxone (NARCAN) nasal spray 4 mg/0.1 mL Place 1 spray into the nose as directed. 12/29/21   [provider]  Oxycodone HCl 20 MG TABS Take 20 mg by mouth 4 (four) times daily. 05/30/19   [provider]  pregabalin (LYRICA) 200 MG capsule Take 200 mg by mouth 2 (two) times daily. 01/05/21   [provider]  rosuvastatin (CRESTOR) 10 MG tablet Take 1 tablet by mouth at bedtime.    [provider]      Allergies    Lisinopril and  Metformin and related    Review of Systems   Review of Systems  Musculoskeletal:  Positive for back pain.  All other systems reviewed and are negative.   Physical Exam Updated Vital Signs BP (!) 135/50   Pulse 91   Temp 98.6 F (37 C) (Oral)   Resp 16   Ht 5\' 7"  (1.702 m)   Wt 92.1 kg   SpO2 99%   BMI 31.79 kg/m  Physical Exam Vitals and nursing note reviewed.  Constitutional:      General: She is not in acute distress.    Appearance: She is well-developed.  HENT:     Head: Normocephalic and atraumatic.  Eyes:     Conjunctiva/sclera: Conjunctivae normal.  Cardiovascular:     Rate and Rhythm: Normal rate and regular rhythm.     Heart sounds: No murmur heard. Pulmonary:     Effort: Pulmonary effort is normal. No respiratory distress.     Breath sounds: Normal breath sounds.  Abdominal:     Palpations: Abdomen is soft.     Tenderness: There is no abdominal tenderness.  Musculoskeletal:        General: No swelling.     Cervical back: Neck supple.     Comments: Surgical scar in the lumbar region without surrounding erythema, induration, palpable fluctuance.  Patient tender to palpation in right paraspinal region of the lumbar area.  Patient with symmetric strength in hip flexion, extension, knee flexion, extension, ankle dorsi/plantarflexion bilaterally.  DTRs symmetric and equal bilateral lower extremities.  Pedal pulses full intact bilaterally.  No tender to palpation midline cervical, thoracic, lumbar spine with no obvious step-off or deformity noted.  Skin:    General: Skin is warm and dry.     Capillary Refill: Capillary refill takes less than 2 seconds.  Neurological:     Mental Status: She is alert.  Psychiatric:        Mood and Affect: Mood normal.     ED Results / Procedures / Treatments   Labs (all labs ordered are listed, but only abnormal results are displayed) Labs Reviewed - No data to display  EKG None  Radiology No results  found.  Procedures Procedures    Medications Ordered in ED Medications  HYDROmorphone (DILAUDID) injection 0.5 mg (0.5 mg Subcutaneous Given 05/05/22 1844)    ED Course/ Medical Decision Making/ A&P                           Medical Decision Making Risk Prescription drug management.   This patient presents to the ED for concern of back pain, this involves an extensive number of treatment options, and is a complaint that carries with it a high risk of complications and morbidity.  The differential diagnosis includes cauda equina, spinal epidural abscess, strain/sprain, dislocation, fracture, hardware malfunction   Co morbidities that complicate the patient evaluation  See HPI  Additional history obtained:  Additional history obtained from EMR External records from outside source obtained and reviewed including hospital records   Lab Tests:  N/a   Imaging Studies ordered:  N/a   Cardiac Monitoring: / EKG:  The patient was maintained on a cardiac monitor.  I personally viewed and interpreted the cardiac monitored which showed an underlying rhythm of: Sinus rhythm   Consultations Obtained:  N/a   Problem List / ED Course / Critical interventions / Medication management  Back pain I ordered medication including Dilaudid for pain   Reevaluation of the patient after these medicines showed that the patient improved I have reviewed the patients home medicines and have made adjustments as needed   Social Determinants of Health:  Reports some tobacco use.  Denies, licit drug use.  Patient on chronic opiate medication for back pain.   Test / Admission - Considered:  Back pain Vitals signs significant for mild hypertension with a blood pressure 135/80.  Recommend follow-up with primary care regarding elevated blood pressure.. Otherwise within normal range and stable throughout visit. Discussed at length with patient regarding lack of red flag signs of current  presentation.  Offered repeat imaging studies but patient inclined at this time due to imaging approximately 3 months ago with no repeat falls/traumas.  Patient given dose of pain medication while emergency department.  Patient had recent 130 20 mg oxycodone filled on 04/16/2022.  Patient states she is out of this medication when asked directly.  I discussed with patient regarding inability to continue prescription given most recent fill.  Other treatment options included lidocaine, NSAIDs, Tylenol, prednisone taper.  Patient states that she has tried prednisone and "it just drives my sugar up" and she did not find much relief from it.  Patient states she has allergy to NSAIDs that she breaks out in hives.  Patient also states that she has not found much relief from lidocaine patches.  She currently takes pregabalin along with her oxycodone.  Patient recommended follow-up with neurosurgery as soon as she is able for repeat evaluation and possible extending prescription of oxycodone. Treatment plan discussed length with patient she denies understanding was agreeable to said plan. Worrisome signs and symptoms were discussed with the patient, and the patient acknowledged understanding to return to the ED if noticed. Patient was stable upon discharge.          Final Clinical Impression(s) / ED Diagnoses Final diagnoses:  Chronic right-sided low back pain with right-sided sciatica    Rx / DC Orders ED Discharge Orders     None         Wilnette Kales, [place] 05/05/22 Donia Ast, MD 05/05/22 2310

## 2022-05-11 ENCOUNTER — Ambulatory Visit
Admission: RE | Admit: 2022-05-11 | Discharge: 2022-05-11 | Disposition: A | Discharge status: Home / Self Care | Payer: Medicare HMO | Source: Ambulatory Visit | Attending: Family Medicine | Admitting: Family Medicine

## 2022-05-11 ENCOUNTER — Other Ambulatory Visit: Payer: Self-pay | Admitting: Family Medicine

## 2022-05-11 DIAGNOSIS — M25551 Pain in right hip: Secondary | ICD-10-CM

## 2022-05-11 DIAGNOSIS — R0789 Other chest pain: Secondary | ICD-10-CM

## 2022-05-23 ENCOUNTER — Other Ambulatory Visit: Payer: Self-pay

## 2022-05-23 MED ORDER — HYDRALAZINE HCL 50 MG PO TABS: 50.0000 mg | ORAL_TABLET | Freq: Three times a day (TID) | ORAL | 3 refills | Status: DC

## 2022-06-11 ENCOUNTER — Ambulatory Visit: Payer: 59 | Admitting: Cardiology

## 2022-06-11 NOTE — Progress Notes (Deleted)
Primary Physician/Referring:  [name], MD  Patient ID: Brittany Carroll, female    DOB: 1955-08-23, 67 y.o.   MRN: [id number]  No chief complaint on file.  HPI:    Brittany Carroll  is a 67 y.o. Female patient with history of atrial fibrillation S/P Cardioversion in 2008 without recurrence, hypertension, mixed hyperlipidemia, diabetes mellitus, tobacco use, hyperthyroidism previously treated with methimazole, fibromyalgia and chronic pain.  Her last hospitalization was in August 2023 due to sepsis, pneumonia and acute renal failure.  She has had difficult to control hypertension mostly related to her diet and obesity.  Renal angiography excluded renal artery stenosis.  Past Medical History:  Diagnosis Date   Cataract    OU   Diabetes mellitus without complication ([place])    Fibromyalgia    History of atrial fibrillation without current medication    2008 in [place] had cardioversion   Hyperlipidemia    Hypertension    Macular degeneration    Past Surgical History:  Procedure Laterality Date   CERVICAL FUSION     ECTOPIC PREGNANCY SURGERY     LUMBAR FUSION     PARTIAL HYSTERECTOMY     RENAL ANGIOGRAPHY N/A 10/03/2021   Procedure: RENAL ANGIOGRAPHY;  Surgeon: Adrian Prows, MD;  Location: [facility];  Service: Cardiovascular;  Laterality: N/A;   Family History  Problem Relation Age of Onset   Atrial fibrillation Mother    Hypertension Mother    Hyperlipidemia Mother    Leukemia Father    Diabetes Brother    Hypertension Brother    Hyperlipidemia Brother    Diabetes Brother    Hyperlipidemia Brother    Hypertension Brother    Social History   Tobacco Use   Smoking status: Some Days    Packs/day: 0.50    Years: 35.00    Total pack years: 17.50    Types: Cigarettes    Last attempt to quit: 12/13/2020    Years since quitting: 1.4   Smokeless tobacco: Never   Tobacco comments:    Started 2 days ago again, and trying to wing herself off again.   Substance Use  Topics   Alcohol use: Not Currently    Comment: occasional   ROS  Review of Systems  Cardiovascular:  Negative for chest pain, dyspnea on exertion and leg swelling.   Objective  There were no vitals taken for this visit.     05/05/2022    6:50 PM 05/05/2022    5:22 PM 05/05/2022    5:18 PM  Vitals with BMI  Height  5' 7"$  5' 7"$   Weight  203 lbs   BMI  99991111   Systolic [id number]    Diastolic 80    Pulse 75       Physical Exam Constitutional:      Appearance: She is obese.  Neck:     Vascular: No carotid bruit or JVD.  Cardiovascular:     Rate and Rhythm: Normal rate and regular rhythm.     Pulses: Intact distal pulses.     Heart sounds: Normal heart sounds. No murmur heard.    No gallop.  Pulmonary:     Effort: Pulmonary effort is normal.     Breath sounds: Normal breath sounds.  Abdominal:     General: Bowel sounds are normal.     Palpations: Abdomen is soft.  Musculoskeletal:     Right lower leg: No edema.     Left lower leg: No edema.  Laboratory examination:   Lab Results  Component Value Date   NA 139 01/18/2022   K 3.4 (L) 01/18/2022   CO2 21 (L) 01/18/2022   GLUCOSE 119 (H) 01/18/2022   BUN 37 (H) 01/18/2022   CREATININE 1.09 (H) 01/18/2022   CALCIUM 8.9 01/18/2022   EGFR 84 09/27/2021   GFRNONAA 56 (L) 01/18/2022       Latest Ref Rng & Units 01/18/2022    1:45 AM 01/17/2022    1:29 PM 01/16/2022    2:54 AM  CMP  Glucose 70 - 99 mg/dL 119  113    113  93   BUN 8 - 23 mg/dL 37  56    55  92   Creatinine 0.44 - 1.00 mg/dL 1.09  1.78    1.73  6.44   Sodium 135 - 145 mmol/L 139  137    137  132   Potassium 3.5 - 5.1 mmol/L 3.4  3.5    3.6  4.5   Chloride 98 - 111 mmol/L 108  108    108  102   CO2 22 - 32 mmol/L 21  21    20  17   $ Calcium 8.9 - 10.3 mg/dL 8.9  8.5    8.5  8.0       Latest Ref Rng & Units 01/17/2022    1:29 PM 01/17/2022   10:50 AM 01/16/2022    2:54 AM  CBC  WBC 4.0 - 10.5 K/uL 8.2  7.7  13.0   Hemoglobin 12.0 - 15.0 g/dL 9.7   10.0  10.0   Hematocrit 36.0 - 46.0 % 28.5  29.5  31.2   Platelets 150 - 400 K/uL 244  242  267    Lipid Panel     Component Value Date/Time   CHOL 152 08/14/2021 1210   TRIG 142 08/14/2021 1210   HDL 49 08/14/2021 1210   LDLCALC 78 08/14/2021 1210   HEMOGLOBIN A1C Lab Results  Component Value Date   HGBA1C 6.6 (H) 01/16/2022   MPG 142.72 01/16/2022   TSH Recent Labs    01/15/22 1720  TSH 0.299*  Free T4 normal at 0.78.  External labs: 11/09/2020: Hemoglobin 8.8, hematocrit 25.8, MCV 76.5, platelet 197 Magnesium 2.0 A1c 7.0% Sodium 138, potassium 4.1, BUN 19, creatinine 0.68, GFR >90  05/08/2019: Serum glucose 157 mg, BUN 11, creatinine 0.8, eGFR >72 mL.  Potassium 4.8.  CMP normal.  CBC normal with minimal microcytic index.  Total cholesterol 188, triglycerides 172, HDL 57, LDL 97.  Non-HDL cholesterol 131.  TSH normal.  Vitamin D mildly reduced at 28.5.  A1c 6.1%.    Allergies   Allergies  Allergen Reactions   Lisinopril Hives   Metformin And Related Hives    Medications Prior to Visit:   Outpatient Medications Prior to Visit  Medication Sig Dispense Refill   amitriptyline (ELAVIL) 75 MG tablet Take 75 mg by mouth at bedtime.     aspirin EC 325 MG tablet Take 325 mg by mouth daily.     buPROPion (WELLBUTRIN XL) 150 MG 24 hr tablet Take 150 mg by mouth daily.     cholecalciferol (VITAMIN D3) 25 MCG (1000 UT) tablet Take 1,000 Units by mouth daily.     cloNIDine (CATAPRES - DOSED IN MG/24 HR) 0.2 mg/24hr patch Place 1 patch (0.2 mg total) onto the skin every 7 (seven) days. 4 patch 3   esomeprazole (NEXIUM) 40 MG capsule Take 40 mg by mouth daily as  needed (acid reflux).     HUMALOG KWIKPEN 200 UNIT/ML KwikPen Inject 5 Units into the skin 3 (three) times daily before meals.     hydrALAZINE (APRESOLINE) 50 MG tablet Take 1 tablet (50 mg total) by mouth 3 (three) times daily. 270 tablet 3   hydrocortisone (PROCTOZONE-HC) 2.5 % rectal cream Place 1 Application  rectally 2 (two) times daily as needed for hemorrhoids or anal itching.     insulin glargine (LANTUS SOLOSTAR) 100 UNIT/ML Solostar Pen Inject 12 Units into the skin at bedtime.     JENTADUETO 2.09-998 MG TABS Take 1 tablet by mouth 2 (two) times daily.     losartan (COZAAR) 100 MG tablet Take 1 tablet (100 mg total) by mouth daily. 90 tablet 0   magnesium oxide (MAG-OX) 400 MG tablet Take 400 mg by mouth daily.     methimazole (TAPAZOLE) 10 MG tablet Take 15 mg by mouth daily. Taking  1 1/2 tabs = 15 mg daily     metoprolol succinate (TOPROL-XL) 50 MG 24 hr tablet Take 1 tablet (50 mg total) by mouth every evening. Take with or immediately following a meal. 90 tablet 3   Multiple Vitamin (MULTIVITAMIN WITH MINERALS) TABS tablet Take 1 tablet by mouth daily.     naloxone (NARCAN) nasal spray 4 mg/0.1 mL Place 1 spray into the nose as directed.     Oxycodone HCl 20 MG TABS Take 20 mg by mouth 4 (four) times daily.     pregabalin (LYRICA) 200 MG capsule Take 200 mg by mouth 2 (two) times daily.     rosuvastatin (CRESTOR) 10 MG tablet Take 1 tablet by mouth at bedtime.     No facility-administered medications prior to visit.   Final Medications at End of Visit    No outpatient medications have been marked as taking for the 06/11/22 encounter (Appointment) with Adrian Prows, MD.   Radiology:  No results found.  Cardiac Studies:  Abdominal aortogram and selective right and left renal arteriogram 10/03/2021: Abdominal gram debridements of 2 renal arteries 1 on either sides.  There was no significant abdominal aortic atherosclerosis or luminal irregularity.  Solitary left renal arteriogram revealed widely patent renal arteries.  Radial artery has an anterior origin from the anterior wall of the aorta.  Suspect false positive Dopplers due to tortuosity.  Recommendation: [name] blood pressure is related to poor diet and obesity.  I will start the patient on Catapres patch.  She will need continued  medical therapy.  Renal artery duplex  08/04/2021:  Hemodynamically significant stenosis of the right renal artery.  No evidence of hemodynamically significant stenosis left kidney.  Renal length is within normal limits for both kidneys. 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  Two simple renal cysts (2cmx2 cm approx) seen in the right kidney. .  Mild diffuse atherosclerosis of the abdominal aorta.   Echocardiogram 03/23/2020:  1. Left ventricular ejection fraction, by estimation, is 60 to 65%. The  left ventricle has normal function. The left ventricle has no regional  wall motion abnormalities. There is mild left ventricular hypertrophy.  Left ventricular diastolic parameters  are consistent with Grade II diastolic dysfunction (pseudonormalization).   2. Right ventricular systolic function is normal. The right ventricular  size is normal.   3. The mitral valve is normal in structure. Trivial mitral valve  regurgitation. No evidence of mitral stenosis.   4. The aortic valve is normal in structure. Aortic valve regurgitation is  not  visualized. No aortic stenosis is present.   5. The inferior vena cava is normal in size with greater than 50%  respiratory variability, suggesting right atrial pressure of 3 mmHg.   EKG  09/18/2021: Sinus rhythm at a rate of 71 bpm with borderline first-degree AV block.  Normal axis.  Poor reflection, cannot exclude anteroseptal infarct old.  No evidence of ischemia or underlying injury pattern.   01/17/2021: Sinus rhythm at a rate of 77 bpm with borderline first-degree AV block.  Left atrial enlargement.  Normal axis.  Poor R wave progression, cannot exclude anteroseptal infarct old.  IVCD.  PCP EKG 01/13/2021: Sinus tachycardia with single PAC at a rate of 109 bpm.  Normal axis.  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  Left atrial enlargement.  06/03/2020: Sinus tachycardia at rate of 103 bpm, left atrial enlargement, otherwise  normal EKG.   EKG 06/05/2019: Normal sinus rhythm at rate of 79 bpm, normal axis.  Poor R-wave progression, cannot exclude anteroseptal infarct old, probably normal variant.  No evidence of ischemia,  Single PAC.   Assessment   No diagnosis found.     No orders of the defined types were placed in this encounter.  There are no discontinued medications.   This patients CHA2DS2-VASc Score 4 (HTN, DM, Age, F) and yearly risk of stroke 4.8%.   Recommendations:    Vaneza Tiemeyer  is a 67 y.o. Female patient with history of atrial fibrillation S/P Cardioversion in 2008 without recurrence, hypertension, mixed hyperlipidemia, diabetes mellitus, tobacco use, hyperthyroidism previously treated with methimazole, fibromyalgia and chronic pain.  Patient was seen in 2021 for consultation regarding remote atrial fibrillation.   Patient was last seen in our office 09/18/2021.  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  Given these findings increase rosuvastatin to 20 mg daily and shared decision was to proceed with renal angiography.  Patient underwent renal angiography 10/03/2021 which revealed patent renal arteries with no significant abdominal aortic atherosclerosis.  Conclusion of the study was that Dopplers were falsely positive likely due to tortuosity in the patient's difficult to control blood pressure is related to poor diet and obesity.  Patient was therefore started on Catapres patch.  Patient was last seen in the office 10/11/2021 at which time discussed at length regarding diet and lifestyle modifications including DASH diet and weight loss to improve hypertension control.  She at that time was also not using clonidine patches which were previously prescribed.  Patient is enrolled in remote patient monitoring.  She presents for 8-week follow-up of hypertension. I reviewed remote monitoring data, average home blood pressure reading 136/87 mmHg.   Although blood pressure is uncontrolled in the office today it is well controlled on home monitoring.  She reports improved dietary compliance and is now using clonidine patches.  Will not make changes to medications at this time.  Suspect patient has a component of whitecoat hypertension.  Notably patient is working to quit smoking, currently smoking 2 cigarettes/day, she is encouraged carding complete smoking cessation.  Continue remote patient monitoring.  Follow-up in 6 months, sooner if needed.   Adrian Prows, PA-C 06/11/2022, 1:09 PM Office: [phone]

## 2022-06-13 ENCOUNTER — Ambulatory Visit: Payer: 59 | Admitting: Cardiology

## 2022-06-20 ENCOUNTER — Ambulatory Visit: Payer: 59 | Admitting: Cardiology

## 2022-06-21 ENCOUNTER — Other Ambulatory Visit: Payer: Medicare Other

## 2022-06-26 ENCOUNTER — Ambulatory Visit: Payer: 59 | Admitting: Cardiology

## 2022-06-26 ENCOUNTER — Encounter: Payer: Self-pay | Admitting: Cardiology

## 2022-06-26 VITALS — BP 166/82 | HR 69 | Resp 17 | Ht 67.0 in | Wt 216.0 lb

## 2022-06-26 DIAGNOSIS — Z01810 Encounter for preprocedural cardiovascular examination: Secondary | ICD-10-CM

## 2022-06-26 DIAGNOSIS — E782 Mixed hyperlipidemia: Secondary | ICD-10-CM

## 2022-06-26 DIAGNOSIS — I491 Atrial premature depolarization: Secondary | ICD-10-CM

## 2022-06-26 DIAGNOSIS — I700 Atherosclerosis of aorta: Secondary | ICD-10-CM

## 2022-06-26 DIAGNOSIS — I10 Essential (primary) hypertension: Secondary | ICD-10-CM

## 2022-06-26 MED ORDER — ASPIRIN 81 MG PO CHEW: 81.0000 mg | CHEWABLE_TABLET | Freq: Every day | ORAL | Status: AC

## 2022-06-26 MED ORDER — CLONIDINE HCL 0.2 MG PO TABS: 0.2000 mg | ORAL_TABLET | Freq: Two times a day (BID) | ORAL | 2 refills | Status: DC

## 2022-06-26 NOTE — Progress Notes (Signed)
Primary Physician/Referring:  [name], MD  Patient ID: Brittany Carroll, female    DOB: [birth year], 67 y.o.   MRN: [id number]  Chief Complaint  Patient presents with   Hypertension   Atrial Fibrillation   Follow-up    6 month    HPI:    Brittany Carroll  is a 67 y.o. Female patient with history of atrial fibrillation S/P Cardioversion in 2008 without recurrence, hypertension, mixed hyperlipidemia, diabetes mellitus, tobacco use, hyperthyroidism treated with methimazole, fibromyalgia and chronic pain.  She has had some change, this is.  And she is now again scheduled for back surgery.  She is in extreme pain today in the office.  She is presently doing well except for chronic dyspnea but more importantly she has been in severe back pain and needs redo back surgery.  She presents here for a 58-month office visit.  Due to pain she has started to smoke again.  Denies chest pain.   Past Medical History:  Diagnosis Date   Cataract    OU   Diabetes mellitus without complication ([place])    Fibromyalgia    History of atrial fibrillation without current medication    2008 in [place] had cardioversion   Hyperlipidemia    Hypertension    Macular degeneration    Past Surgical History:  Procedure Laterality Date   CERVICAL FUSION     ECTOPIC PREGNANCY SURGERY     LUMBAR FUSION     PARTIAL HYSTERECTOMY     RENAL ANGIOGRAPHY N/A 10/03/2021   Procedure: RENAL ANGIOGRAPHY;  Surgeon: Adrian Prows, MD;  Location: [facility];  Service: Cardiovascular;  Laterality: N/A;   Family History  Problem Relation Age of Onset   Atrial fibrillation Mother    Hypertension Mother    Hyperlipidemia Mother    Leukemia Father    Diabetes Brother    Hypertension Brother    Hyperlipidemia Brother    Diabetes Brother    Hyperlipidemia Brother    Hypertension Brother    Social History   Tobacco Use   Smoking status: Some Days    Packs/day: 0.50    Years: 35.00    Total pack years: 17.50    Types:  Cigarettes   Smokeless tobacco: Never   Tobacco comments:    Started skmokeing 12/23 again, and trying to wing herself off again. Patient stopped smoking in 11/2020  Substance Use Topics   Alcohol use: Not Currently    Comment: occasional   ROS  Review of Systems  Cardiovascular:  Positive for dyspnea on exertion. Negative for chest pain and leg swelling.  Musculoskeletal:  Positive for back pain.    Objective  Blood pressure (!) 166/82, pulse 69, resp. rate 17, height 5\' 7"  (1.702 m), weight 216 lb (98 kg), SpO2 97 %.     06/26/2022    1:24 PM 05/05/2022    6:50 PM 05/05/2022    5:22 PM  Vitals with BMI  Height 5\' 7"   5\' 7"   Weight 216 lbs  203 lbs  BMI 09.32  67.12  Systolic 458 099   Diastolic 82 80   Pulse 69 75      Physical Exam Neck:     Vascular: No carotid bruit or JVD.  Cardiovascular:     Rate and Rhythm: Normal rate and regular rhythm.     Pulses: Intact distal pulses.     Heart sounds: Murmur heard.     Midsystolic murmur is present with a grade  of 2/6 at the upper right sternal border.     No gallop.  Pulmonary:     Effort: Pulmonary effort is normal.     Breath sounds: Normal breath sounds.  Abdominal:     General: Bowel sounds are normal.     Palpations: Abdomen is soft.  Musculoskeletal:     Right lower leg: No edema.     Left lower leg: No edema.     Laboratory examination:      Latest Ref Rng & Units 01/18/2022    1:45 AM 01/17/2022    1:29 PM 01/16/2022    2:54 AM  CMP  Glucose 70 - 99 mg/dL 119  113    113  93   BUN 8 - 23 mg/dL 37  56    55  92   Creatinine 0.44 - 1.00 mg/dL 1.09  1.78    1.73  6.44   Sodium 135 - 145 mmol/L 139  137    137  132   Potassium 3.5 - 5.1 mmol/L 3.4  3.5    3.6  4.5   Chloride 98 - 111 mmol/L 108  108    108  102   CO2 22 - 32 mmol/L 21  21    20  17    Calcium 8.9 - 10.3 mg/dL 8.9  8.5    8.5  8.0       Latest Ref Rng & Units 01/17/2022    1:29 PM 01/17/2022   10:50 AM 01/16/2022    2:54 AM  CBC   WBC 4.0 - 10.5 K/uL 8.2  7.7  13.0   Hemoglobin 12.0 - 15.0 g/dL 9.7  10.0  10.0   Hematocrit 36.0 - 46.0 % 28.5  29.5  31.2   Platelets 150 - 400 K/uL 244  242  267    Lipid Panel     Component Value Date/Time   CHOL 152 08/14/2021 1210   TRIG 142 08/14/2021 1210   HDL 49 08/14/2021 1210   LDLCALC 78 08/14/2021 1210   HEMOGLOBIN A1C Lab Results  Component Value Date   HGBA1C 6.6 (H) 01/16/2022   MPG 142.72 01/16/2022   TSH Recent Labs    01/15/22 1720  TSH 0.299*    External labs:  Cholesterol, total 152.000 m 08/14/2021 HDL 49.000 mg 08/14/2021 LDL 78.000 mg 08/14/2021 Triglycerides 142.000 m 08/14/2021  11/09/2020: Hemoglobin 8.8, hematocrit 25.8, MCV 76.5, platelet 197 Magnesium 2.0 A1c 7.0% Sodium 138, potassium 4.1, BUN 19, creatinine 0.68, GFR >90   Allergies   Allergies  Allergen Reactions   Lisinopril Hives   Metformin And Related Hives    Final Medications at End of Visit      Current Outpatient Medications:    amitriptyline (ELAVIL) 75 MG tablet, Take 75 mg by mouth at bedtime., Disp: , Rfl:    aspirin (ASPIRIN CHILDRENS) 81 MG chewable tablet, Chew 1 tablet (81 mg total) by mouth daily., Disp: , Rfl:    buPROPion (WELLBUTRIN XL) 150 MG 24 hr tablet, Take 150 mg by mouth daily., Disp: , Rfl:    cloNIDine (CATAPRES) 0.2 MG tablet, Take 1 tablet (0.2 mg total) by mouth 2 (two) times daily., Disp: 60 tablet, Rfl: 2   esomeprazole (NEXIUM) 40 MG capsule, Take 40 mg by mouth daily as needed (acid reflux)., Disp: , Rfl:    HUMALOG KWIKPEN 200 UNIT/ML KwikPen, Inject 5 Units into the skin 3 (three) times daily before meals., Disp: , Rfl:    hydrALAZINE (APRESOLINE)  50 MG tablet, Take 1 tablet (50 mg total) by mouth 3 (three) times daily., Disp: 270 tablet, Rfl: 3   insulin glargine (LANTUS SOLOSTAR) 100 UNIT/ML Solostar Pen, Inject 12 Units into the skin at bedtime., Disp: , Rfl:    JENTADUETO 2.09-998 MG TABS, Take 1 tablet by mouth 2 (two) times daily.,  Disp: , Rfl:    losartan (COZAAR) 100 MG tablet, Take 1 tablet (100 mg total) by mouth daily., Disp: 90 tablet, Rfl: 0   magnesium oxide (MAG-OX) 400 MG tablet, Take 400 mg by mouth daily., Disp: , Rfl:    methimazole (TAPAZOLE) 10 MG tablet, Take 15 mg by mouth daily. Taking  1 1/2 tabs = 15 mg daily, Disp: , Rfl:    metoprolol succinate (TOPROL-XL) 50 MG 24 hr tablet, Take 1 tablet (50 mg total) by mouth every evening. Take with or immediately following a meal., Disp: 90 tablet, Rfl: 3   Multiple Vitamin (MULTIVITAMIN WITH MINERALS) TABS tablet, Take 1 tablet by mouth daily., Disp: , Rfl:    naloxone (NARCAN) nasal spray 4 mg/0.1 mL, Place 1 spray into the nose as directed., Disp: , Rfl:    Oxycodone HCl 20 MG TABS, Take 20 mg by mouth 4 (four) times daily., Disp: , Rfl:    pregabalin (LYRICA) 200 MG capsule, Take 200 mg by mouth 2 (two) times daily., Disp: , Rfl:    rosuvastatin (CRESTOR) 10 MG tablet, Take 1 tablet by mouth at bedtime., Disp: , Rfl:    cholecalciferol (VITAMIN D3) 25 MCG (1000 UT) tablet, Take 1,000 Units by mouth daily. (Patient not taking: Reported on 06/26/2022), Disp: , Rfl:   Radiology:  No results found.  Cardiac Studies:   Renal artery duplex  08/04/2021: False positive right renal artery stenosis   Abdominal aortogram and selective right and left renal arteriogram 10/03/2021: Abdominal gram debridements of 2 renal arteries 1 on either sides.  There was no significant abdominal aortic atherosclerosis or luminal irregularity. Solitary left renal arteriogram revealed widely patent renal arteries.  Radial artery has an anterior origin from the anterior wall of the aorta.  Suspect false positive Dopplers due to tortuosity.  Recommendation: [name] blood pressure is related to poor diet and obesity.  I will start the patient on Catapres patch.  She will need continued medical therapy.  Echocardiogram 03/23/2020:  1. Left ventricular ejection fraction, by estimation, is  60 to 65%. The  left ventricle has normal function. The left ventricle has no regional  wall motion abnormalities. There is mild left ventricular hypertrophy.  Left ventricular diastolic parameters  are consistent with Grade II diastolic dysfunction (pseudonormalization).   2. Right ventricular systolic function is normal. The right ventricular  size is normal.   3. The mitral valve is normal in structure. Trivial mitral valve  regurgitation. No evidence of mitral stenosis.   4. The aortic valve is normal in structure. Aortic valve regurgitation is  not visualized. No aortic stenosis is present.   5. The inferior vena cava is normal in size with greater than 50%  respiratory variability, suggesting right atrial pressure of 3 mmHg.   EKG   EKG 06/26/2021: Normal sinus rhythm with a rate of 67 bpm, normal axis, poor R progression, cannot exclude anteroseptal infarct old.  Low-voltage complexes.  Consider pulmonary disease pattern.  Compared to 10/11/2021, P wave morphology has changed, cannot exclude ectopic atrial rhythm.  Assessment     ICD-10-CM   1. Primary hypertension  I10 EKG 12-Lead  cloNIDine (CATAPRES) 0.2 MG tablet    PCV ECHOCARDIOGRAM COMPLETE    2. Ectopic atrial rhythm  I49.1 PCV MYOCARDIAL PERFUSION WITH LEXISCAN    PCV ECHOCARDIOGRAM COMPLETE    3. Mixed hyperlipidemia  E78.2     4. Pre-operative cardiovascular examination Re-do back surgery Dr. Rennis Harding  Z01.810 PCV MYOCARDIAL PERFUSION WITH LEXISCAN    5. Aortic atherosclerosis (HCC)  I70.0 PCV MYOCARDIAL PERFUSION WITH LEXISCAN      Meds ordered this encounter  Medications   cloNIDine (CATAPRES) 0.2 MG tablet    Sig: Take 1 tablet (0.2 mg total) by mouth 2 (two) times daily.    Dispense:  60 tablet    Refill:  2   aspirin (ASPIRIN CHILDRENS) 81 MG chewable tablet    Sig: Chew 1 tablet (81 mg total) by mouth daily.   Medications Discontinued During This Encounter  Medication Reason   hydrocortisone  (PROCTOZONE-HC) 2.5 % rectal cream Completed Course   cloNIDine (CATAPRES - DOSED IN MG/24 HR) 0.2 mg/24hr patch Reorder   aspirin EC 325 MG tablet Dose change   Recommendations:   Brittany Carroll  is a 67 y.o. Female patient with history of atrial fibrillation S/P Cardioversion in 2008 without recurrence, hypertension, mixed hyperlipidemia, diabetes mellitus, tobacco use, hyperthyroidism treated with methimazole, fibromyalgia and chronic pain.  She has had some change, this is.  And she is now again scheduled for back surgery.  She is in extreme pain today in the office.  1. Primary hypertension Blood pressure is elevated, previously well-controlled.  I suspect this is related to severe back pain.  She is unable to move and has been walking with a walker as well.  I have added clonidine 0.2 mg twice daily, sometime back she used to use clonidine patches.  2. Ectopic atrial rhythm Today on EKG she has ectopic atrial rhythm.  This does not preclude her from surgery.  I would like to see her back in 6 weeks both for follow-up of hypertension and repeat EKG.  3. Mixed hyperlipidemia Lipids are under excellent control.  External labs reviewed.  In view of moderate aortic atherosclerosis, aggressive lipid control is indicated, she is also on aspirin 81 mg daily.  4. Pre-operative cardiovascular examination Re-do back surgery Dr. Rennis Harding Her surgical date has not been fixed to date.  She will follow-up with her surgeon.  She is diabetic, in view of dyspnea, diabetes, hypertension, hypercholesterolemia, she has now had a nuclear stress test which I will be setting it up with pharmacologic stress test.  5. Aortic atherosclerosis ([place]) As dictated above, she is presently on statin, continue the same.  I changed her aspirin from 325 mg to 81 mg daily.  I would like to see her back in 6 to 8 weeks for follow-up.  She is currently smoking about 2 to 3 cigarettes a day, I encouraged her to completely quit  smoking and be abstinent.     Adrian Prows, MD, [facility] 06/26/2022, 2:00 PM Office: [phone] Fax: [phone] Pager: [phone]

## 2022-06-27 ENCOUNTER — Other Ambulatory Visit: Payer: Self-pay

## 2022-06-28 ENCOUNTER — Ambulatory Visit: Payer: 59

## 2022-06-28 DIAGNOSIS — I10 Essential (primary) hypertension: Secondary | ICD-10-CM

## 2022-06-28 DIAGNOSIS — I491 Atrial premature depolarization: Secondary | ICD-10-CM

## 2022-07-05 ENCOUNTER — Ambulatory Visit: Payer: 59

## 2022-07-05 DIAGNOSIS — Z01810 Encounter for preprocedural cardiovascular examination: Secondary | ICD-10-CM

## 2022-07-05 DIAGNOSIS — I491 Atrial premature depolarization: Secondary | ICD-10-CM

## 2022-07-05 DIAGNOSIS — I700 Atherosclerosis of aorta: Secondary | ICD-10-CM

## 2022-07-12 ENCOUNTER — Encounter: Payer: Self-pay | Admitting: Cardiology

## 2022-07-12 LAB — PCV MYOCARDIAL PERFUSION WITH LEXISCAN: ST Depression (mm): 0 mm

## 2022-07-17 NOTE — Progress Notes (Signed)
  Your stress test is very low risk.  I have sent surgical clearance letter to Dr. Rennis Harding that he can be taken up for surgery with low risk.  Please stop aspirin 1 week prior to the surgery.

## 2022-07-17 NOTE — Progress Notes (Signed)
Sent message to patient in [place] stating above results

## 2022-08-07 ENCOUNTER — Ambulatory Visit: Payer: Medicare HMO | Admitting: Cardiology

## 2022-08-16 ENCOUNTER — Encounter (HOSPITAL_COMMUNITY): Payer: Self-pay | Admitting: *Deleted

## 2022-08-16 ENCOUNTER — Emergency Department (HOSPITAL_COMMUNITY): Payer: Medicare HMO

## 2022-08-16 ENCOUNTER — Emergency Department (HOSPITAL_COMMUNITY)
Admission: EM | Admit: 2022-08-16 | Discharge: 2022-08-16 | Disposition: A | Discharge status: Home / Self Care | Payer: Medicare HMO | Attending: Emergency Medicine | Admitting: Emergency Medicine

## 2022-08-16 ENCOUNTER — Other Ambulatory Visit: Payer: Self-pay

## 2022-08-16 DIAGNOSIS — M549 Dorsalgia, unspecified: Secondary | ICD-10-CM | POA: Diagnosis present

## 2022-08-16 DIAGNOSIS — M545 Low back pain, unspecified: Secondary | ICD-10-CM | POA: Diagnosis not present

## 2022-08-16 DIAGNOSIS — D649 Anemia, unspecified: Secondary | ICD-10-CM | POA: Diagnosis not present

## 2022-08-16 DIAGNOSIS — Z7982 Long term (current) use of aspirin: Secondary | ICD-10-CM | POA: Diagnosis not present

## 2022-08-16 DIAGNOSIS — Z794 Long term (current) use of insulin: Secondary | ICD-10-CM | POA: Insufficient documentation

## 2022-08-16 DIAGNOSIS — Z79899 Other long term (current) drug therapy: Secondary | ICD-10-CM | POA: Diagnosis not present

## 2022-08-16 DIAGNOSIS — Z7984 Long term (current) use of oral hypoglycemic drugs: Secondary | ICD-10-CM | POA: Insufficient documentation

## 2022-08-16 DIAGNOSIS — E119 Type 2 diabetes mellitus without complications: Secondary | ICD-10-CM | POA: Insufficient documentation

## 2022-08-16 DIAGNOSIS — I10 Essential (primary) hypertension: Secondary | ICD-10-CM | POA: Insufficient documentation

## 2022-08-16 LAB — CBC WITH DIFFERENTIAL/PLATELET
Abs Immature Granulocytes: 0.1 10*3/uL — ABNORMAL HIGH (ref 0.00–0.07)
Basophils Absolute: 0.1 10*3/uL (ref 0.0–0.1)
Basophils Relative: 1 %
Eosinophils Absolute: 0.3 10*3/uL (ref 0.0–0.5)
Eosinophils Relative: 3 %
HCT: 24 % — ABNORMAL LOW (ref 36.0–46.0)
Hemoglobin: 7.5 g/dL — ABNORMAL LOW (ref 12.0–15.0)
Immature Granulocytes: 1 %
Lymphocytes Relative: 17 %
Lymphs Abs: 1.7 10*3/uL (ref 0.7–4.0)
MCH: 24.8 pg — ABNORMAL LOW (ref 26.0–34.0)
MCHC: 31.3 g/dL (ref 30.0–36.0)
MCV: 79.5 fL — ABNORMAL LOW (ref 80.0–100.0)
Monocytes Absolute: 0.9 10*3/uL (ref 0.1–1.0)
Monocytes Relative: 9 %
Neutro Abs: 7.1 10*3/uL (ref 1.7–7.7)
Neutrophils Relative %: 69 %
Platelets: 191 10*3/uL (ref 150–400)
RBC: 3.02 MIL/uL — ABNORMAL LOW (ref 3.87–5.11)
RDW: 14.7 % (ref 11.5–15.5)
WBC: 10.2 10*3/uL (ref 4.0–10.5)
nRBC: 0 % (ref 0.0–0.2)

## 2022-08-16 LAB — COMPREHENSIVE METABOLIC PANEL
ALT: 23 U/L (ref 0–44)
AST: 32 U/L (ref 15–41)
Albumin: 2.7 g/dL — ABNORMAL LOW (ref 3.5–5.0)
Alkaline Phosphatase: 66 U/L (ref 38–126)
Anion gap: 8 (ref 5–15)
BUN: 15 mg/dL (ref 8–23)
CO2: 22 mmol/L (ref 22–32)
Calcium: 8.3 mg/dL — ABNORMAL LOW (ref 8.9–10.3)
Chloride: 104 mmol/L (ref 98–111)
Creatinine, Ser: 1.27 mg/dL — ABNORMAL HIGH (ref 0.44–1.00)
GFR, Estimated: 47 mL/min — ABNORMAL LOW (ref >60)
Glucose, Bld: 213 mg/dL — ABNORMAL HIGH (ref 70–99)
Potassium: 3.8 mmol/L (ref 3.5–5.1)
Sodium: 134 mmol/L — ABNORMAL LOW (ref 135–145)
Total Bilirubin: 0.3 mg/dL (ref 0.3–1.2)
Total Protein: 5.8 g/dL — ABNORMAL LOW (ref 6.5–8.1)

## 2022-08-16 LAB — SEDIMENTATION RATE: Sed Rate: 70 mm/hr — ABNORMAL HIGH (ref 0–22)

## 2022-08-16 LAB — C-REACTIVE PROTEIN: CRP: 14.8 mg/dL — ABNORMAL HIGH (ref <1.0)

## 2022-08-16 MED ORDER — HYDROMORPHONE HCL 1 MG/ML IJ SOLN
1.0000 mg | Freq: Once | INTRAMUSCULAR | Status: AC
  Administered 2022-08-16: 1 mg via INTRAVENOUS
  Filled 2022-08-16: qty 1

## 2022-08-16 MED ORDER — MORPHINE SULFATE (PF) 4 MG/ML IV SOLN
4.0000 mg | Freq: Once | INTRAVENOUS | Status: AC
  Administered 2022-08-16: 4 mg via INTRAVENOUS
  Filled 2022-08-16: qty 1

## 2022-08-16 MED ORDER — GADOBUTROL 1 MMOL/ML IV SOLN
10.0000 mL | Freq: Once | INTRAVENOUS | Status: AC | PRN
  Administered 2022-08-16: 10 mL via INTRAVENOUS

## 2022-08-16 MED ORDER — SODIUM CHLORIDE 0.9 % IV BOLUS
500.0000 mL | Freq: Once | INTRAVENOUS | Status: AC
  Administered 2022-08-16: 500 mL via INTRAVENOUS

## 2022-08-16 NOTE — ED Notes (Signed)
Dr. Bennie Pierini Cohen's office called per verbal request by PA Autry. Spoke to receptionist who transferred the call to Dr. Towanda Malkin triage, left detailed message requesting call back to [place].

## 2022-08-16 NOTE — Progress Notes (Signed)
Transition of Care [facility] - Emergency Department Mini Assessment   Patient Details  Name: Brittany Carroll MRN: [id number] Date of Birth: [birth year]  Transition of Care [facility] CM/SW Contact:    Fuller Mandril, RN Phone Number: 08/16/2022, 11:19 AM   Clinical Narrative: Pt currently active with [facility] for Home Health services RN as confirmed by [facility] with Tommi Rumps of [place].  Pt will resume [facility] services at discharge.  No DME needs identified at this time.    ED Mini Assessment: What brought you to the Emergency Department? : (P) bleeding from surgical site  Barriers to Discharge: (P) Continued Medical Work up     [employer] of departure: (P) Car  Interventions which prevented an admission or readmission: (P) Privateer or Services    Patient Contact and Communications        ,          Patient states their goals for this hospitalization and ongoing recovery are:: (P) "Have my wound looked at."      Admission diagnosis:  Bleeding from surgical incision Patient Active Problem List   Diagnosis Date Noted   Chronic prescription opiate use 99991111   Acute metabolic encephalopathy 99991111   Hypotension 01/15/2022   Hyperthyroidism 01/15/2022   DM2 (diabetes mellitus, type 2) ([place]) 01/15/2022   Abnormal liver CT 01/15/2022   Resistant hypertension    Abnormal renal ultrasound    History of lumbar fusion 02/17/2019   Spinal stenosis of lumbar region 12/25/2018   Spondylolisthesis, grade 1 12/25/2018   Low back pain 07/12/2017   PCP:  [name], MD Pharmacy:   [facility] [address] [place] [address] Phone: [phone] Fax: [phone]  [facility] - [place], [place] - [address] [place] [address] Phone: [phone] Fax: [phone]

## 2022-08-16 NOTE — Discharge Instructions (Addendum)
You were seen in the emergency department for drainage from your low back.  I spoke with the PA with your orthopedic team who feels that this is all normal.  Please continue taking your prescribed pain medication.  Please call the office if you have any worsening drainage or pain symptoms to be seen by them.  Please return to emergency department if you have weakness in your lower extremities, numbness and tingling to your groin, inability to control your bowel or bladder.

## 2022-08-16 NOTE — ED Triage Notes (Signed)
Patient is post lumbar surgery on Monday.  She states she was d/c home today.  At 2100 she noticed the area felt wet and she had some bleeding.  This morning, the area was wet again and prompted her to come to the Ed.  Patient still has the dressing in place from the hospital and there is old and new bright red blood noted on the dressing.  Patient denies neuro deficits but has increased pain in the area.  She did not take her pain medication prior to coming to the ED.

## 2022-08-16 NOTE — ED Provider Notes (Signed)
[facility] Provider Note   CSN: [id number] Arrival date & time: 08/16/22  0507     History  Chief Complaint  Patient presents with   Back Pain   Bleeding/Bruising    Brittany Carroll is a 67 y.o. female.  With past medical history of hypertension, hyperlipidemia, atrial fibrillation, type 2 diabetes, lumbar back pain who presents to the emergency department with back pain and bleeding.  States she had lumbar surgery on Monday 08/13/22. States that she was discharged yesterday afternoon. Came home and eventually went to bed. She states last night she noticed her sheets were wet and got up to see blood. She states this continued over night so she presents here for evaluation. She is having lumbar pain and has been taking home oxycodone with minimal relief of symptoms. She denies having fever, nausea.   On chart review patient was seen and admitted on 08/13/2022 at [facility] for revision T10 laminectomy and fusion.    Back Pain      Home Medications Prior to Admission medications   Medication Sig Start Date End Date Taking? Authorizing Provider  amitriptyline (ELAVIL) 75 MG tablet Take 75 mg by mouth at bedtime. 11/15/20   [provider]  aspirin (ASPIRIN CHILDRENS) 81 MG chewable tablet Chew 1 tablet (81 mg total) by mouth daily. 06/26/22   Adrian Prows, MD  buPROPion (WELLBUTRIN XL) 150 MG 24 hr tablet Take 150 mg by mouth daily.    [provider]  cholecalciferol (VITAMIN D3) 25 MCG (1000 UT) tablet Take 1,000 Units by mouth daily. Patient not taking: Reported on 06/26/2022    [provider]  cloNIDine (CATAPRES) 0.2 MG tablet Take 1 tablet (0.2 mg total) by mouth 2 (two) times daily. 06/26/22   Adrian Prows, MD  esomeprazole (NEXIUM) 40 MG capsule Take 40 mg by mouth daily as needed (acid reflux).    [provider]  HUMALOG KWIKPEN 200 UNIT/ML KwikPen Inject 5 Units into the skin 3 (three) times daily  before meals. 06/29/21   [provider]  hydrALAZINE (APRESOLINE) 50 MG tablet Take 1 tablet (50 mg total) by mouth 3 (three) times daily. 05/23/22 05/18/23  Adrian Prows, MD  insulin glargine (LANTUS SOLOSTAR) 100 UNIT/ML Solostar Pen Inject 12 Units into the skin at bedtime.    [provider]  JENTADUETO 2.09-998 MG TABS Take 1 tablet by mouth 2 (two) times daily. 11/09/20   [provider]  losartan (COZAAR) 100 MG tablet Take 1 tablet (100 mg total) by mouth daily. 01/20/22   Rai, Ripudeep K, MD  magnesium oxide (MAG-OX) 400 MG tablet Take 400 mg by mouth daily.    [provider]  methimazole (TAPAZOLE) 10 MG tablet Take 15 mg by mouth daily. Taking  1 1/2 tabs = 15 mg daily    [provider]  metoprolol succinate (TOPROL-XL) 50 MG 24 hr tablet Take 1 tablet (50 mg total) by mouth every evening. Take with or immediately following a meal. 01/26/22 01/21/23  Adrian Prows, MD  Multiple Vitamin (MULTIVITAMIN WITH MINERALS) TABS tablet Take 1 tablet by mouth daily.    [provider]  naloxone [facility]) nasal spray 4 mg/0.1 mL Place 1 spray into the nose as directed. 12/29/21   [provider]  Oxycodone HCl 20 MG TABS Take 20 mg by mouth 4 (four) times daily. 05/30/19   [provider]  pregabalin (LYRICA) 200 MG capsule Take 200 mg by mouth 2 (  two) times daily. 01/05/21   [provider]  rosuvastatin (CRESTOR) 10 MG tablet Take 1 tablet by mouth at bedtime.    [provider]      Allergies    Lisinopril and Metformin and related    Review of Systems   Review of Systems  Musculoskeletal:  Positive for back pain.  Skin:  Positive for wound.  All other systems reviewed and are negative.   Physical Exam Updated Vital Signs BP 138/82 (BP Location: Right Arm)   Pulse 81   Temp 98.7 F (37.1 C) (Oral)   Resp 15   Ht 5\' 7"  (1.702 m)   Wt 99.8 kg   SpO2 98%   BMI 34.46 kg/m  Physical Exam Vitals and nursing  note reviewed.  Constitutional:      General: She is not in acute distress.    Appearance: Normal appearance. She is obese. She is not ill-appearing.  HENT:     Head: Normocephalic.  Eyes:     General: No scleral icterus. Cardiovascular:     Rate and Rhythm: Normal rate and regular rhythm.  Pulmonary:     Effort: Pulmonary effort is normal.     Breath sounds: Normal breath sounds.  Abdominal:     General: Bowel sounds are normal.     Palpations: Abdomen is soft.  Musculoskeletal:     Lumbar back: Bony tenderness present.     Comments: Midline lumbar post-operative scar that still appears fresh. At the most distal portion there is bloody discharge. TTP of the midline.   Skin:    General: Skin is warm and dry.     Capillary Refill: Capillary refill takes less than 2 seconds.  Neurological:     General: No focal deficit present.     Mental Status: She is alert.  Psychiatric:        Mood and Affect: Mood normal.        Behavior: Behavior normal.     ED Results / Procedures / Treatments   Labs (all labs ordered are listed, but only abnormal results are displayed) Labs Reviewed  COMPREHENSIVE METABOLIC PANEL - Abnormal; Notable for the following components:      Result Value   Sodium 134 (*)    Glucose, Bld 213 (*)    Creatinine, Ser 1.27 (*)    Calcium 8.3 (*)    Total Protein 5.8 (*)    Albumin 2.7 (*)    GFR, Estimated 47 (*)    All other components within normal limits  CBC WITH DIFFERENTIAL/PLATELET - Abnormal; Notable for the following components:   RBC 3.02 (*)    Hemoglobin 7.5 (*)    HCT 24.0 (*)    MCV 79.5 (*)    MCH 24.8 (*)    Abs Immature Granulocytes 0.10 (*)    All other components within normal limits  SEDIMENTATION RATE - Abnormal; Notable for the following components:   Sed Rate 70 (*)    All other components within normal limits  C-REACTIVE PROTEIN - Abnormal; Notable for the following components:   CRP 14.8 (*)    All other components within  normal limits    EKG None  Radiology MR Lumbar Spine W Wo Contrast (assess for abscess, cord compression)  Result Date: 08/16/2022 CLINICAL DATA:  History of back surgery three days ago with drainage and bleeding. EXAM: MRI THORACIC AND LUMBAR SPINE WITHOUT AND WITH CONTRAST TECHNIQUE: Multiplanar and multiecho pulse sequences of the thoracic and lumbar spine were  obtained without and with intravenous contrast. CONTRAST:  70mL GADAVIST GADOBUTROL 1 MMOL/ML IV SOLN COMPARISON:  Lumbar spine MRI 01/17/2022, thoracic spine CT 07/20/2020 FINDINGS: MRI THORACIC SPINE FINDINGS Postsurgical changes are noted in the cervical spine on the count sequence, incompletely evaluated. Alignment: There is no significant antero or retrolisthesis. There is mild levocurvature centered at T5-T6. Vertebrae: Vertebral body heights are preserved. Background marrow signal is normal. There is no suspicious marrow signal abnormality, marrow edema, or marrow enhancement. There is no evidence of discitis/osteomyelitis. There is no evidence of epidural fluid collection, though evaluation of the spinal canal below the T9 level is limited due to artifact from the hardware. Postsurgical changes reflecting posterior fusion extending from T10 through the pelvis are noted, with interval superior extension of the fusion construct since the lumbar spine MRI from 01/17/2022. Cord: Normal in signal and morphology. There is no definite abnormal cord enhancement, though the cord below T9 is suboptimally evaluated due to artifact from the hardware. Paraspinal and other soft tissues: There is a fluid collection in the soft tissues posterior to the surgical levels measuring 5.1 cm TV x 2.5 cm AP x approximately 16.0 cm CC, new in the interim. There is no definite peripheral enhancement. There is no definite dural tear, though evaluation is degraded by susceptibility artifact. The fluid collection appears to extend to the overlying skin surface which  may correspond to the reported drainage. There is small layering fluid level within the collection which may reflect blood products (7-39). Disc levels: There is multilevel disc desiccation and narrowing in the midthoracic spine. C7-T1: There is a disc bulge extending into both neural foramina resulting in moderate left and mild right neural foraminal stenosis without significant spinal canal stenosis T1-T2: Mild disc bulge without significant spinal canal or neural foraminal stenosis. T2-T3: Mild disc bulge and mild bilateral facet arthropathy without significant spinal canal or neural foraminal stenosis. T3-T4: Small left foraminal protrusion and mild facet arthropathy without significant spinal canal or neural foraminal stenosis T4-T5 through T8-T9: Mild facet arthropathy without significant spinal canal or neural foraminal stenosis. T9-T10: There is a mild disc bulge and mild bilateral facet arthropathy resulting in mild left worse than right facet arthropathy without significant spinal canal stenosis. T10-T11: Suboptimally evaluated due to susceptibility artifact from the hardware. Mild disc bulge without significant spinal canal or neural foraminal stenosis T11-T12: Suboptimally evaluated due to susceptibility artifact. No significant spinal canal or right neural foraminal stenosis. The left neural foramen is not well assessed. MRI LUMBAR SPINE FINDINGS Segmentation: Standard; the lowest formed disc space is designated L5-S1. Alignment:  Grade 1 anterolisthesis of L3 on L4 is unchanged. Vertebrae: The patient is status post posterior fusion from T10 through the pelvis with interval superior extension of the hardware construct as above, previously extending to the L2 level. The L2 pedicle screws appear to has been removed. Susceptibility artifact from the hardware degrades evaluation of the vertebral bodies; however, there is no definite acute or suspicious marrow signal abnormality. Conus medullaris: The conus  medullaris and cauda equina nerve roots are not well delineated due to significant susceptibility artifact from the hardware. Paraspinal and other soft tissues: As above, there is a large fluid collection in the soft tissues posterior to the surgical levels. There is no definite peripheral enhancement. There is no definite dural tear or connection with the spinal canal; however, evaluation is degraded by susceptibility artifact. Disc levels: T12-L1: Status post posterior instrumented fusion. No definite spinal canal or neural foraminal stenosis. L1-L2:  Status post posterior instrumented fusion. There is a mild disc bulge contributing to probable mild right neural foraminal stenosis. No definite spinal canal or left neural foraminal stenosis L2-L3: Status post posterior instrumented fusion with interbody spacer placement. The spacer appears located posteriorly and to the right within the disc space and contributes to persistent severe spinal canal stenosis. At least moderate bilateral neural foraminal stenosis. Findings are grossly similar to the prior MRI. L3-L4: Status post posterior fusion. There is unchanged grade 1 anterolisthesis without significant spinal canal or neural foraminal stenosis L4-L5: Status post posterior fusion. There is endplate spurring without significant spinal canal or neural foraminal stenosis, unchanged L5-S1: Status post posterior fusion with interbody spacer placement. No significant residual spinal canal or neural foraminal stenosis. IMPRESSION: 1. Postsurgical changes reflecting posterior fusion from T10 through the sacrum with interval superior extension of the fusion construct since the lumbar spine MRI from 01/17/2022. There is a large fluid collection in the soft tissues posterior to the surgical levels which extends to the overlying skin surface, possibly corresponding to the reported drainage. This may reflect a postoperative seroma, though abscess is not excluded by imaging. CSF  leak is also a differential consideration. No definite dural tear is seen though evaluation is degraded by artifact. 2. Persistent severe spinal canal stenosis and at least moderate bilateral neural foraminal stenosis at L2-L3, not significantly changed compared to the prior MRI. 3. Degenerative changes throughout the remainder of the thoracic and lumbar spine as above without other high-grade spinal canal or neural foraminal stenosis. Electronically Signed   By: Valetta Mole M.D.   On: 08/16/2022 10:47   MR THORACIC SPINE W WO CONTRAST  Result Date: 08/16/2022 CLINICAL DATA:  History of back surgery three days ago with drainage and bleeding. EXAM: MRI THORACIC AND LUMBAR SPINE WITHOUT AND WITH CONTRAST TECHNIQUE: Multiplanar and multiecho pulse sequences of the thoracic and lumbar spine were obtained without and with intravenous contrast. CONTRAST:  58mL GADAVIST GADOBUTROL 1 MMOL/ML IV SOLN COMPARISON:  Lumbar spine MRI 01/17/2022, thoracic spine CT 07/20/2020 FINDINGS: MRI THORACIC SPINE FINDINGS Postsurgical changes are noted in the cervical spine on the count sequence, incompletely evaluated. Alignment: There is no significant antero or retrolisthesis. There is mild levocurvature centered at T5-T6. Vertebrae: Vertebral body heights are preserved. Background marrow signal is normal. There is no suspicious marrow signal abnormality, marrow edema, or marrow enhancement. There is no evidence of discitis/osteomyelitis. There is no evidence of epidural fluid collection, though evaluation of the spinal canal below the T9 level is limited due to artifact from the hardware. Postsurgical changes reflecting posterior fusion extending from T10 through the pelvis are noted, with interval superior extension of the fusion construct since the lumbar spine MRI from 01/17/2022. Cord: Normal in signal and morphology. There is no definite abnormal cord enhancement, though the cord below T9 is suboptimally evaluated due to  artifact from the hardware. Paraspinal and other soft tissues: There is a fluid collection in the soft tissues posterior to the surgical levels measuring 5.1 cm TV x 2.5 cm AP x approximately 16.0 cm CC, new in the interim. There is no definite peripheral enhancement. There is no definite dural tear, though evaluation is degraded by susceptibility artifact. The fluid collection appears to extend to the overlying skin surface which may correspond to the reported drainage. There is small layering fluid level within the collection which may reflect blood products (7-39). Disc levels: There is multilevel disc desiccation and narrowing in the midthoracic spine. C7-T1: There  is a disc bulge extending into both neural foramina resulting in moderate left and mild right neural foraminal stenosis without significant spinal canal stenosis T1-T2: Mild disc bulge without significant spinal canal or neural foraminal stenosis. T2-T3: Mild disc bulge and mild bilateral facet arthropathy without significant spinal canal or neural foraminal stenosis. T3-T4: Small left foraminal protrusion and mild facet arthropathy without significant spinal canal or neural foraminal stenosis T4-T5 through T8-T9: Mild facet arthropathy without significant spinal canal or neural foraminal stenosis. T9-T10: There is a mild disc bulge and mild bilateral facet arthropathy resulting in mild left worse than right facet arthropathy without significant spinal canal stenosis. T10-T11: Suboptimally evaluated due to susceptibility artifact from the hardware. Mild disc bulge without significant spinal canal or neural foraminal stenosis T11-T12: Suboptimally evaluated due to susceptibility artifact. No significant spinal canal or right neural foraminal stenosis. The left neural foramen is not well assessed. MRI LUMBAR SPINE FINDINGS Segmentation: Standard; the lowest formed disc space is designated L5-S1. Alignment:  Grade 1 anterolisthesis of L3 on L4 is  unchanged. Vertebrae: The patient is status post posterior fusion from T10 through the pelvis with interval superior extension of the hardware construct as above, previously extending to the L2 level. The L2 pedicle screws appear to has been removed. Susceptibility artifact from the hardware degrades evaluation of the vertebral bodies; however, there is no definite acute or suspicious marrow signal abnormality. Conus medullaris: The conus medullaris and cauda equina nerve roots are not well delineated due to significant susceptibility artifact from the hardware. Paraspinal and other soft tissues: As above, there is a large fluid collection in the soft tissues posterior to the surgical levels. There is no definite peripheral enhancement. There is no definite dural tear or connection with the spinal canal; however, evaluation is degraded by susceptibility artifact. Disc levels: T12-L1: Status post posterior instrumented fusion. No definite spinal canal or neural foraminal stenosis. L1-L2: Status post posterior instrumented fusion. There is a mild disc bulge contributing to probable mild right neural foraminal stenosis. No definite spinal canal or left neural foraminal stenosis L2-L3: Status post posterior instrumented fusion with interbody spacer placement. The spacer appears located posteriorly and to the right within the disc space and contributes to persistent severe spinal canal stenosis. At least moderate bilateral neural foraminal stenosis. Findings are grossly similar to the prior MRI. L3-L4: Status post posterior fusion. There is unchanged grade 1 anterolisthesis without significant spinal canal or neural foraminal stenosis L4-L5: Status post posterior fusion. There is endplate spurring without significant spinal canal or neural foraminal stenosis, unchanged L5-S1: Status post posterior fusion with interbody spacer placement. No significant residual spinal canal or neural foraminal stenosis. IMPRESSION: 1.  Postsurgical changes reflecting posterior fusion from T10 through the sacrum with interval superior extension of the fusion construct since the lumbar spine MRI from 01/17/2022. There is a large fluid collection in the soft tissues posterior to the surgical levels which extends to the overlying skin surface, possibly corresponding to the reported drainage. This may reflect a postoperative seroma, though abscess is not excluded by imaging. CSF leak is also a differential consideration. No definite dural tear is seen though evaluation is degraded by artifact. 2. Persistent severe spinal canal stenosis and at least moderate bilateral neural foraminal stenosis at L2-L3, not significantly changed compared to the prior MRI. 3. Degenerative changes throughout the remainder of the thoracic and lumbar spine as above without other high-grade spinal canal or neural foraminal stenosis. Electronically Signed   By: Court Joy.D.  On: 08/16/2022 10:47    Procedures Procedures   Medications Ordered in ED Medications  morphine (PF) 4 MG/ML injection 4 mg (4 mg Intravenous Given 08/16/22 0826)  sodium chloride 0.9 % bolus 500 mL (0 mLs Intravenous Stopped 08/16/22 1131)  gadobutrol (GADAVIST) 1 MMOL/ML injection 10 mL (10 mLs Intravenous Contrast Given 08/16/22 1009)  HYDROmorphone (DILAUDID) injection 1 mg (1 mg Intravenous Given 08/16/22 1121)    ED Course/ Medical Decision Making/ A&P Clinical Course as of 08/16/22 1255  Thu Aug 16, 2022  1053 Will consult with Dr. Patrice Paradise, pt ortho surgeon for recommendations  [LA]    Clinical Course User Index [LA] Mickie Hillier, PA-C   {    Medical Decision Making Amount and/or Complexity of Data Reviewed Labs: ordered. Radiology: ordered.  Risk Prescription drug management.  Initial Impression and Ddx 67 year old female who presents to the emergency department postoperatively with lumbar pain and drainage from her surgical site Patient PMH that increases  complexity of ED encounter: Hypertension, hyperlipidemia, atrial fibrillation, type 2 diabetes Differential: Postoperative seroma or infection or hardware malfunction  Interpretation of Diagnostics I independent reviewed and interpreted the labs as followed: CBC without leukocytosis, mild anemia to 7.5, similar to her discharge hemoglobin.  CMP with stable creatinine function.  Her acute reactants are elevated  - I independently visualized the following imaging with scope of interpretation limited to determining acute life threatening conditions related to emergency care: MRI of the T and L-spine, which revealed large fluid collection in the soft tissues posterior to the surgical levels extending to the overlying skin surface  Patient Reassessment and Ultimate Disposition/Management 67 year old female who presents with postoperative lumbar pain and bloody drainage from the surgical site.  Physical exam as noted above.  She is afebrile and nontoxic-appearing.  Will obtain labs including CRP, ESR and will obtain MRI to evaluate for postoperative infection versus seroma versus etc.  Will give pain medication and reassess  On reassessment after pain medication she had mild improvement of symptoms but is having ongoing pain.  I will redose her with IM Dilaudid.  Labs with anemia to 7.5.  She is not lightheaded or dizzy or short of breath.  She had anemia around 7.8 at time of discharge.  History of anemia as well.  Do not feel that she needs transfusion at this time.  CMP is without significant derangement.  She does have a mild elevation in her creatinine, likely postoperative decreased p.o. intake.  She was given 500 mL IV fluids.  Her acute phase reactants are elevated, to be expected postoperatively.  Given the lack of fever or leukocytosis I have low suspicion for acute infection at this time.  We obtained MRI of the T and L-spine which reveals a large fluid collection in the soft tissues posterior to  the surgical levels extending to the overlying skin surface.  This is likely a seroma.  I consulted and spoke with Dr. Patrice Paradise, the patient's orthopedic surgeon.  I spoke with his PA who recommended redressing her wound, having her follow-up as scheduled in clinic.  She also requested the patient call her clinic prior to coming to the emergency department so that they can evaluate her.  I discussed this with the patient.  We redressed her postoperative dressing and provided her with more dressing materials for home.  She is instructed continues pain control, following up with her orthopedic clinic.  Given return precautions for cauda equina symptoms or fever.  She verbalized understanding.  Safe  for discharge at this time.  The patient has been appropriately medically screened and/or stabilized in the ED. I have low suspicion for any other emergent medical condition which would require further screening, evaluation or treatment in the ED or require inpatient management. At time of discharge the patient is hemodynamically stable and in no acute distress. I have discussed work-up results and diagnosis with patient and answered all questions. Patient is agreeable with discharge plan. We discussed strict return precautions for returning to the emergency department and they verbalized understanding.     Patient management required discussion with the following services or consulting groups:  Orthopedic Surgery  Complexity of Problems Addressed Acute complicated illness or Injury  Additional Data Reviewed and Analyzed Further history obtained from: Further history from spouse/family member, Past medical history and medications listed in the EMR, Prior ED visit notes, Recent discharge summary, Care Everywhere, and Prior labs/imaging results  Patient Encounter Risk Assessment Prescriptions, Use of parenteral controlled substances, and Consideration of hospitalization  Final Clinical Impression(s) / ED  Diagnoses Final diagnoses:  Acute midline low back pain without sciatica    Rx / DC Orders ED Discharge Orders     None         Mickie Hillier, PA-C 08/16/22 [place], [facility], MD 08/17/22 [id number]

## 2022-08-20 ENCOUNTER — Other Ambulatory Visit: Payer: Self-pay | Admitting: Family Medicine

## 2022-08-20 DIAGNOSIS — F17210 Nicotine dependence, cigarettes, uncomplicated: Secondary | ICD-10-CM

## 2022-09-13 ENCOUNTER — Encounter (HOSPITAL_COMMUNITY): Payer: Self-pay

## 2022-09-13 ENCOUNTER — Emergency Department (HOSPITAL_COMMUNITY): Payer: 59

## 2022-09-13 ENCOUNTER — Emergency Department (HOSPITAL_COMMUNITY)
Admission: EM | Admit: 2022-09-13 | Discharge: 2022-09-14 | Disposition: A | Discharge status: Other Acute Inpatient Hospital | Payer: 59 | Attending: Emergency Medicine | Admitting: Emergency Medicine

## 2022-09-13 ENCOUNTER — Other Ambulatory Visit: Payer: Self-pay

## 2022-09-13 ENCOUNTER — Emergency Department (HOSPITAL_BASED_OUTPATIENT_CLINIC_OR_DEPARTMENT_OTHER): Payer: 59

## 2022-09-13 DIAGNOSIS — Z7982 Long term (current) use of aspirin: Secondary | ICD-10-CM | POA: Insufficient documentation

## 2022-09-13 DIAGNOSIS — R937 Abnormal findings on diagnostic imaging of other parts of musculoskeletal system: Secondary | ICD-10-CM | POA: Diagnosis not present

## 2022-09-13 DIAGNOSIS — R6510 Systemic inflammatory response syndrome (SIRS) of non-infectious origin without acute organ dysfunction: Secondary | ICD-10-CM | POA: Diagnosis not present

## 2022-09-13 DIAGNOSIS — M545 Low back pain, unspecified: Secondary | ICD-10-CM | POA: Diagnosis present

## 2022-09-13 DIAGNOSIS — M79604 Pain in right leg: Secondary | ICD-10-CM | POA: Insufficient documentation

## 2022-09-13 DIAGNOSIS — E871 Hypo-osmolality and hyponatremia: Secondary | ICD-10-CM | POA: Insufficient documentation

## 2022-09-13 DIAGNOSIS — R0902 Hypoxemia: Secondary | ICD-10-CM | POA: Diagnosis not present

## 2022-09-13 DIAGNOSIS — E119 Type 2 diabetes mellitus without complications: Secondary | ICD-10-CM | POA: Insufficient documentation

## 2022-09-13 DIAGNOSIS — Z79899 Other long term (current) drug therapy: Secondary | ICD-10-CM | POA: Insufficient documentation

## 2022-09-13 DIAGNOSIS — M79605 Pain in left leg: Secondary | ICD-10-CM | POA: Insufficient documentation

## 2022-09-13 DIAGNOSIS — R7989 Other specified abnormal findings of blood chemistry: Secondary | ICD-10-CM | POA: Diagnosis not present

## 2022-09-13 DIAGNOSIS — N179 Acute kidney failure, unspecified: Secondary | ICD-10-CM | POA: Diagnosis not present

## 2022-09-13 DIAGNOSIS — R0781 Pleurodynia: Secondary | ICD-10-CM | POA: Diagnosis not present

## 2022-09-13 DIAGNOSIS — F17210 Nicotine dependence, cigarettes, uncomplicated: Secondary | ICD-10-CM | POA: Diagnosis not present

## 2022-09-13 DIAGNOSIS — Z794 Long term (current) use of insulin: Secondary | ICD-10-CM | POA: Diagnosis not present

## 2022-09-13 DIAGNOSIS — R109 Unspecified abdominal pain: Secondary | ICD-10-CM | POA: Diagnosis not present

## 2022-09-13 DIAGNOSIS — M9689 Other intraoperative and postprocedural complications and disorders of the musculoskeletal system: Secondary | ICD-10-CM | POA: Diagnosis not present

## 2022-09-13 DIAGNOSIS — I10 Essential (primary) hypertension: Secondary | ICD-10-CM | POA: Diagnosis not present

## 2022-09-13 DIAGNOSIS — M79606 Pain in leg, unspecified: Secondary | ICD-10-CM | POA: Diagnosis not present

## 2022-09-13 LAB — CBC WITH DIFFERENTIAL/PLATELET
Abs Immature Granulocytes: 0.04 10*3/uL (ref 0.00–0.07)
Basophils Absolute: 0 10*3/uL (ref 0.0–0.1)
Basophils Relative: 0 %
Eosinophils Absolute: 0.3 10*3/uL (ref 0.0–0.5)
Eosinophils Relative: 4 %
HCT: 27.9 % — ABNORMAL LOW (ref 36.0–46.0)
Hemoglobin: 8.1 g/dL — ABNORMAL LOW (ref 12.0–15.0)
Immature Granulocytes: 1 %
Lymphocytes Relative: 30 %
Lymphs Abs: 2.3 10*3/uL (ref 0.7–4.0)
MCH: 23.5 pg — ABNORMAL LOW (ref 26.0–34.0)
MCHC: 29 g/dL — ABNORMAL LOW (ref 30.0–36.0)
MCV: 81.1 fL (ref 80.0–100.0)
Monocytes Absolute: 0.8 10*3/uL (ref 0.1–1.0)
Monocytes Relative: 10 %
Neutro Abs: 4.3 10*3/uL (ref 1.7–7.7)
Neutrophils Relative %: 55 %
Platelets: 301 10*3/uL (ref 150–400)
RBC: 3.44 MIL/uL — ABNORMAL LOW (ref 3.87–5.11)
RDW: 15.4 % (ref 11.5–15.5)
WBC: 7.7 10*3/uL (ref 4.0–10.5)
nRBC: 0 % (ref 0.0–0.2)

## 2022-09-13 LAB — URINALYSIS, ROUTINE W REFLEX MICROSCOPIC
Bilirubin Urine: NEGATIVE
Glucose, UA: NEGATIVE mg/dL
Hgb urine dipstick: NEGATIVE
Ketones, ur: NEGATIVE mg/dL
Nitrite: NEGATIVE
Protein, ur: NEGATIVE mg/dL
Specific Gravity, Urine: 1.018 (ref 1.005–1.030)
pH: 5 (ref 5.0–8.0)

## 2022-09-13 LAB — COMPREHENSIVE METABOLIC PANEL
ALT: 15 U/L (ref 0–44)
AST: 18 U/L (ref 15–41)
Albumin: 3.5 g/dL (ref 3.5–5.0)
Alkaline Phosphatase: 90 U/L (ref 38–126)
Anion gap: 12 (ref 5–15)
BUN: 50 mg/dL — ABNORMAL HIGH (ref 8–23)
CO2: 21 mmol/L — ABNORMAL LOW (ref 22–32)
Calcium: 9.3 mg/dL (ref 8.9–10.3)
Chloride: 100 mmol/L (ref 98–111)
Creatinine, Ser: 3.02 mg/dL — ABNORMAL HIGH (ref 0.44–1.00)
GFR, Estimated: 16 mL/min — ABNORMAL LOW (ref >60)
Glucose, Bld: 110 mg/dL — ABNORMAL HIGH (ref 70–99)
Potassium: 5.3 mmol/L — ABNORMAL HIGH (ref 3.5–5.1)
Sodium: 133 mmol/L — ABNORMAL LOW (ref 135–145)
Total Bilirubin: 0.5 mg/dL (ref 0.3–1.2)
Total Protein: 7.1 g/dL (ref 6.5–8.1)

## 2022-09-13 LAB — D-DIMER, QUANTITATIVE: D-Dimer, Quant: 3.23 ug/mL-FEU — ABNORMAL HIGH (ref 0.00–0.50)

## 2022-09-13 LAB — PROTIME-INR
INR: 1.1 (ref 0.8–1.2)
Prothrombin Time: 14.4 seconds (ref 11.4–15.2)

## 2022-09-13 LAB — LACTIC ACID, PLASMA
Lactic Acid, Venous: 1.1 mmol/L (ref 0.5–1.9)
Lactic Acid, Venous: 1.5 mmol/L (ref 0.5–1.9)

## 2022-09-13 MED ORDER — VANCOMYCIN HCL 1500 MG/300ML IV SOLN
1500.0000 mg | Freq: Once | INTRAVENOUS | Status: DC
  Filled 2022-09-13: qty 300

## 2022-09-13 MED ORDER — TECHNETIUM TO 99M ALBUMIN AGGREGATED
4.4000 | Freq: Once | INTRAVENOUS | Status: AC | PRN
  Administered 2022-09-13: 4.4 via INTRAVENOUS

## 2022-09-13 MED ORDER — LACTATED RINGERS IV SOLN: INTRAVENOUS | Status: DC

## 2022-09-13 MED ORDER — SODIUM CHLORIDE 0.9 % IV BOLUS
500.0000 mL | Freq: Once | INTRAVENOUS | Status: AC
  Administered 2022-09-13: 500 mL via INTRAVENOUS

## 2022-09-13 MED ORDER — LACTATED RINGERS IV BOLUS (SEPSIS): 500.0000 mL | Freq: Once | INTRAVENOUS | Status: DC

## 2022-09-13 MED ORDER — LACTATED RINGERS IV BOLUS
1000.0000 mL | Freq: Once | INTRAVENOUS | Status: AC
  Administered 2022-09-13: 1000 mL via INTRAVENOUS

## 2022-09-13 MED ORDER — SODIUM CHLORIDE 0.9 % IV SOLN: INTRAVENOUS | Status: DC

## 2022-09-13 MED ORDER — VANCOMYCIN HCL 2000 MG/400ML IV SOLN
2000.0000 mg | Freq: Once | INTRAVENOUS | Status: AC
  Administered 2022-09-13: 2000 mg via INTRAVENOUS
  Filled 2022-09-13: qty 400

## 2022-09-13 MED ORDER — VANCOMYCIN VARIABLE DOSE PER UNSTABLE RENAL FUNCTION (PHARMACIST DOSING): Status: DC

## 2022-09-13 MED ORDER — LACTATED RINGERS IV BOLUS (SEPSIS)
1000.0000 mL | Freq: Once | INTRAVENOUS | Status: AC
  Administered 2022-09-13: 1000 mL via INTRAVENOUS

## 2022-09-13 MED ORDER — METRONIDAZOLE 500 MG/100ML IV SOLN
500.0000 mg | Freq: Once | INTRAVENOUS | Status: AC
  Administered 2022-09-13: 500 mg via INTRAVENOUS
  Filled 2022-09-13: qty 100

## 2022-09-13 MED ORDER — MORPHINE SULFATE (PF) 4 MG/ML IV SOLN
4.0000 mg | Freq: Once | INTRAVENOUS | Status: AC
  Administered 2022-09-13: 4 mg via INTRAVENOUS
  Filled 2022-09-13: qty 1

## 2022-09-13 MED ORDER — LACTATED RINGERS IV BOLUS (SEPSIS): 1000.0000 mL | Freq: Once | INTRAVENOUS | Status: DC

## 2022-09-13 MED ORDER — FENTANYL CITRATE PF 50 MCG/ML IJ SOSY
50.0000 ug | PREFILLED_SYRINGE | Freq: Once | INTRAMUSCULAR | Status: AC
  Administered 2022-09-13: 50 ug via INTRAVENOUS
  Filled 2022-09-13: qty 1

## 2022-09-13 MED ORDER — SODIUM CHLORIDE 0.9 % IV SOLN
2.0000 g | Freq: Once | INTRAVENOUS | Status: AC
  Administered 2022-09-13: 2 g via INTRAVENOUS
  Filled 2022-09-13: qty 12.5

## 2022-09-13 MED ORDER — SODIUM CHLORIDE 0.9 % IV SOLN: 2.0000 g | INTRAVENOUS | Status: DC

## 2022-09-13 NOTE — ED Notes (Signed)
Pt transported to nuc med  

## 2022-09-13 NOTE — ED Provider Notes (Signed)
  [place] EMERGENCY DEPARTMENT AT [facility] Transfer of Care Note I assumed care of Brittany Carroll on 09/13/2022 at 3 PM from [place], PA-C.   Briefly, Brittany Carroll is a 67 y.o. female who: PMHx: Spinal scoliosis P/w worsening back pain after recent surgery for spinal scoliosis Initially was hypotensive, got broad-spectrum antibiotics, had elevated D-dimer, no DVT on ultrasound, VQ scan without evidence of PE. Does have AKI Awaiting callback from spine surgeon at [facility] with [facility] system  Plan at the time of handoff: Discuss case with [facility], anticipate transfer   Please refer to the original provider's note for additional information regarding the care of Brittany Carroll.  Reassessment: I personally reassessed the patient: Patient without any acute complaints or additional questions.  Vital Signs:  ED Triage Vitals  Enc Vitals Group     BP 09/13/22 0344 (!) 97/49     Pulse Rate 09/13/22 0344 73     Resp 09/13/22 0344 20     Temp 09/13/22 0344 99.2 F (37.3 C)     Temp src --      SpO2 09/13/22 0344 97 %     Weight 09/13/22 0700 220 lb (99.8 kg)     Height 09/13/22 0700  (1.702 m)     Head Circumference --      Peak Flow --      Pain Score 09/13/22 0356 10     Pain Loc --      Pain Edu? --      Excl. in GC? --      Hemodynamics:  The patient is hemodynamically stable. Mental Status:  The patient is alert  Additional MDM: Did require multiple doses of IV analgesia while in the ED.  [facility]'s initial [facility], he states that the postoperative fluid collection is anticipated, feels the patient is most appropriate for medical admission rather than surgical admission.  Spoke with Drinda Butts with the [facility] regional operations center multiple times, was excepted by Dr. Fanny Bien the hospital service at [facility] regional.  At the time of handoff, patient  was awaiting transfer to [facility] regional.  Disposition: HANDOFF: At the time of signout, the patients transfer had not yet been completed. I transferred care of the patient at the time of signout to [place], [place]. I informed the incoming care provider of the patient's history, status, and management plan. I addressed all of their concerns and/or questions to the best of my ability. Please refer to the incoming care provider's note for details regarding the remainder of the patient's ED course and disposition.  Patient seen in conjunction with Dr. Olivia Canter, MD Emergency Medicine, PGY-2    Curley Spice, MD 09/13/22 2357    Virgina Norfolk, DO 09/14/22 1610

## 2022-09-13 NOTE — ED Provider Notes (Signed)
[place] EMERGENCY DEPARTMENT AT [facility] Provider Note   CSN: [id number] Arrival date & time: 09/13/22  4098     History  Chief Complaint  Patient presents with   Back Pain    Brittany Carroll is a 67 y.o. female with h/o FM, DM, HTN, lumbar spinal fusion with revision in March, baseline right sided weakness presents to the ER for evaluation of back, rib, and bilateral leg pain. The patient reports that this happened two days ago whenever Brittany Carroll was laying in bed and tried to sit up and felt a spasm from her mid upper back all the way down to the back of her legs. Brittany Carroll reports that this lasted for about 5 minutes and then went away. Brittany Carroll reports this same sensation happened last night and was concerned so came into the ER for evaluation.  Brittany Carroll denies any fecal or urinary incontinence.  Brittany Carroll denies any trouble with urination or any urinary retention.  Brittany Carroll denies any fevers.  Brittany Carroll reports that Brittany Carroll had an episode of saddle anesthesia last week and ever Brittany Carroll was sitting up on the side of her bed Brittany Carroll felt numb there however resolved.  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  Brittany Carroll denies any chest pain, shortness of breath, dysuria, hematuria, Donnell pain, nausea, vomiting, diarrhea, or constipation.  Brittany Carroll has baseline right-sided weakness and walks with a cane.  Brittany Carroll was a previous smoker and quit 2 weeks ago.  Brittany Carroll denies any alcohol or any current drug use.    Back Pain Associated symptoms: no chest pain, no dysuria, no fever, no numbness and no weakness        Home Medications Prior to Admission medications   Medication Sig Start Date End Date Taking? Authorizing Provider  amitriptyline (ELAVIL) 75 MG tablet Take 75 mg by mouth at bedtime. 11/15/20   [provider]  aspirin (ASPIRIN CHILDRENS) 81 MG chewable tablet Chew 1 tablet (81 mg total) by mouth daily. 06/26/22   Yates Decamp, MD  buPROPion (WELLBUTRIN XL) 150 MG 24 hr tablet Take 150 mg by mouth  daily.    [provider]  cholecalciferol (VITAMIN D3) 25 MCG (1000 UT) tablet Take 1,000 Units by mouth daily. Patient not taking: Reported on 06/26/2022    [provider]  cloNIDine (CATAPRES) 0.2 MG tablet Take 1 tablet (0.2 mg total) by mouth 2 (two) times daily. 06/26/22   Yates Decamp, MD  esomeprazole (NEXIUM) 40 MG capsule Take 40 mg by mouth daily as needed (acid reflux).    [provider]  HUMALOG KWIKPEN 200 UNIT/ML KwikPen Inject 5 Units into the skin 3 (three) times daily before meals. 06/29/21   [provider]  hydrALAZINE (APRESOLINE) 50 MG tablet Take 1 tablet (50 mg total) by mouth 3 (three) times daily. 05/23/22 05/18/23  Yates Decamp, MD  insulin glargine (LANTUS SOLOSTAR) 100 UNIT/ML Solostar Pen Inject 12 Units into the skin at bedtime.    [provider]  JENTADUETO 2.09-998 MG TABS Take 1 tablet by mouth 2 (two) times daily. 11/09/20   [provider]  losartan (COZAAR) 100 MG tablet Take 1 tablet (100 mg total) by mouth daily. 01/20/22   Rai, Ripudeep K, MD  magnesium oxide (MAG-OX) 400 MG tablet Take 400 mg by mouth daily.    [provider]  methimazole (TAPAZOLE) 10 MG tablet Take 15 mg by mouth daily. Taking  1 1/2 tabs = 15 mg daily  [provider]  metoprolol succinate (TOPROL-XL) 50 MG 24 hr tablet Take 1 tablet (50 mg total) by mouth every evening. Take with or immediately following a meal. 01/26/22 01/21/23  Yates Decamp, MD  Multiple Vitamin (MULTIVITAMIN WITH MINERALS) TABS tablet Take 1 tablet by mouth daily.    [provider]  naloxone [facility]) nasal spray 4 mg/0.1 mL Place 1 spray into the nose as directed. 12/29/21   [provider]  Oxycodone HCl 20 MG TABS Take 20 mg by mouth 4 (four) times daily. 05/30/19   [provider]  pregabalin (LYRICA) 200 MG capsule Take 200 mg by mouth 2 (two) times daily. 01/05/21   [provider]  rosuvastatin (CRESTOR) 10 MG tablet  Take 1 tablet by mouth at bedtime.    [provider]      Allergies    Lisinopril and Metformin and related    Review of Systems   Review of Systems  Constitutional:  Negative for chills and fever.  Respiratory:  Negative for shortness of breath.   Cardiovascular:  Negative for chest pain.  Gastrointestinal:  Negative for abdominal distention, constipation, diarrhea, nausea and vomiting.       Denies any fecal incontinence  Genitourinary:  Negative for dysuria and hematuria.       Denies any urinary incontinence or urinary retention.  Musculoskeletal:  Positive for back pain and myalgias.       Denies any saddle anesthesia  Neurological:  Negative for weakness and numbness.    Physical Exam Updated Vital Signs BP (!) 99/57 (BP Location: Left Arm)   Pulse 66   Temp 99.2 F (37.3 C)   Resp 18   SpO2 95%  Physical Exam Vitals and nursing note reviewed.  Constitutional:      General: Brittany Carroll is not in acute distress.    Appearance: Normal appearance. Brittany Carroll is not toxic-appearing.  HENT:     Head: Normocephalic and atraumatic.  Eyes:     General: No scleral icterus. Cardiovascular:     Rate and Rhythm: Normal rate and regular rhythm.  Pulmonary:     Effort: Pulmonary effort is normal. No respiratory distress.  Abdominal:     Palpations: Abdomen is soft.     Tenderness: There is no abdominal tenderness. There is no guarding or rebound.  Musculoskeletal:        General: Tenderness present.       Arms:     Cervical back: Normal range of motion.     Comments: Tenderness to the marked area above. No crepitus.  Patient has a long midline incision overlying her spine.  Appears well-healing.  No fluctuance appreciated.  No drainage.  Is nontender to palpation per patient.  No paraspinal tenderness as well.  No tenderness over the SI joints.  Brittany Carroll has some mild decrease strength in the right side and has increased pain in her back with movement of the right-sided patient reports  that her baseline.  Brittany Carroll has palpable DP pulses and palpable PT pulses bilaterally.  Her compartments are soft.  Her sensation is reportedly symmetric and intact.  Brittany Carroll has good grip strength and strength is intact in her upper extremities bilaterally.  Sensations intact in the upper extremities as well.  Nontender to palpation.  Compartments are soft.  Skin:    General: Skin is warm and dry.  Neurological:     Mental Status: Brittany Carroll is alert. Mental status is at baseline.     ED Results / Procedures /  Treatments   Labs (all labs ordered are listed, but only abnormal results are displayed) Labs Reviewed  CBC WITH DIFFERENTIAL/PLATELET - Abnormal; Notable for the following components:      Result Value   RBC 3.44 (*)    Hemoglobin 8.1 (*)    HCT 27.9 (*)    MCH 23.5 (*)    MCHC 29.0 (*)    All other components within normal limits  CULTURE, BLOOD (ROUTINE X 2)  CULTURE, BLOOD (ROUTINE X 2)  PROTIME-INR  COMPREHENSIVE METABOLIC PANEL  LACTIC ACID, PLASMA  LACTIC ACID, PLASMA  URINALYSIS, ROUTINE W REFLEX MICROSCOPIC    EKG None  Radiology DG Chest 2 View  Result Date: 09/13/2022 CLINICAL DATA:  67 year old female with suspected sepsis. Spine surgery last month. EXAM: CHEST - 2 VIEW COMPARISON:  Chest radiographs 05/11/2022 and earlier. FINDINGS: PA and lateral views at 0415 hours. Cephalad extension of the posterior spinal fusion hardware from the lumbar through the lower thoracic spine since December. Previous anterior and posterior cervical spine fusion. No acute osseous abnormality identified. Mildly lower lung volumes compared to December. X central aided cardiac size. Other mediastinal contours are within normal limits. Visualized tracheal air column is within normal limits. No pneumothorax, pleural effusion or consolidation. Nonspecific increased pulmonary interstitial Interstitial markings appear to be chronic and not significantly changed allowing for lung volumes. Mild chronic  scarring also evident in the lingula. 2021 CT demonstrated emphysema. Negative visible bowel gas. Stable cholecystectomy clips. IMPRESSION: 1. Lower lung volumes. Chronic Emphysema (ICD10-J43.9). No acute cardiopulmonary abnormality. 2. Spinal postoperative changes. Electronically Signed   By: Odessa Fleming M.D.   On: 09/13/2022 04:34    Procedures Procedures   Medications Ordered in ED Medications - No data to display  ED Course/ Medical Decision Making/ A&P    Medical Decision Making Amount and/or Complexity of Data Reviewed Labs: ordered. Radiology: ordered.  Risk Prescription drug management.   67 y.o. female presents to the ER for evaluation of back pain, rib pain, radiating down to the legs. Differential diagnosis includes but is not limited to Fracture (acute/chronic), muscle strain, cauda equina, spinal stenosis, DDD, ligamentous injury, disk herniation, metastatic cancer, vertebral osteomyelitis, kidney stone, pyelonephritis, AAA, pancreatitis, bowel obstruction, meningitis, PE. Vital signs hypotension, otherwise unremarkable. Physical exam as noted above.   I independently reviewed and interpreted the patient's labs.  CBC shows hemoglobin 8.1, patient baseline anemic.  No leukocytosis.  Normal platelets.  Lactic acid normal at 1.5.  CMP shows decreased sodium at 133 and elevated potassium of 5.3.  Bicarb decreased to 21 however normal anion gap.  Patient has an elevated BUN at 50 which appears it has been that high previously.  New AKI at 3.02. Other labs pending.  CXR shows 1. Lower lung volumes. Chronic Emphysema (ICD10-J43.9). No acute cardiopulmonary abnormality. 2. Spinal postoperative changes.  Initially, CT angio ordered of the chest on pelvis to rule out any dissection given patient's story.  Given her new AKI, Brittany Carroll does not have a GFR greater than 30 so no contrast can be used.  I question dimer for possible PE however my attending recommends proceeding with the scans to see if  other causes are apparent.  CT renal, CT chest, and CT L-spine ordered.   It was documented that Brittany Carroll had hypoxia at 82%, but this was while Brittany Carroll was sleeping. Will add undifferentiated sepsis coverage for antibiotics. Fluids per ideal body weight ordered.   7:54 AM Care of Aricela Birdsall transferred to [facility]  at the end of my shift as the patient will require reassessment once labs/imaging have resulted. Patient presentation, ED course, and plan of care discussed with review of all pertinent labs and imaging. Please see his/her note for further details regarding further ED course and disposition. Plan at time of handoff is follow up on imaging and labs. This may be altered or completely changed at the discretion of the oncoming team pending results of further workup.  I discussed this case with my attending physician who cosigned this note including patient's presenting symptoms, physical exam, and planned diagnostics and interventions. Attending physician stated agreement with plan or made changes to plan which were implemented.   Portions of this report may have been transcribed using voice recognition software. Every effort was made to ensure accuracy; however, inadvertent computerized transcription errors may be present.   Final Clinical Impression(s) / ED Diagnoses Final diagnoses:  None    Rx / DC Orders ED Discharge Orders     None         Achille Rich, PA-C 09/13/22 0981    Arby Barrette, MD 10/08/22 2055

## 2022-09-13 NOTE — ED Notes (Signed)
Pt qwas able to walk to the restroom to provide a urine sample

## 2022-09-13 NOTE — ED Notes (Signed)
Attempted iv xe; unsuccessful. IV consulted

## 2022-09-13 NOTE — ED Triage Notes (Signed)
Pt arrived from home via POV c/o back lower right sided back pain. 10/10. Pt states that on that right side that it hurts to take a deep breath. Pt denies any sob. Pt hx recent lumbar sx last month.

## 2022-09-13 NOTE — ED Notes (Signed)
Pt transported to CT ?

## 2022-09-13 NOTE — Progress Notes (Addendum)
Pharmacy Antibiotic Note  Brittany Carroll is a 67 y.o. female admitted on 09/13/2022 with lower back, rib and bilateral leg pain for 2 days. Patient had revision of T10 to the pelvis laminectomy and fusion, osteotomies on 08/13/22 and was discharged home on 08/15/22. Empiric antibiotics for treatment of sepsis have been initiated.  Pharmacy has been consulted for Cefepime and Vancomycin dosing.  WBC 7.7, Tmax 99.2, LA 1.5 SCr 3.02 (baseline SCr ~1)  Plan: Initiate Cefepime 2g IV q24h x 7 days Initiate loading dose of Vancomycin  IV x 1, followed by  Vancomycin variable dosing for unstable renal function for a total of 7 days    > Goal vancomycin trough level 15-20 Continue metronidazole  IV q12h per MD Monitor daily CBC, temp, SCr, and for clinical signs of improvement  F/u cultures and de-escalate antibiotics as able     Temp (24hrs), Avg:99.2 F (37.3 C), Min:99.2 F (37.3 C), Max:99.2 F (37.3 C)  Recent Labs  Lab 09/13/22 0408  WBC 7.7  CREATININE 3.02*  LATICACIDVEN 1.1    CrCl cannot be calculated (Unknown ideal weight.).    Allergies  Allergen Reactions   Lisinopril Hives   Metformin And Related Hives    Antimicrobials this admission: Cefepime 4/18 >>  Vancomycin 4/18 >>  Metronidazole 4/18 >>   Dose adjustments this admission: N/A  Microbiology results: 4/18 BCx: sent    Thank you for allowing pharmacy to be a part of this patient's care.  Wilburn Cornelia, PharmD, BCPS Clinical Pharmacist 09/13/2022 7:56 AM   Please refer to AMION for pharmacy phone number

## 2022-09-13 NOTE — ED Notes (Signed)
Patient desat to 70 while asleep with good waveform. 2L of O2 applied. Patient now at 97%

## 2022-09-13 NOTE — ED Provider Notes (Signed)
Care assumed from previous provider.  See note for full HPI.  67 year old recent spine surgery with Dr. Noel Gerold with spine and scoliosis specialist here for evaluation of back pain.  Noted to be hypotensive.  Hx of prior back pain.  Was in the emergency department a few weeks ago for similar.  Sepsis workup started by previous provider.  Plan to follow-up on remaining labs, imaging and reassessment   FU on labs, imaging Physical Exam  BP 107/60   Pulse 63   Temp 98.2 F (36.8 C)   Resp 19   Ht 5\' 7"  (1.702 m)   Wt 99.8 kg   SpO2 94%   BMI 34.46 kg/m   Physical Exam Vitals and nursing note reviewed.  Constitutional:      General: She is not in acute distress.    Appearance: She is well-developed. She is not ill-appearing.  HENT:     Head: Normocephalic and atraumatic.     Nose: Nose normal.     Mouth/Throat:     Mouth: Mucous membranes are moist.  Eyes:     Pupils: Pupils are equal, round, and reactive to light.  Cardiovascular:     Rate and Rhythm: Normal rate.     Pulses: Normal pulses.     Heart sounds: Normal heart sounds.  Pulmonary:     Effort: Pulmonary effort is normal. No respiratory distress.     Breath sounds: Normal breath sounds.  Abdominal:     General: Bowel sounds are normal. There is no distension.     Palpations: Abdomen is soft.     Tenderness: There is no abdominal tenderness.     Comments: soft, Nontender, negative CVA tap bilaterally  Musculoskeletal:        General: Normal range of motion.     Cervical back: Normal range of motion.     Comments: Midline Surgical scar, nontender without any drainage or redness  Skin:    General: Skin is warm and dry.     Capillary Refill: Capillary refill takes less than 2 seconds.     Comments: No obvious rash  Neurological:     General: No focal deficit present.     Mental Status: She is alert and oriented to person, place, and time.  Psychiatric:        Mood and Affect: Mood normal.    Procedures   .Critical Care  Performed by: Linwood Dibbles, PA-C Authorized by: Linwood Dibbles, PA-C   Critical care provider statement:    Critical care time (minutes):  35   Critical care was necessary to treat or prevent imminent or life-threatening deterioration of the following conditions:  Sepsis   Critical care was time spent personally by me on the following activities:  Development of treatment plan with patient or surrogate, discussions with consultants, evaluation of patient's response to treatment, examination of patient, ordering and review of laboratory studies, ordering and review of radiographic studies, ordering and performing treatments and interventions, pulse oximetry, re-evaluation of patient's condition and review of old charts  Labs Reviewed  COMPREHENSIVE METABOLIC PANEL - Abnormal; Notable for the following components:      Result Value   Sodium 133 (*)    Potassium 5.3 (*)    CO2 21 (*)    Glucose, Bld 110 (*)    BUN 50 (*)    Creatinine, Ser 3.02 (*)    GFR, Estimated 16 (*)    All other components within normal limits  CBC WITH DIFFERENTIAL/PLATELET -  Abnormal; Notable for the following components:   RBC 3.44 (*)    Hemoglobin 8.1 (*)    HCT 27.9 (*)    MCH 23.5 (*)    MCHC 29.0 (*)    All other components within normal limits  URINALYSIS, ROUTINE W REFLEX MICROSCOPIC - Abnormal; Notable for the following components:   APPearance HAZY (*)    Leukocytes,Ua TRACE (*)    Bacteria, UA RARE (*)    Non Squamous Epithelial 0-5 (*)    All other components within normal limits  D-DIMER, QUANTITATIVE - Abnormal; Notable for the following components:   D-Dimer, Quant 3.23 (*)    All other components within normal limits  CULTURE, BLOOD (ROUTINE X 2)  CULTURE, BLOOD (ROUTINE X 2)  LACTIC ACID, PLASMA  LACTIC ACID, PLASMA  PROTIME-INR   MR THORACIC SPINE WO CONTRAST  Result Date: 09/13/2022 CLINICAL DATA:  Mid-back pain back pain, recent surgery, hypotensive,  sepsis; Low back pain, infection suspected, no prior imaging back pain, recent surgyer, hypotensive, sepsis EXAM: MRI THORACIC AND LUMBAR SPINE WITHOUT CONTRAST TECHNIQUE: Multiplanar and multiecho pulse sequences of the thoracic and lumbar spine were obtained without intravenous contrast. COMPARISON:  MRI 08/16/2022, 01/17/2022 FINDINGS: MRI THORACIC SPINE FINDINGS Alignment:  Physiologic. Vertebrae: Extensive T10 to bi-iliac posterior spinal fusion with associated metallic susceptibility artifact. No evidence of thoracic spine fracture. No findings to suggest discitis. No marrow replacing bone lesion. Cord: No focal cord signal abnormality. Evaluation of the cord is limited below the T9-10 level secondary to susceptibility artifact. No visible epidural space fluid collection. Paraspinal and other soft tissues: Postoperative fluid collection within the posterior paraspinal soft tissues has enlarged since 08/16/2022. Collection now measures approximately 8.4 x 5.0 cm in transaxial dimension (previously measured approximately 5.2 x 3.5 cm). There are thin, strandy internal septations. Disc levels: Similar degree of mild degenerative spondylosis of the non fused levels. Right greater than left foraminal stenosis at T9-T10. Mild left foraminal stenosis at T2-T3. No significant canal stenosis is evident. MRI LUMBAR SPINE FINDINGS Segmentation:  Standard. Alignment:  Unchanged grade 1 anterolisthesis of L3 on L4. Vertebrae: Extensive posterior and interbody fusion. Posteriorly oriented interbody spacer at L2-3 is again noted. No evidence of fracture or discitis. No marrow replacing bone lesion identified. Conus medullaris and cauda equina: Conus is obscured by artifact. Cauda carina nerve roots are largely obscured by artifact. Paraspinal and other soft tissues: Postoperative fluid collection within the posterior paraspinal soft tissues has enlarged. There is a thin more superficially located collection at the midline  spanning from L1 to L3 measuring 2.2 x 1.0 cm transaxially and extends 6.7 cm in craniocaudal dimension. Disc levels: Limited evaluation of the intervertebral disc levels due to extensive susceptibility artifact from patient's hardware. There may be mild narrowing of the canal at the L2-L3 level, not well assessed. No evidence of high-grade foraminal stenosis. IMPRESSION: 1. Extensive T10 to bi-iliac posterior spinal fusion. 2. Postoperative fluid collection within the posterior paraspinal soft tissues has enlarged since 08/16/2022. Collection now measures up to 8.4 x 5.0 cm in maximal transaxial dimension (previously measured approximately 5.2 x 3.5 cm). Collection spans from the T9 to L4 levels. Although this may represent an evolving postoperative seroma or hematoma, CSF leak should be considered given the continued enlargement of this collection. An infected fluid collection would be difficult to exclude by imaging alone. 3. There is a thin more superficially located collection at the midline spanning from L1 to L3 measuring 6.7 x 2.2 x 1.0 cm.  4. No findings to suggest discitis or osteomyelitis. 5. No significant canal stenosis within the thoracic or lumbar spine. There may be mild narrowing of the canal at the L2-L3 level, not well assessed. Electronically Signed   By: Duanne Guess D.O.   On: 09/13/2022 13:09   MR LUMBAR SPINE WO CONTRAST  Result Date: 09/13/2022 CLINICAL DATA:  Mid-back pain back pain, recent surgery, hypotensive, sepsis; Low back pain, infection suspected, no prior imaging back pain, recent surgyer, hypotensive, sepsis EXAM: MRI THORACIC AND LUMBAR SPINE WITHOUT CONTRAST TECHNIQUE: Multiplanar and multiecho pulse sequences of the thoracic and lumbar spine were obtained without intravenous contrast. COMPARISON:  MRI 08/16/2022, 01/17/2022 FINDINGS: MRI THORACIC SPINE FINDINGS Alignment:  Physiologic. Vertebrae: Extensive T10 to bi-iliac posterior spinal fusion with associated metallic  susceptibility artifact. No evidence of thoracic spine fracture. No findings to suggest discitis. No marrow replacing bone lesion. Cord: No focal cord signal abnormality. Evaluation of the cord is limited below the T9-10 level secondary to susceptibility artifact. No visible epidural space fluid collection. Paraspinal and other soft tissues: Postoperative fluid collection within the posterior paraspinal soft tissues has enlarged since 08/16/2022. Collection now measures approximately 8.4 x 5.0 cm in transaxial dimension (previously measured approximately 5.2 x 3.5 cm). There are thin, strandy internal septations. Disc levels: Similar degree of mild degenerative spondylosis of the non fused levels. Right greater than left foraminal stenosis at T9-T10. Mild left foraminal stenosis at T2-T3. No significant canal stenosis is evident. MRI LUMBAR SPINE FINDINGS Segmentation:  Standard. Alignment:  Unchanged grade 1 anterolisthesis of L3 on L4. Vertebrae: Extensive posterior and interbody fusion. Posteriorly oriented interbody spacer at L2-3 is again noted. No evidence of fracture or discitis. No marrow replacing bone lesion identified. Conus medullaris and cauda equina: Conus is obscured by artifact. Cauda carina nerve roots are largely obscured by artifact. Paraspinal and other soft tissues: Postoperative fluid collection within the posterior paraspinal soft tissues has enlarged. There is a thin more superficially located collection at the midline spanning from L1 to L3 measuring 2.2 x 1.0 cm transaxially and extends 6.7 cm in craniocaudal dimension. Disc levels: Limited evaluation of the intervertebral disc levels due to extensive susceptibility artifact from patient's hardware. There may be mild narrowing of the canal at the L2-L3 level, not well assessed. No evidence of high-grade foraminal stenosis. IMPRESSION: 1. Extensive T10 to bi-iliac posterior spinal fusion. 2. Postoperative fluid collection within the  posterior paraspinal soft tissues has enlarged since 08/16/2022. Collection now measures up to 8.4 x 5.0 cm in maximal transaxial dimension (previously measured approximately 5.2 x 3.5 cm). Collection spans from the T9 to L4 levels. Although this may represent an evolving postoperative seroma or hematoma, CSF leak should be considered given the continued enlargement of this collection. An infected fluid collection would be difficult to exclude by imaging alone. 3. There is a thin more superficially located collection at the midline spanning from L1 to L3 measuring 6.7 x 2.2 x 1.0 cm. 4. No findings to suggest discitis or osteomyelitis. 5. No significant canal stenosis within the thoracic or lumbar spine. There may be mild narrowing of the canal at the L2-L3 level, not well assessed. Electronically Signed   By: Duanne Guess D.O.   On: 09/13/2022 13:09   VAS [place] LOWER EXTREMITY VENOUS (DVT) (ONLY [facility] & [facility])  Result Date: 09/13/2022  Lower Venous DVT Study Patient Name:  GERTHA LICHTENBERG  Date of Exam:   09/13/2022 Medical Rec #: [id number]      Accession #:  [id number] Date of Birth: Dec 25, 1955      Patient Gender: F Patient Age:   81 years Exam Location:  [facility] Procedure:      VAS [place] LOWER EXTREMITY VENOUS (DVT) Referring Phys: Aerika Groll --------------------------------------------------------------------------------  Indications: Radiating lower right back pain.  Risk Factors: Recent back surgery (08/13/2022). Comparison Study: No previous exams Performing Technologist: Jody Hill RVT, RDMS  Examination Guidelines: A complete evaluation includes B-mode imaging, spectral Doppler, color Doppler, and power Doppler as needed of all accessible portions of each vessel. Bilateral testing is considered an integral part of a complete examination. Limited examinations for reoccurring indications may be performed as noted. The reflux portion of the exam is performed with the patient in reverse Trendelenburg.   +---------+---------------+---------+-----------+----------+--------------+ RIGHT    CompressibilityPhasicitySpontaneityPropertiesThrombus Aging +---------+---------------+---------+-----------+----------+--------------+ CFV      Full           Yes      Yes                                 +---------+---------------+---------+-----------+----------+--------------+ SFJ      Full                                                        +---------+---------------+---------+-----------+----------+--------------+ FV Prox  Full           Yes      Yes                                 +---------+---------------+---------+-----------+----------+--------------+ FV Mid   Full           Yes      Yes                                 +---------+---------------+---------+-----------+----------+--------------+ FV DistalFull           Yes      Yes                                 +---------+---------------+---------+-----------+----------+--------------+ PFV      Full                                                        +---------+---------------+---------+-----------+----------+--------------+ POP      Full           Yes      Yes                                 +---------+---------------+---------+-----------+----------+--------------+ PTV      Full                                                        +---------+---------------+---------+-----------+----------+--------------+  PERO     Full                                                        +---------+---------------+---------+-----------+----------+--------------+   +---------+---------------+---------+-----------+----------+--------------+ LEFT     CompressibilityPhasicitySpontaneityPropertiesThrombus Aging +---------+---------------+---------+-----------+----------+--------------+ CFV      Full           Yes      Yes                                  +---------+---------------+---------+-----------+----------+--------------+ SFJ      Full                                                        +---------+---------------+---------+-----------+----------+--------------+ FV Prox  Full           Yes      Yes                                 +---------+---------------+---------+-----------+----------+--------------+ FV Mid   Full           Yes      Yes                                 +---------+---------------+---------+-----------+----------+--------------+ FV DistalFull           Yes      Yes                                 +---------+---------------+---------+-----------+----------+--------------+ PFV      Full                                                        +---------+---------------+---------+-----------+----------+--------------+ POP      Full           Yes      Yes                                 +---------+---------------+---------+-----------+----------+--------------+ PTV      Full                                                        +---------+---------------+---------+-----------+----------+--------------+ PERO     Full                                                        +---------+---------------+---------+-----------+----------+--------------+  Summary: BILATERAL: - No evidence of deep vein thrombosis seen in the lower extremities, bilaterally. -No evidence of popliteal cyst, bilaterally.   *See table(s) above for measurements and observations.    Preliminary    NM Pulmonary Perfusion  Result Date: 09/13/2022 CLINICAL DATA:  Chest pain EXAM: NUCLEAR MEDICINE PERFUSION LUNG SCAN TECHNIQUE: Perfusion images were obtained in multiple projections after intravenous injection of radiopharmaceutical. Ventilation scans intentionally deferred if perfusion scan and chest x-ray adequate for interpretation during COVID 19 epidemic. RADIOPHARMACEUTICALS:  4.4 mCi Tc-52m MAA IV COMPARISON:  X-ray  and CT scan chest 09/13/2022 FINDINGS: Normal perfusion examination. No significant perfusion abnormalities. IMPRESSION: Negative perfusion lung scan Electronically Signed   By: Karen Kays M.D.   On: 09/13/2022 11:34   CT L-SPINE NO CHARGE  Result Date: 09/13/2022 CLINICAL DATA:  67 year old female with multiple prior spine surgeries, most recently last month. Abdomen and flank pain. EXAM: CT LUMBAR SPINE WITHOUT CONTRAST TECHNIQUE: Technique: Multiplanar CT images of the lumbar spine were reconstructed from contemporary CT of the Abdomen and Pelvis. RADIATION DOSE REDUCTION: This exam was performed according to the departmental dose-optimization program which includes automated exposure control, adjustment of the mA and/or kV according to patient size and/or use of iterative reconstruction technique. CONTRAST:  None. COMPARISON:  CT Abdomen and Pelvis today is reported separately. Postoperative thoracic and lumbar MRI 08/16/2022. Lumbar CT myelogram 07/14/2021. FINDINGS: Segmentation: Normal, the same numbering system used on the prior CT myelogram. And the MRIs last month. Alignment: Stable lower thoracic and lumbar lordosis from last month. No significant scoliosis or spondylolisthesis. Vertebrae: Evidence of developing T9-T10 interbody ankylosis also. Visible lower thoracic levels appear intact with postoperative details below. Lumbar vertebrae appear stable since the February CT myelogram with postoperative details below. Visible sacrum and SI joints appear intact. No acute osseous abnormality identified. Paraspinal and other soft tissues: Abdomen and pelvis viscera are detailed separately today. Postoperative changes to the lower thoracic and lumbar paraspinal soft tissues with unresolved posterior postoperative fluid as demonstrated by MRI last month. But no other adverse features. Disc levels: T8-T9: Solid interbody ankylosis. T9-T10: Chronic disc and endplate degeneration with evidence of early  interbody ankylosis on sagittal image 42. Endplate and facet spurring with at least moderate bilateral T9 foraminal stenosis. T10-T11: Bilateral T10 and T11 pedicle screws in place with no adverse features. Disc space loss with small volume interbody gas phenomena. No arthrodesis at this time. T11-T12: Bilateral T11 and T12 pedicle screws in place with no adverse features. Mild bilateral facet hypertrophy, evidence of early left facet bridging bone on sagittal image 49. Relatively preserved disc space. T12-L1: Bilateral T12 and L1 pedicle screws with no adverse features. Relatively preserved disc height. Mild to moderate facet hypertrophy. Partially calcified ligament flavum hypertrophy. No arthrodesis at this time. L1-L2: New L1 pedicle screws with no adverse features and previously-seen loose bilateral L2 pedicle screws have been removed. Previous L2 level decompression. No arthrodesis. L2-L3: Unchanged interbody implants situated somewhat posteriorly and eccentric to the right. Superimposed right lateral recess chronic endplate spurring and residual posterior element hypertrophy following decompression of this level. Chronic L2 pars fractures, with some interval healing on the left side compared to the February CT myelogram. But no convincing bridging bone at this time. L3-L4: L3 and L4 pedicle screws remain in place with no adverse features. Previous decompression. Solid-appearing posterior element arthrodesis. L4-L5: L4 and L5 pedicle screws remain in place with no adverse features. Previous posterior decompression. Evidence of solid arthrodesis at this level.  L5-S1: L5 and S1 pedicle screws and interbody implant remain in place with no adverse features. Solid posterior element arthrodesis. Chronic osseous neural foraminal stenosis on the right appears stable. Chronic bilateral sacral and iliac bone screws appear stable. IMPRESSION: 1. Cephalad extension of posterior spinal fusion hardware to the T10 level and  removal of previously loosened L2 pedicle screws since the February CT myelogram. Stable partially retropulsed L2-L3 interbody implant. 2. Partial healing of left side L2 pars fracture since February. Unchanged right side L2 pars fracture. No convincing L2-L3 arthrodesis at this time. Other lumbar postoperative appearance unchanged since February. 3. Thoracic vertebral pedicle screws up to the T10 level with no adverse features. Underlying T8-T9 interbody ankylosis, and probable early ankylosis at T9-T10. 4. CT Abdomen and Pelvis today reported separately. Electronically Signed   By: Odessa Fleming M.D.   On: 09/13/2022 07:34   CT Chest Wo Contrast  Result Date: 09/13/2022 CLINICAL DATA:  67 year old female with history of rib cage pain. EXAM: CT CHEST WITHOUT CONTRAST TECHNIQUE: Multidetector CT imaging of the chest was performed following the standard protocol without IV contrast. RADIATION DOSE REDUCTION: This exam was performed according to the departmental dose-optimization program which includes automated exposure control, adjustment of the mA and/or kV according to patient size and/or use of iterative reconstruction technique. COMPARISON:  Low-dose lung cancer screening chest CT 04/25/2020. FINDINGS: Cardiovascular: Heart size is normal. There is no significant pericardial fluid, thickening or pericardial calcification. There is aortic atherosclerosis, as well as atherosclerosis of the great vessels of the mediastinum and the coronary arteries, including calcified atherosclerotic plaque in the left main, left anterior descending, left circumflex and right coronary arteries. Mediastinum/Nodes: No pathologically enlarged mediastinal or hilar lymph nodes. Please note that accurate exclusion of hilar adenopathy is limited on noncontrast CT scans. Esophagus is unremarkable in appearance. No axillary lymphadenopathy. Lungs/Pleura: Diffuse bronchial wall thickening with moderate centrilobular and paraseptal emphysema.  Extensive scarring in the lower lobes of the lungs bilaterally, in addition to the inferior segment of the lingula, similar to prior CT the abdomen and pelvis 01/15/2022. No definite acute consolidative airspace disease. No pleural effusions. No definite suspicious appearing pulmonary nodules or masses are noted. Upper Abdomen: See separate dictation for recently dictated contemporaneously obtained CT of the abdomen and pelvis 09/13/2022 for full description of findings below the diaphragm. Musculoskeletal: Orthopedic rod and screw fixation hardware in the lower thoracic and upper lumbar spine partially imaged (see separate dictation for contemporaneously obtained CT of the lumbar spine for full description of spinal hardware in related findings of recent spinal surgery). There are no acute displaced fractures or aggressive appearing lytic or blastic lesions noted in the visualized portions of the skeleton. IMPRESSION: 1. No acute findings in the thorax to account for the patient's symptoms. 2. Extensive scarring throughout the lung bases bilaterally, similar to prior examinations. 3. Aortic atherosclerosis, in addition to left main and three-vessel coronary artery disease. Please note that although the presence of coronary artery calcium documents the presence of coronary artery disease, the severity of this disease and any potential stenosis cannot be assessed on this non-gated CT examination. Assessment for potential risk factor modification, dietary therapy or pharmacologic therapy may be warranted, if clinically indicated. 4. Diffuse bronchial wall thickening with moderate centrilobular and paraseptal emphysema; imaging findings suggestive of underlying COPD. Aortic Atherosclerosis (ICD10-I70.0) and Emphysema (ICD10-J43.9). Electronically Signed   By: Trudie Reed M.D.   On: 09/13/2022 07:30   CT Renal Stone Study  Result Date: 09/13/2022  CLINICAL DATA:  67 year old female history of abdominal and flank  pain. Recent history of back surgery. EXAM: CT ABDOMEN AND PELVIS WITHOUT CONTRAST TECHNIQUE: Multidetector CT imaging of the abdomen and pelvis was performed following the standard protocol without IV contrast. RADIATION DOSE REDUCTION: This exam was performed according to the departmental dose-optimization program which includes automated exposure control, adjustment of the mA and/or kV according to patient size and/or use of iterative reconstruction technique. COMPARISON:  CT of the abdomen and pelvis 01/15/2022. FINDINGS: Comment: Portions of today's examination are limited by substantial beam hardening artifact from the patient's indwelling spinal hardware. Lower chest: Extensive scarring in the lung bases bilaterally, similar to the prior study. Atherosclerotic calcifications in the descending thoracic aorta as well as the right coronary artery. Hepatobiliary: Liver has a shrunken appearance and nodular contour, indicative of underlying cirrhosis. No discrete cystic or solid hepatic lesions are confidently identified on today's noncontrast CT examination. Status post cholecystectomy. Pancreas: No definite pancreatic mass or peripancreatic fluid collections or inflammatory changes are noted on today's noncontrast CT examination. Spleen: Unremarkable. Adrenals/Urinary Tract: There are no abnormal calcifications within the collecting system of either kidney, along the course of either ureter, or within the lumen of the urinary bladder. No hydroureteronephrosis or perinephric stranding to suggest urinary tract obstruction at this time. The unenhanced appearance of the right kidney is unremarkable. 1.6 cm exophytic low-attenuation lesion in the lateral aspect of the interpolar region of the left kidney, incompletely characterized on today's noncontrast CT examination, but unenhanced appearance of the urinary bladder moderately distended, but otherwise unremarkable. Bilateral adrenal glands are normal in appearance.  Stomach/Bowel: Unenhanced appearance of the stomach is normal. There is no pathologic dilatation of small bowel or colon. Normal appendix. Vascular/Lymphatic: Atherosclerotic calcifications in the abdominal aorta and pelvic vasculature. No definite lymphadenopathy confidently identified in the abdomen or pelvis. Reproductive: Status post hysterectomy. Ovaries are not confidently identified may be surgically absent or atrophic. Other: No significant volume of ascites.  No pneumoperitoneum. Musculoskeletal: Extensive rod and screw fixation hardware throughout the visualized thoracolumbar spine (will be described separately under dictation for lumbar spine CT examination). There are no aggressive appearing lytic or blastic lesions noted in the visualized portions of the skeleton. IMPRESSION: 1. No acute findings are noted in the abdomen or pelvis to account for the patient's symptoms. 2. Moderately distended urinary bladder. This is nonspecific, but clinical correlation for signs and symptoms of urinary retention is recommended. 3. Cirrhosis. 4. Aortic atherosclerosis. Electronically Signed   By: Trudie Reed M.D.   On: 09/13/2022 07:09   DG Chest 2 View  Result Date: 09/13/2022 CLINICAL DATA:  67 year old female with suspected sepsis. Spine surgery last month. EXAM: CHEST - 2 VIEW COMPARISON:  Chest radiographs 05/11/2022 and earlier. FINDINGS: PA and lateral views at 0415 hours. Cephalad extension of the posterior spinal fusion hardware from the lumbar through the lower thoracic spine since December. Previous anterior and posterior cervical spine fusion. No acute osseous abnormality identified. Mildly lower lung volumes compared to December. X central aided cardiac size. Other mediastinal contours are within normal limits. Visualized tracheal air column is within normal limits. No pneumothorax, pleural effusion or consolidation. Nonspecific increased pulmonary interstitial Interstitial markings appear to be  chronic and not significantly changed allowing for lung volumes. Mild chronic scarring also evident in the lingula. 2021 CT demonstrated emphysema. Negative visible bowel gas. Stable cholecystectomy clips. IMPRESSION: 1. Lower lung volumes. Chronic Emphysema (ICD10-J43.9). No acute cardiopulmonary abnormality. 2. Spinal postoperative changes. Electronically Signed  By: Odessa Fleming M.D.   On: 09/13/2022 04:34   MR Lumbar Spine W Wo Contrast (assess for abscess, cord compression)  Result Date: 08/16/2022 CLINICAL DATA:  History of back surgery three days ago with drainage and bleeding. EXAM: MRI THORACIC AND LUMBAR SPINE WITHOUT AND WITH CONTRAST TECHNIQUE: Multiplanar and multiecho pulse sequences of the thoracic and lumbar spine were obtained without and with intravenous contrast. CONTRAST:  10mL GADAVIST GADOBUTROL 1 MMOL/ML IV SOLN COMPARISON:  Lumbar spine MRI 01/17/2022, thoracic spine CT 07/20/2020 FINDINGS: MRI THORACIC SPINE FINDINGS Postsurgical changes are noted in the cervical spine on the count sequence, incompletely evaluated. Alignment: There is no significant antero or retrolisthesis. There is mild levocurvature centered at T5-T6. Vertebrae: Vertebral body heights are preserved. Background marrow signal is normal. There is no suspicious marrow signal abnormality, marrow edema, or marrow enhancement. There is no evidence of discitis/osteomyelitis. There is no evidence of epidural fluid collection, though evaluation of the spinal canal below the T9 level is limited due to artifact from the hardware. Postsurgical changes reflecting posterior fusion extending from T10 through the pelvis are noted, with interval superior extension of the fusion construct since the lumbar spine MRI from 01/17/2022. Cord: Normal in signal and morphology. There is no definite abnormal cord enhancement, though the cord below T9 is suboptimally evaluated due to artifact from the hardware. Paraspinal and other soft tissues:  There is a fluid collection in the soft tissues posterior to the surgical levels measuring 5.1 cm TV x 2.5 cm AP x approximately 16.0 cm CC, new in the interim. There is no definite peripheral enhancement. There is no definite dural tear, though evaluation is degraded by susceptibility artifact. The fluid collection appears to extend to the overlying skin surface which may correspond to the reported drainage. There is small layering fluid level within the collection which may reflect blood products (7-39). Disc levels: There is multilevel disc desiccation and narrowing in the midthoracic spine. C7-T1: There is a disc bulge extending into both neural foramina resulting in moderate left and mild right neural foraminal stenosis without significant spinal canal stenosis T1-T2: Mild disc bulge without significant spinal canal or neural foraminal stenosis. T2-T3: Mild disc bulge and mild bilateral facet arthropathy without significant spinal canal or neural foraminal stenosis. T3-T4: Small left foraminal protrusion and mild facet arthropathy without significant spinal canal or neural foraminal stenosis T4-T5 through T8-T9: Mild facet arthropathy without significant spinal canal or neural foraminal stenosis. T9-T10: There is a mild disc bulge and mild bilateral facet arthropathy resulting in mild left worse than right facet arthropathy without significant spinal canal stenosis. T10-T11: Suboptimally evaluated due to susceptibility artifact from the hardware. Mild disc bulge without significant spinal canal or neural foraminal stenosis T11-T12: Suboptimally evaluated due to susceptibility artifact. No significant spinal canal or right neural foraminal stenosis. The left neural foramen is not well assessed. MRI LUMBAR SPINE FINDINGS Segmentation: Standard; the lowest formed disc space is designated L5-S1. Alignment:  Grade 1 anterolisthesis of L3 on L4 is unchanged. Vertebrae: The patient is status post posterior fusion from  T10 through the pelvis with interval superior extension of the hardware construct as above, previously extending to the L2 level. The L2 pedicle screws appear to has been removed. Susceptibility artifact from the hardware degrades evaluation of the vertebral bodies; however, there is no definite acute or suspicious marrow signal abnormality. Conus medullaris: The conus medullaris and cauda equina nerve roots are not well delineated due to significant susceptibility artifact from the hardware.  Paraspinal and other soft tissues: As above, there is a large fluid collection in the soft tissues posterior to the surgical levels. There is no definite peripheral enhancement. There is no definite dural tear or connection with the spinal canal; however, evaluation is degraded by susceptibility artifact. Disc levels: T12-L1: Status post posterior instrumented fusion. No definite spinal canal or neural foraminal stenosis. L1-L2: Status post posterior instrumented fusion. There is a mild disc bulge contributing to probable mild right neural foraminal stenosis. No definite spinal canal or left neural foraminal stenosis L2-L3: Status post posterior instrumented fusion with interbody spacer placement. The spacer appears located posteriorly and to the right within the disc space and contributes to persistent severe spinal canal stenosis. At least moderate bilateral neural foraminal stenosis. Findings are grossly similar to the prior MRI. L3-L4: Status post posterior fusion. There is unchanged grade 1 anterolisthesis without significant spinal canal or neural foraminal stenosis L4-L5: Status post posterior fusion. There is endplate spurring without significant spinal canal or neural foraminal stenosis, unchanged L5-S1: Status post posterior fusion with interbody spacer placement. No significant residual spinal canal or neural foraminal stenosis. IMPRESSION: 1. Postsurgical changes reflecting posterior fusion from T10 through the  sacrum with interval superior extension of the fusion construct since the lumbar spine MRI from 01/17/2022. There is a large fluid collection in the soft tissues posterior to the surgical levels which extends to the overlying skin surface, possibly corresponding to the reported drainage. This may reflect a postoperative seroma, though abscess is not excluded by imaging. CSF leak is also a differential consideration. No definite dural tear is seen though evaluation is degraded by artifact. 2. Persistent severe spinal canal stenosis and at least moderate bilateral neural foraminal stenosis at L2-L3, not significantly changed compared to the prior MRI. 3. Degenerative changes throughout the remainder of the thoracic and lumbar spine as above without other high-grade spinal canal or neural foraminal stenosis. Electronically Signed   By: Lesia Hausen M.D.   On: 08/16/2022 10:47   MR THORACIC SPINE W WO CONTRAST  Result Date: 08/16/2022 CLINICAL DATA:  History of back surgery three days ago with drainage and bleeding. EXAM: MRI THORACIC AND LUMBAR SPINE WITHOUT AND WITH CONTRAST TECHNIQUE: Multiplanar and multiecho pulse sequences of the thoracic and lumbar spine were obtained without and with intravenous contrast. CONTRAST:  10mL GADAVIST GADOBUTROL 1 MMOL/ML IV SOLN COMPARISON:  Lumbar spine MRI 01/17/2022, thoracic spine CT 07/20/2020 FINDINGS: MRI THORACIC SPINE FINDINGS Postsurgical changes are noted in the cervical spine on the count sequence, incompletely evaluated. Alignment: There is no significant antero or retrolisthesis. There is mild levocurvature centered at T5-T6. Vertebrae: Vertebral body heights are preserved. Background marrow signal is normal. There is no suspicious marrow signal abnormality, marrow edema, or marrow enhancement. There is no evidence of discitis/osteomyelitis. There is no evidence of epidural fluid collection, though evaluation of the spinal canal below the T9 level is limited due to  artifact from the hardware. Postsurgical changes reflecting posterior fusion extending from T10 through the pelvis are noted, with interval superior extension of the fusion construct since the lumbar spine MRI from 01/17/2022. Cord: Normal in signal and morphology. There is no definite abnormal cord enhancement, though the cord below T9 is suboptimally evaluated due to artifact from the hardware. Paraspinal and other soft tissues: There is a fluid collection in the soft tissues posterior to the surgical levels measuring 5.1 cm TV x 2.5 cm AP x approximately 16.0 cm CC, new in the interim. There is no  definite peripheral enhancement. There is no definite dural tear, though evaluation is degraded by susceptibility artifact. The fluid collection appears to extend to the overlying skin surface which may correspond to the reported drainage. There is small layering fluid level within the collection which may reflect blood products (7-39). Disc levels: There is multilevel disc desiccation and narrowing in the midthoracic spine. C7-T1: There is a disc bulge extending into both neural foramina resulting in moderate left and mild right neural foraminal stenosis without significant spinal canal stenosis T1-T2: Mild disc bulge without significant spinal canal or neural foraminal stenosis. T2-T3: Mild disc bulge and mild bilateral facet arthropathy without significant spinal canal or neural foraminal stenosis. T3-T4: Small left foraminal protrusion and mild facet arthropathy without significant spinal canal or neural foraminal stenosis T4-T5 through T8-T9: Mild facet arthropathy without significant spinal canal or neural foraminal stenosis. T9-T10: There is a mild disc bulge and mild bilateral facet arthropathy resulting in mild left worse than right facet arthropathy without significant spinal canal stenosis. T10-T11: Suboptimally evaluated due to susceptibility artifact from the hardware. Mild disc bulge without significant  spinal canal or neural foraminal stenosis T11-T12: Suboptimally evaluated due to susceptibility artifact. No significant spinal canal or right neural foraminal stenosis. The left neural foramen is not well assessed. MRI LUMBAR SPINE FINDINGS Segmentation: Standard; the lowest formed disc space is designated L5-S1. Alignment:  Grade 1 anterolisthesis of L3 on L4 is unchanged. Vertebrae: The patient is status post posterior fusion from T10 through the pelvis with interval superior extension of the hardware construct as above, previously extending to the L2 level. The L2 pedicle screws appear to has been removed. Susceptibility artifact from the hardware degrades evaluation of the vertebral bodies; however, there is no definite acute or suspicious marrow signal abnormality. Conus medullaris: The conus medullaris and cauda equina nerve roots are not well delineated due to significant susceptibility artifact from the hardware. Paraspinal and other soft tissues: As above, there is a large fluid collection in the soft tissues posterior to the surgical levels. There is no definite peripheral enhancement. There is no definite dural tear or connection with the spinal canal; however, evaluation is degraded by susceptibility artifact. Disc levels: T12-L1: Status post posterior instrumented fusion. No definite spinal canal or neural foraminal stenosis. L1-L2: Status post posterior instrumented fusion. There is a mild disc bulge contributing to probable mild right neural foraminal stenosis. No definite spinal canal or left neural foraminal stenosis L2-L3: Status post posterior instrumented fusion with interbody spacer placement. The spacer appears located posteriorly and to the right within the disc space and contributes to persistent severe spinal canal stenosis. At least moderate bilateral neural foraminal stenosis. Findings are grossly similar to the prior MRI. L3-L4: Status post posterior fusion. There is unchanged grade 1  anterolisthesis without significant spinal canal or neural foraminal stenosis L4-L5: Status post posterior fusion. There is endplate spurring without significant spinal canal or neural foraminal stenosis, unchanged L5-S1: Status post posterior fusion with interbody spacer placement. No significant residual spinal canal or neural foraminal stenosis. IMPRESSION: 1. Postsurgical changes reflecting posterior fusion from T10 through the sacrum with interval superior extension of the fusion construct since the lumbar spine MRI from 01/17/2022. There is a large fluid collection in the soft tissues posterior to the surgical levels which extends to the overlying skin surface, possibly corresponding to the reported drainage. This may reflect a postoperative seroma, though abscess is not excluded by imaging. CSF leak is also a differential consideration. No definite dural tear  is seen though evaluation is degraded by artifact. 2. Persistent severe spinal canal stenosis and at least moderate bilateral neural foraminal stenosis at L2-L3, not significantly changed compared to the prior MRI. 3. Degenerative changes throughout the remainder of the thoracic and lumbar spine as above without other high-grade spinal canal or neural foraminal stenosis. Electronically Signed   By: Lesia Hausen M.D.   On: 08/16/2022 10:47    ED Course / MDM   Clinical Course as of 09/13/22 1542  Thu Sep 13, 2022  1514 See cohen for spinal scoliosis, spinal surgery 1 mo ago, had recurrent back pain, had fluid collection, back last night for worsening back pain, was initally hypotensive to 80s, got sepsis workup, expanding fluid collection, CSF leak, infected hematoma, hypotension resolved, but AKI, dimer elevated no DVT US, VQ scan normal   [facility], [facility], [facility] [place] [LS]  1529 Spoke with Dr. Noel Gerold at [facility], states that he feels that it is primarily a medical problem and not a neurosurgical  problems, recommends transfer to medical service [LS]    Clinical Course User Index [LS] Curley Spice, MD    Care assumed from previous provider at shift change.  See note for full HPI.  Patient 67 year old followed by Dr. Noel Gerold with spine scoliosis specialist in [facility] with recent back surgery here for evaluation of back pain.  Was noted to be hypotensive with AKI.  Sepsis protocol started.  Unclear source.  Currently afebrile.  Is getting IV fluids for hypotension.  Labs and imaging personally viewed and interpreted:  New creatinine 3.01 up from baseline around 1 UA negative for infection CBC without leukocytosis Lactic 1.5 D-dimer 3.23 Troponin negative for DVT bilaterally Chest x-ray without significant findings CT chest, renal study without significant findings VQ scan without deficit, low suspicion for PE MRI thoracic and lumbar shows extensive enlarging fluid collection unsure postop seroma, hematoma, infectious process, CSF leak  Patient reassessed.  Patient blood pressures have normalized.  She feels improved with her pain.  Will plan on touching base with Dr. Wells Guiles group for evaluation at [facility].  She needs to be admitted for sepsis rule out, AKI.  Care transferred to oncoming provider who will plan on following up with Dr.Cohen As well as admission  The patient appears reasonably stabilized for admission considering the current resources, flow, and capabilities available in the ED at this time, and I doubt any other [facility] requiring further screening and/or treatment in the ED prior to admission.    Medical Decision Making Amount and/or Complexity of Data Reviewed External Data Reviewed: labs, radiology, ECG and notes. Labs: ordered. Decision-making details documented in ED Course. Radiology: ordered and independent interpretation performed. Decision-making details documented in ED Course. ECG/medicine tests: ordered and independent interpretation performed.  Decision-making details documented in ED Course.  Risk OTC drugs. Prescription drug management. Parenteral controlled substances. Decision regarding hospitalization. Diagnosis or treatment significantly limited by social determinants of health.    Sepsis AKI Postoperative complication Abnormal MRI spine      Burnis Halling A, PA-C 09/13/22 1542    Arby Barrette, MD 09/13/22 1557

## 2022-09-13 NOTE — Progress Notes (Signed)
BLE venous duplex has been completed.   Results can be found under chart review under CV PROC. 09/13/2022 11:44 AM Matalynn Graff RVT, RDMS

## 2022-09-13 NOTE — ED Notes (Signed)
Call[facility] to speak to doctor max cohen for transport to [facility].

## 2022-09-14 DIAGNOSIS — N179 Acute kidney failure, unspecified: Secondary | ICD-10-CM | POA: Diagnosis not present

## 2022-09-14 LAB — BASIC METABOLIC PANEL
Anion gap: 8 (ref 5–15)
BUN: 35 mg/dL — ABNORMAL HIGH (ref 8–23)
CO2: 20 mmol/L — ABNORMAL LOW (ref 22–32)
Calcium: 8.7 mg/dL — ABNORMAL LOW (ref 8.9–10.3)
Chloride: 107 mmol/L (ref 98–111)
Creatinine, Ser: 1.42 mg/dL — ABNORMAL HIGH (ref 0.44–1.00)
GFR, Estimated: 41 mL/min — ABNORMAL LOW (ref >60)
Glucose, Bld: 97 mg/dL (ref 70–99)
Potassium: 4.3 mmol/L (ref 3.5–5.1)
Sodium: 135 mmol/L (ref 135–145)

## 2022-09-14 LAB — CBC
HCT: 25.2 % — ABNORMAL LOW (ref 36.0–46.0)
Hemoglobin: 8.1 g/dL — ABNORMAL LOW (ref 12.0–15.0)
MCH: 24.5 pg — ABNORMAL LOW (ref 26.0–34.0)
MCHC: 32.1 g/dL (ref 30.0–36.0)
MCV: 76.1 fL — ABNORMAL LOW (ref 80.0–100.0)
Platelets: 239 10*3/uL (ref 150–400)
RBC: 3.31 MIL/uL — ABNORMAL LOW (ref 3.87–5.11)
RDW: 14.6 % (ref 11.5–15.5)
WBC: 5.9 10*3/uL (ref 4.0–10.5)
nRBC: 0 % (ref 0.0–0.2)

## 2022-09-14 MED ORDER — HYDROMORPHONE HCL 1 MG/ML IJ SOLN
1.0000 mg | Freq: Once | INTRAMUSCULAR | Status: AC
  Administered 2022-09-14: 1 mg via INTRAVENOUS
  Filled 2022-09-14: qty 1

## 2022-09-14 NOTE — ED Provider Notes (Signed)
  Physical Exam  BP (!) 159/97   Pulse 81   Temp 98.5 F (36.9 C)   Resp 17   Ht  (1.702 m)   Wt 99.8 kg   SpO2 95%   BMI 34.46 kg/m   Physical Exam Constitutional:      General: She is not in acute distress.    Appearance: Normal appearance. She is not toxic-appearing.  Eyes:     General: No scleral icterus. Pulmonary:     Effort: Pulmonary effort is normal. No respiratory distress.  Musculoskeletal:     Comments: Wiggling her toes  Neurological:     Mental Status: She is alert.  Psychiatric:        Mood and Affect: Mood normal.     Procedures  Procedures  ED Course / MDM   Clinical Course as of 09/14/22 0418  Thu Sep 13, 2022  1514 See [facility] for spinal scoliosis, spinal surgery 1 mo ago, had recurrent back pain, had fluid collection, back last night for worsening back pain, was initally hypotensive to 80s, got sepsis workup, expanding fluid collection, CSF leak, infected hematoma, hypotension resolved, but AKI, dimer elevated no DVT US, VQ scan normal   [facility], [facility] [place] [facility] [place] [LS]  1529 Spoke with Dr. Noel Gerold at [facility], states that he feels that it is primarily a medical problem and not a neurosurgical problems, recommends transfer to medical service [LS]  1904 Talked w anette w [facility] [LS]  1945 [place],  [LS]    Clinical Course User Index [LS] Curley Spice, MD   Medical Decision Making Amount and/or Complexity of Data Reviewed Labs: ordered. Radiology: ordered.  Risk Prescription drug management.   Accepted handoff at shift change from Curley Spice, MD. Please see prior provider note for more detail.   Briefly: Patient is 67 y.o. F here for back pain. MRI show worsening fluid collection. Needs to be sent to [facility].  Plan:  Await transfer to [facility]. Pain management.   Dilaudid given for pain.   4:20 AM Patient is being taken by [employer]  Team to [facility]. EMTALA filled out by attending. Patient appears to be in no acute distress.      Achille Rich, PA-C 09/14/22 0421    Gilda Crease, MD 09/14/22 [id number]    Gilda Crease, MD 09/19/22 [phone]

## 2022-09-18 LAB — CULTURE, BLOOD (ROUTINE X 2): Culture: NO GROWTH

## 2022-09-19 ENCOUNTER — Other Ambulatory Visit: Payer: Self-pay

## 2022-09-19 DIAGNOSIS — I10 Essential (primary) hypertension: Secondary | ICD-10-CM

## 2022-09-19 MED ORDER — CLONIDINE HCL 0.2 MG PO TABS
0.2000 mg | ORAL_TABLET | Freq: Two times a day (BID) | ORAL | 2 refills | Status: DC
Start: 2022-09-19 — End: 2022-11-19

## 2022-11-11 ENCOUNTER — Inpatient Hospital Stay (HOSPITAL_COMMUNITY)
Admission: EM | Admit: 2022-11-11 | Discharge: 2022-11-14 | DRG: 871 | Disposition: A | Discharge status: Home / Self Care | Payer: Medicare HMO | Attending: Pulmonary Disease | Admitting: Pulmonary Disease

## 2022-11-11 ENCOUNTER — Inpatient Hospital Stay (HOSPITAL_COMMUNITY): Payer: Medicare HMO

## 2022-11-11 ENCOUNTER — Emergency Department (HOSPITAL_COMMUNITY): Payer: Medicare HMO

## 2022-11-11 DIAGNOSIS — N179 Acute kidney failure, unspecified: Secondary | ICD-10-CM | POA: Diagnosis present

## 2022-11-11 DIAGNOSIS — E876 Hypokalemia: Secondary | ICD-10-CM | POA: Diagnosis not present

## 2022-11-11 DIAGNOSIS — Z8249 Family history of ischemic heart disease and other diseases of the circulatory system: Secondary | ICD-10-CM

## 2022-11-11 DIAGNOSIS — E1122 Type 2 diabetes mellitus with diabetic chronic kidney disease: Secondary | ICD-10-CM | POA: Diagnosis present

## 2022-11-11 DIAGNOSIS — E785 Hyperlipidemia, unspecified: Secondary | ICD-10-CM | POA: Diagnosis present

## 2022-11-11 DIAGNOSIS — E871 Hypo-osmolality and hyponatremia: Secondary | ICD-10-CM | POA: Diagnosis present

## 2022-11-11 DIAGNOSIS — G928 Other toxic encephalopathy: Secondary | ICD-10-CM | POA: Diagnosis present

## 2022-11-11 DIAGNOSIS — Z79891 Long term (current) use of opiate analgesic: Secondary | ICD-10-CM

## 2022-11-11 DIAGNOSIS — Z833 Family history of diabetes mellitus: Secondary | ICD-10-CM

## 2022-11-11 DIAGNOSIS — Z6832 Body mass index (BMI) 32.0-32.9, adult: Secondary | ICD-10-CM

## 2022-11-11 DIAGNOSIS — J439 Emphysema, unspecified: Secondary | ICD-10-CM | POA: Diagnosis present

## 2022-11-11 DIAGNOSIS — R0902 Hypoxemia: Secondary | ICD-10-CM | POA: Diagnosis present

## 2022-11-11 DIAGNOSIS — N1832 Chronic kidney disease, stage 3b: Secondary | ICD-10-CM | POA: Diagnosis present

## 2022-11-11 DIAGNOSIS — M419 Scoliosis, unspecified: Secondary | ICD-10-CM | POA: Diagnosis present

## 2022-11-11 DIAGNOSIS — R571 Hypovolemic shock: Principal | ICD-10-CM | POA: Diagnosis present

## 2022-11-11 DIAGNOSIS — I471 Supraventricular tachycardia, unspecified: Secondary | ICD-10-CM | POA: Diagnosis not present

## 2022-11-11 DIAGNOSIS — I4891 Unspecified atrial fibrillation: Secondary | ICD-10-CM | POA: Diagnosis present

## 2022-11-11 DIAGNOSIS — Z806 Family history of leukemia: Secondary | ICD-10-CM

## 2022-11-11 DIAGNOSIS — H269 Unspecified cataract: Secondary | ICD-10-CM | POA: Diagnosis present

## 2022-11-11 DIAGNOSIS — Z888 Allergy status to other drugs, medicaments and biological substances status: Secondary | ICD-10-CM

## 2022-11-11 DIAGNOSIS — Z1152 Encounter for screening for COVID-19: Secondary | ICD-10-CM

## 2022-11-11 DIAGNOSIS — I129 Hypertensive chronic kidney disease with stage 1 through stage 4 chronic kidney disease, or unspecified chronic kidney disease: Secondary | ICD-10-CM | POA: Diagnosis present

## 2022-11-11 DIAGNOSIS — E669 Obesity, unspecified: Secondary | ICD-10-CM | POA: Diagnosis present

## 2022-11-11 DIAGNOSIS — F32A Depression, unspecified: Secondary | ICD-10-CM | POA: Diagnosis present

## 2022-11-11 DIAGNOSIS — R6521 Severe sepsis with septic shock: Secondary | ICD-10-CM | POA: Diagnosis not present

## 2022-11-11 DIAGNOSIS — E872 Acidosis, unspecified: Secondary | ICD-10-CM | POA: Diagnosis present

## 2022-11-11 DIAGNOSIS — Z79899 Other long term (current) drug therapy: Secondary | ICD-10-CM

## 2022-11-11 DIAGNOSIS — Z794 Long term (current) use of insulin: Secondary | ICD-10-CM

## 2022-11-11 DIAGNOSIS — E1136 Type 2 diabetes mellitus with diabetic cataract: Secondary | ICD-10-CM | POA: Diagnosis present

## 2022-11-11 DIAGNOSIS — E0590 Thyrotoxicosis, unspecified without thyrotoxic crisis or storm: Secondary | ICD-10-CM

## 2022-11-11 DIAGNOSIS — H3530 Unspecified macular degeneration: Secondary | ICD-10-CM | POA: Diagnosis present

## 2022-11-11 DIAGNOSIS — Z7984 Long term (current) use of oral hypoglycemic drugs: Secondary | ICD-10-CM

## 2022-11-11 DIAGNOSIS — E875 Hyperkalemia: Secondary | ICD-10-CM | POA: Diagnosis present

## 2022-11-11 DIAGNOSIS — G8929 Other chronic pain: Secondary | ICD-10-CM | POA: Diagnosis present

## 2022-11-11 DIAGNOSIS — Z981 Arthrodesis status: Secondary | ICD-10-CM

## 2022-11-11 DIAGNOSIS — E860 Dehydration: Secondary | ICD-10-CM | POA: Diagnosis present

## 2022-11-11 DIAGNOSIS — E039 Hypothyroidism, unspecified: Secondary | ICD-10-CM | POA: Diagnosis present

## 2022-11-11 DIAGNOSIS — F17210 Nicotine dependence, cigarettes, uncomplicated: Secondary | ICD-10-CM | POA: Diagnosis present

## 2022-11-11 DIAGNOSIS — K219 Gastro-esophageal reflux disease without esophagitis: Secondary | ICD-10-CM | POA: Diagnosis present

## 2022-11-11 DIAGNOSIS — T40495A Adverse effect of other synthetic narcotics, initial encounter: Secondary | ICD-10-CM | POA: Diagnosis present

## 2022-11-11 DIAGNOSIS — M797 Fibromyalgia: Secondary | ICD-10-CM | POA: Diagnosis present

## 2022-11-11 DIAGNOSIS — E861 Hypovolemia: Secondary | ICD-10-CM | POA: Diagnosis present

## 2022-11-11 DIAGNOSIS — A419 Sepsis, unspecified organism: Secondary | ICD-10-CM | POA: Diagnosis not present

## 2022-11-11 DIAGNOSIS — Z90711 Acquired absence of uterus with remaining cervical stump: Secondary | ICD-10-CM

## 2022-11-11 DIAGNOSIS — D509 Iron deficiency anemia, unspecified: Secondary | ICD-10-CM | POA: Diagnosis present

## 2022-11-11 DIAGNOSIS — G9340 Encephalopathy, unspecified: Secondary | ICD-10-CM

## 2022-11-11 LAB — RAPID URINE DRUG SCREEN, HOSP PERFORMED
Amphetamines: NOT DETECTED
Barbiturates: NOT DETECTED
Benzodiazepines: NOT DETECTED
Cocaine: NOT DETECTED
Opiates: NOT DETECTED
Tetrahydrocannabinol: NOT DETECTED

## 2022-11-11 LAB — CBC WITH DIFFERENTIAL/PLATELET
Abs Immature Granulocytes: 0.04 10*3/uL (ref 0.00–0.07)
Basophils Absolute: 0 10*3/uL (ref 0.0–0.1)
Basophils Relative: 0 %
Eosinophils Absolute: 0.1 10*3/uL (ref 0.0–0.5)
Eosinophils Relative: 1 %
HCT: 24.6 % — ABNORMAL LOW (ref 36.0–46.0)
Hemoglobin: 7.6 g/dL — ABNORMAL LOW (ref 12.0–15.0)
Immature Granulocytes: 1 %
Lymphocytes Relative: 31 %
Lymphs Abs: 2.7 10*3/uL (ref 0.7–4.0)
MCH: 23.1 pg — ABNORMAL LOW (ref 26.0–34.0)
MCHC: 30.9 g/dL (ref 30.0–36.0)
MCV: 74.8 fL — ABNORMAL LOW (ref 80.0–100.0)
Monocytes Absolute: 1 10*3/uL (ref 0.1–1.0)
Monocytes Relative: 12 %
Neutro Abs: 4.8 10*3/uL (ref 1.7–7.7)
Neutrophils Relative %: 55 %
Platelets: 291 10*3/uL (ref 150–400)
RBC: 3.29 MIL/uL — ABNORMAL LOW (ref 3.87–5.11)
RDW: 17.2 % — ABNORMAL HIGH (ref 11.5–15.5)
WBC: 8.7 10*3/uL (ref 4.0–10.5)
nRBC: 0 % (ref 0.0–0.2)

## 2022-11-11 LAB — TSH: TSH: 0.538 u[IU]/mL (ref 0.350–4.500)

## 2022-11-11 LAB — AMMONIA: Ammonia: 11 umol/L (ref 9–35)

## 2022-11-11 LAB — URINALYSIS, W/ REFLEX TO CULTURE (INFECTION SUSPECTED)
Bilirubin Urine: NEGATIVE
Glucose, UA: NEGATIVE mg/dL
Hgb urine dipstick: NEGATIVE
Ketones, ur: NEGATIVE mg/dL
Leukocytes,Ua: NEGATIVE
Nitrite: NEGATIVE
Protein, ur: NEGATIVE mg/dL
Specific Gravity, Urine: 1.009 (ref 1.005–1.030)
pH: 5 (ref 5.0–8.0)

## 2022-11-11 LAB — COMPREHENSIVE METABOLIC PANEL
ALT: 22 U/L (ref 0–44)
AST: 29 U/L (ref 15–41)
Albumin: 3.4 g/dL — ABNORMAL LOW (ref 3.5–5.0)
Alkaline Phosphatase: 80 U/L (ref 38–126)
Anion gap: 12 (ref 5–15)
BUN: 68 mg/dL — ABNORMAL HIGH (ref 8–23)
CO2: 16 mmol/L — ABNORMAL LOW (ref 22–32)
Calcium: 8.9 mg/dL (ref 8.9–10.3)
Chloride: 100 mmol/L (ref 98–111)
Creatinine, Ser: 4.31 mg/dL — ABNORMAL HIGH (ref 0.44–1.00)
GFR, Estimated: 11 mL/min — ABNORMAL LOW (ref >60)
Glucose, Bld: 103 mg/dL — ABNORMAL HIGH (ref 70–99)
Potassium: 5.3 mmol/L — ABNORMAL HIGH (ref 3.5–5.1)
Sodium: 128 mmol/L — ABNORMAL LOW (ref 135–145)
Total Bilirubin: 0.7 mg/dL (ref 0.3–1.2)
Total Protein: 6.5 g/dL (ref 6.5–8.1)

## 2022-11-11 LAB — I-STAT VENOUS BLOOD GAS, ED
Acid-base deficit: 9 mmol/L — ABNORMAL HIGH (ref 0.0–2.0)
Bicarbonate: 17.6 mmol/L — ABNORMAL LOW (ref 20.0–28.0)
Calcium, Ion: 1.17 mmol/L (ref 1.15–1.40)
HCT: 30 % — ABNORMAL LOW (ref 36.0–46.0)
Hemoglobin: 10.2 g/dL — ABNORMAL LOW (ref 12.0–15.0)
O2 Saturation: 82 %
Potassium: 5.1 mmol/L (ref 3.5–5.1)
Sodium: 131 mmol/L — ABNORMAL LOW (ref 135–145)
TCO2: 19 mmol/L — ABNORMAL LOW (ref 22–32)
pCO2, Ven: 39.2 mmHg — ABNORMAL LOW (ref 44–60)
pH, Ven: 7.26 (ref 7.25–7.43)
pO2, Ven: 53 mmHg — ABNORMAL HIGH (ref 32–45)

## 2022-11-11 LAB — CREATININE, URINE, RANDOM: Creatinine, Urine: 42 mg/dL

## 2022-11-11 LAB — PROTIME-INR
INR: 1.2 (ref 0.8–1.2)
Prothrombin Time: 15 seconds (ref 11.4–15.2)

## 2022-11-11 LAB — MRSA NEXT GEN BY PCR, NASAL: MRSA by PCR Next Gen: NOT DETECTED

## 2022-11-11 LAB — CBG MONITORING, ED: Glucose-Capillary: 103 mg/dL — ABNORMAL HIGH (ref 70–99)

## 2022-11-11 LAB — OSMOLALITY, URINE: Osmolality, Ur: 145 mOsm/kg — ABNORMAL LOW (ref 300–900)

## 2022-11-11 LAB — GLUCOSE, CAPILLARY
Glucose-Capillary: 116 mg/dL — ABNORMAL HIGH (ref 70–99)
Glucose-Capillary: 124 mg/dL — ABNORMAL HIGH (ref 70–99)

## 2022-11-11 LAB — SARS CORONAVIRUS 2 BY RT PCR: SARS Coronavirus 2 by RT PCR: NEGATIVE

## 2022-11-11 LAB — APTT: aPTT: 27 seconds (ref 24–36)

## 2022-11-11 MED ORDER — SODIUM CHLORIDE 0.9 % IV BOLUS: 1000.0000 mL | Freq: Once | INTRAVENOUS | Status: DC

## 2022-11-11 MED ORDER — VANCOMYCIN HCL 2000 MG/400ML IV SOLN
2000.0000 mg | Freq: Once | INTRAVENOUS | Status: AC
  Administered 2022-11-11: 2000 mg via INTRAVENOUS
  Filled 2022-11-11: qty 400

## 2022-11-11 MED ORDER — SODIUM CHLORIDE 0.9 % IV BOLUS
1000.0000 mL | Freq: Once | INTRAVENOUS | Status: AC
  Administered 2022-11-11: 1000 mL via INTRAVENOUS

## 2022-11-11 MED ORDER — NALOXONE HCL 0.4 MG/ML IJ SOLN: 0.4000 mg | INTRAMUSCULAR | Status: DC | PRN

## 2022-11-11 MED ORDER — CHLORHEXIDINE GLUCONATE CLOTH 2 % EX PADS
6.0000 | MEDICATED_PAD | Freq: Every day | CUTANEOUS | Status: DC
  Administered 2022-11-11 – 2022-11-12 (×2): 6 via TOPICAL

## 2022-11-11 MED ORDER — SODIUM CHLORIDE 0.9 % IV SOLN: INTRAVENOUS | Status: DC

## 2022-11-11 MED ORDER — NOREPINEPHRINE 4 MG/250ML-% IV SOLN
0.0000 ug/min | INTRAVENOUS | Status: DC
  Administered 2022-11-11: 4 ug/min via INTRAVENOUS
  Filled 2022-11-11: qty 250

## 2022-11-11 MED ORDER — ORAL CARE MOUTH RINSE
15.0000 mL | OROMUCOSAL | Status: DC
  Administered 2022-11-12: 15 mL via OROMUCOSAL

## 2022-11-11 MED ORDER — POLYETHYLENE GLYCOL 3350 17 G PO PACK
17.0000 g | PACK | Freq: Every day | ORAL | Status: DC | PRN
  Administered 2022-11-12: 17 g via ORAL
  Filled 2022-11-11: qty 1

## 2022-11-11 MED ORDER — DOCUSATE SODIUM 100 MG PO CAPS: 100.0000 mg | ORAL_CAPSULE | Freq: Two times a day (BID) | ORAL | Status: DC | PRN

## 2022-11-11 MED ORDER — CALCIUM GLUCONATE-NACL 2-0.675 GM/100ML-% IV SOLN
2.0000 g | Freq: Once | INTRAVENOUS | Status: AC
  Administered 2022-11-11: 2000 mg via INTRAVENOUS
  Filled 2022-11-11: qty 100

## 2022-11-11 MED ORDER — SODIUM CHLORIDE 0.9 % IV BOLUS
2000.0000 mL | Freq: Once | INTRAVENOUS | Status: AC
  Administered 2022-11-11: 2000 mL via INTRAVENOUS

## 2022-11-11 MED ORDER — NOREPINEPHRINE 4 MG/250ML-% IV SOLN
2.0000 ug/min | INTRAVENOUS | Status: DC
  Administered 2022-11-11: 4 ug/min via INTRAVENOUS
  Administered 2022-11-12: 12 ug/min via INTRAVENOUS
  Filled 2022-11-11: qty 250

## 2022-11-11 MED ORDER — SODIUM CHLORIDE 0.9 % IV SOLN: 250.0000 mL | INTRAVENOUS | Status: DC

## 2022-11-11 MED ORDER — SODIUM CHLORIDE 0.9 % IV SOLN
2.0000 g | Freq: Once | INTRAVENOUS | Status: AC
  Administered 2022-11-11: 2 g via INTRAVENOUS
  Filled 2022-11-11: qty 20

## 2022-11-11 MED ORDER — HEPARIN SODIUM (PORCINE) 5000 UNIT/ML IJ SOLN
5000.0000 [IU] | Freq: Three times a day (TID) | INTRAMUSCULAR | Status: DC
  Administered 2022-11-11 – 2022-11-14 (×8): 5000 [IU] via SUBCUTANEOUS
  Filled 2022-11-11 (×6): qty 1

## 2022-11-11 MED ORDER — VANCOMYCIN VARIABLE DOSE PER UNSTABLE RENAL FUNCTION (PHARMACIST DOSING): Status: DC

## 2022-11-11 MED ORDER — ORAL CARE MOUTH RINSE: 15.0000 mL | OROMUCOSAL | Status: DC | PRN

## 2022-11-11 MED ORDER — NALOXONE HCL 0.4 MG/ML IJ SOLN
0.4000 mg | Freq: Once | INTRAMUSCULAR | Status: AC
  Administered 2022-11-11: 0.4 mg via INTRAVENOUS
  Filled 2022-11-11: qty 1

## 2022-11-11 MED ORDER — INSULIN ASPART 100 UNIT/ML IJ SOLN
0.0000 [IU] | INTRAMUSCULAR | Status: DC
  Administered 2022-11-13: 1 [IU] via SUBCUTANEOUS

## 2022-11-11 MED ORDER — METHIMAZOLE 5 MG PO TABS
15.0000 mg | ORAL_TABLET | Freq: Every day | ORAL | Status: DC
  Administered 2022-11-12 – 2022-11-14 (×3): 15 mg via ORAL
  Filled 2022-11-11: qty 1
  Filled 2022-11-11: qty 3
  Filled 2022-11-11: qty 1

## 2022-11-11 MED ORDER — PIPERACILLIN-TAZOBACTAM 3.375 G IVPB
3.3750 g | Freq: Two times a day (BID) | INTRAVENOUS | Status: DC
  Administered 2022-11-11 – 2022-11-12 (×3): 3.375 g via INTRAVENOUS
  Filled 2022-11-11 (×4): qty 50

## 2022-11-11 NOTE — ED Notes (Signed)
MD at bedside. 

## 2022-11-11 NOTE — ED Notes (Signed)
Temp foley inserted light yellow [id number]

## 2022-11-11 NOTE — ED Provider Notes (Signed)
[facility] EMERGENCY DEPARTMENT AT [facility] Provider Note   CSN: [id number] Arrival date & time: 11/11/22  1744     History  No chief complaint on file.   Brittany Carroll is a 67 y.o. female.  67 yo F with a chief complaints of lethargy.  This was noticed by the family.  This that she has not been right for the past couple days.  They tell me that this sometimes happens to her and typically she comes in when she is "really dehydrated "and tends to get better with IV fluids and then is able to go home.  They deny cough congestion or fever.  Tell me that she has chronic pain and takes Suboxone for this.  They are not sure that she is even been taking her normal amount they do not think that she is taking more than normal.  They deny any medications that she is taken over-the-counter or any illegal drugs.  They deny abdominal pain denies vomiting.  Rulon Eisenmenger she just has not been eating and drinking well for weeks but worsening over the past few days.        Home Medications Prior to Admission medications   Medication Sig Start Date End Date Taking? Authorizing Provider  amitriptyline (ELAVIL) 75 MG tablet Take 75 mg by mouth at bedtime. 11/15/20   [provider]  aspirin (ASPIRIN CHILDRENS) 81 MG chewable tablet Chew 1 tablet (81 mg total) by mouth daily. Patient not taking: Reported on 09/13/2022 06/26/22   Yates Decamp, MD  buPROPion (WELLBUTRIN XL) 150 MG 24 hr tablet Take 150 mg by mouth daily.    [provider]  cloNIDine (CATAPRES) 0.2 MG tablet Take 1 tablet (0.2 mg total) by mouth 2 (two) times daily. 09/19/22   Yates Decamp, MD  esomeprazole (NEXIUM) 40 MG capsule Take 40 mg by mouth daily as needed (acid reflux).    [provider]  HUMALOG KWIKPEN 200 UNIT/ML KwikPen Inject 5 Units into the skin 3 (three) times daily before meals. Pt stated she does 10 units 06/29/21   [provider]  hydrALAZINE (APRESOLINE) 50 MG tablet Take 1 tablet (50  mg total) by mouth 3 (three) times daily. 05/23/22 05/18/23  Yates Decamp, MD  insulin glargine (LANTUS SOLOSTAR) 100 UNIT/ML Solostar Pen Inject 12 Units into the skin at bedtime.    [provider]  JENTADUETO 2.09-998 MG TABS Take 1 tablet by mouth 2 (two) times daily. 11/09/20   [provider]  losartan (COZAAR) 100 MG tablet Take 1 tablet (100 mg total) by mouth daily. 01/20/22   Rai, Ripudeep K, MD  magnesium oxide (MAG-OX) 400 MG tablet Take 400 mg by mouth daily.    [provider]  methimazole (TAPAZOLE) 10 MG tablet Take 15 mg by mouth daily. Taking  1 1/2 tabs = 15 mg daily    [provider]  metoprolol succinate (TOPROL-XL) 50 MG 24 hr tablet Take 1 tablet (50 mg total) by mouth every evening. Take with or immediately following a meal. 01/26/22 01/21/23  Yates Decamp, MD  Oxycodone HCl 20 MG TABS Take 20 mg by mouth 4 (four) times daily. 05/30/19   [provider]  pregabalin (LYRICA) 200 MG capsule Take 200 mg by mouth 2 (two) times daily. 01/05/21   [provider]  rosuvastatin (CRESTOR) 10 MG tablet Take 1 tablet by mouth at bedtime.    [provider]      Allergies    Lisinopril  and Metformin and related    Review of Systems   Review of Systems  Physical Exam Updated Vital Signs BP (!) 80/53   Pulse 86   Temp 99.9 F (37.7 C) (Axillary)   Resp (!) 21   SpO2 100%  Physical Exam Vitals and nursing note reviewed.  Constitutional:      General: She is not in acute distress.    Appearance: She is well-developed. She is ill-appearing. She is not diaphoretic.  HENT:     Head: Normocephalic and atraumatic.  Eyes:     Pupils: Pupils are equal, round, and reactive to light.  Cardiovascular:     Rate and Rhythm: Normal rate and regular rhythm.     Heart sounds: No murmur heard.    No friction rub. No gallop.  Pulmonary:     Effort: Pulmonary effort is normal.     Breath sounds: No wheezing or rales.     Comments:  Respiratory rate is about 8 Abdominal:     General: There is no distension.     Palpations: Abdomen is soft.     Tenderness: There is no abdominal tenderness.  Musculoskeletal:        General: No tenderness.     Cervical back: Normal range of motion and neck supple.  Skin:    General: Skin is warm and dry.  Neurological:     Mental Status: She is alert and oriented to person, place, and time.  Psychiatric:        Behavior: Behavior normal.     ED Results / Procedures / Treatments   Labs (all labs ordered are listed, but only abnormal results are displayed) Labs Reviewed  COMPREHENSIVE METABOLIC PANEL - Abnormal; Notable for the following components:      Result Value   Sodium 128 (*)    Potassium 5.3 (*)    CO2 16 (*)    Glucose, Bld 103 (*)    BUN 68 (*)    Creatinine, Ser 4.31 (*)    Albumin 3.4 (*)    GFR, Estimated 11 (*)    All other components within normal limits  CBC WITH DIFFERENTIAL/PLATELET - Abnormal; Notable for the following components:   RBC 3.29 (*)    Hemoglobin 7.6 (*)    HCT 24.6 (*)    MCV 74.8 (*)    MCH 23.1 (*)    RDW 17.2 (*)    All other components within normal limits  CBG MONITORING, ED - Abnormal; Notable for the following components:   Glucose-Capillary 103 (*)    All other components within normal limits  I-STAT VENOUS BLOOD GAS, ED - Abnormal; Notable for the following components:   pCO2, Ven 39.2 (*)    pO2, Ven 53 (*)    Bicarbonate 17.6 (*)    TCO2 19 (*)    Acid-base deficit 9.0 (*)    Sodium 131 (*)    HCT 30.0 (*)    Hemoglobin 10.2 (*)    All other components within normal limits  CULTURE, BLOOD (ROUTINE X 2)  CULTURE, BLOOD (ROUTINE X 2)  PROTIME-INR  APTT  TSH  AMMONIA  LACTIC ACID, PLASMA  LACTIC ACID, PLASMA  URINALYSIS, W/ REFLEX TO CULTURE (INFECTION SUSPECTED)    EKG EKG Interpretation  Date/Time:  Sunday November 11 2022 18:00:03 EDT Ventricular Rate:  96 PR Interval:  163 QRS Duration: 83 QT  Interval:  340 QTC Calculation: 430 R Axis:   74 Text Interpretation: Sinus rhythm No significant change  since last tracing Confirmed by Melene Plan [phone]) on 11/11/2022 6:19:59 PM  Radiology DG Chest Port 1 View  Result Date: 11/11/2022 CLINICAL DATA:  Questionable sepsis - evaluate for abnormality EXAM: PORTABLE CHEST 1 VIEW COMPARISON:  09/13/2022 FINDINGS: Cardiomegaly, vascular congestion and interstitial prominence, likely interstitial edema. No effusions. No acute bony abnormality. IMPRESSION: Mild CHF. Electronically Signed   By: Charlett Nose M.D.   On: 11/11/2022 19:04    Procedures Ultrasound ED Echo  Date/Time: 11/11/2022 7:52 PM  Performed by: Melene Plan, DO Authorized by: Melene Plan, DO   Procedure details:    Indications: hypotension     Views: subxiphoid, apical 4 chamber view and IVC view     Images: archived     Limitations:  Body habitus Findings:    Pericardium: no pericardial effusion     LV Function: normal (>50% EF)     RV Diameter: normal     IVC: dilated   Impression:    Impression: normal and probable elevated CVP   .Critical Care  Performed by: Melene Plan, DO Authorized by: Melene Plan, DO   Critical care provider statement:    Critical care time (minutes):  80   Critical care time was exclusive of:  Separately billable procedures and treating other patients   Critical care was time spent personally by me on the following activities:  Development of treatment plan with patient or surrogate, discussions with consultants, evaluation of patient's response to treatment, examination of patient, ordering and review of laboratory studies, ordering and review of radiographic studies, ordering and performing treatments and interventions, pulse oximetry, re-evaluation of patient's condition and review of old charts   Care discussed with: admitting provider      Procedure note: Ultrasound Guided Peripheral IV Ultrasound guided peripheral 1.88 inch angiocath IV  placement performed by me. Indications: Nursing unable to place IV. Details: The antecubital fossa and upper arm were evaluated with a multifrequency linear probe. Patent brachial veins were noted. 1 attempt was made to cannulate a vein under realtime US guidance with successful cannulation of the vein and catheter placement. There is return of non-pulsatile dark red blood. The patient tolerated the procedure well without complications. Images archived electronically.  CPT codes: 78469 and [phone]    Medications Ordered in ED Medications  sodium chloride 0.9 % bolus 1,000 mL (1,000 mLs Intravenous Not Given 11/11/22 1819)  sodium chloride 0.9 % bolus 1,000 mL (1,000 mLs Intravenous Not Given 11/11/22 1819)  norepinephrine (LEVOPHED) 4mg  in (0.016 mg/mL) premix infusion (has no administration in time range)  0.9 %  sodium chloride infusion (has no administration in time range)  cefTRIAXone (ROCEPHIN) 2 g in sodium chloride 0.9 % 100 mL IVPB (has no administration in time range)  sodium chloride 0.9 % bolus 1,000 mL (0 mLs Intravenous Stopped 11/11/22 1845)  naloxone (NARCAN) injection 0.4 mg (0.4 mg Intravenous Given 11/11/22 1811)  sodium chloride 0.9 % bolus 2,000 mL (0 mLs Intravenous Stopped 11/11/22 1907)  sodium chloride 0.9 % bolus 1,000 mL (1,000 mLs Intravenous New Bag/Given 11/11/22 1908)    ED Course/ Medical Decision Making/ A&P                             Medical Decision Making Amount and/or Complexity of Data Reviewed Labs: ordered. Radiology: ordered. ECG/medicine tests: ordered.  Risk Prescription drug management.   67 yo F with a chief complaints of fatigue.  This has been going  on for a couple days.  Patient has been mostly at home.  They deny any obvious illness.  Family tells me this happens every now and again.  Usually is thought to be due to dehydration and she improved with IV fluids.  Blood pressure in the 50s on arrival.  IV access started on both extremities.   Will give 3 L of IV fluids.  Labs.  Chest x-ray UA.  Reassess.  She is responsive to pain for me.  She has a very low respiratory rate and so was given a small bolus of Narcan.  Had some improvement of her respiratory rate with this.  Patient has had some improvement with IV fluids.  Unfortunately maps have not reached 65 for any consistent amount of time.  Will start on Levophed.  Bedside ultrasound with dilated IVC without obvious respiratory variation.  Will continue IV fluid administration.  Lab work is resulted with significant AKI.  Mild hyperkalemia.  Hyponatremia.  Metabolic acidosis with anion gap.  Lactate for some reason has not yet resulted.  I suspect it is probably high.  Will cover with antibiotics.  The patients results and plan were reviewed and discussed.   Any x-rays performed were independently reviewed by myself.   Differential diagnosis were considered with the presenting HPI.  Medications  sodium chloride 0.9 % bolus 1,000 mL (1,000 mLs Intravenous Not Given 11/11/22 1819)  sodium chloride 0.9 % bolus 1,000 mL (1,000 mLs Intravenous Not Given 11/11/22 1819)  norepinephrine (LEVOPHED) 4mg  in (0.016 mg/mL) premix infusion (has no administration in time range)  0.9 %  sodium chloride infusion (has no administration in time range)  cefTRIAXone (ROCEPHIN) 2 g in sodium chloride 0.9 % 100 mL IVPB (has no administration in time range)  sodium chloride 0.9 % bolus 1,000 mL (0 mLs Intravenous Stopped 11/11/22 1845)  naloxone (NARCAN) injection 0.4 mg (0.4 mg Intravenous Given 11/11/22 1811)  sodium chloride 0.9 % bolus 2,000 mL (0 mLs Intravenous Stopped 11/11/22 1907)  sodium chloride 0.9 % bolus 1,000 mL (1,000 mLs Intravenous New Bag/Given 11/11/22 1908)    Vitals:   11/11/22 1846 11/11/22 1848 11/11/22 1900 11/11/22 1903  BP: (!) 79/46 (!) 76/50 (!) 70/41 (!) 80/53  Pulse: 85 85 85 86  Resp: (!) 23 (!) 24 11 (!) 21  Temp:      TempSrc:      SpO2: 100% 100% 100% 100%     Final diagnoses:  Hypotension due to hypovolemia    Admission/ observation were discussed with the admitting physician, patient and/or family and they are comfortable with the plan.          Final Clinical Impression(s) / ED Diagnoses Final diagnoses:  Hypotension due to hypovolemia    Rx / DC Orders ED Discharge Orders     None         Melene Plan, DO 11/11/22 1953

## 2022-11-11 NOTE — Progress Notes (Signed)
eLink Physician-Brief Progress Note Patient Name: Brittany Carroll DOB: [birth year] MRN: [id number]   Date of Service  11/11/2022  HPI/Events of Note  67 year old female with pertinent PMH chronic back pain (on Suboxone per family), depression, DMT2, HTN, HLD, hypothyroidism presents to [facility] ED on 6/16 with AMS and shock.  He is initially hypotensive requiring norepinephrine infusion, hypoxic requiring oxygen supplementation, and hypothermic.  Noted to have mild hyponatremia, none anion gap metabolic acidosis, and mild microcytic anemia.  Urinalysis is unremarkable.  Talk screen unremarkable.  Cultures been collected.  Thyroid function within normal limits.  Ammonia within normal limits.  Poor quality image but chest radiograph shows cardiomegaly with fluid overload.  CT head unremarkable.  eICU Interventions  Will consider repeat Narcan if worsening encephalopathy.  Broad-spectrum antibiotics until shock improved.   Maintain norepinephrine for MAP goal of 65.  Poor IV access, unable to draw labs.  Low threshold to place A-line given concurrent shock.  DVT prophylaxis with heparin.  GI prophylaxis not indicated.   0320 -currently maxed on peripheral norepinephrine and bicarb drip is running at 75 cc.  MAP of 60-increase peripheral dose norepinephrine as needed up to 20 mcg  0555 -patient had an IV infiltrate that was running norepinephrine at 12 mcg.  Has a secondary IV and since switching to the second IV, her norepinephrine requirements are drastically reduced-currently on 5 mcg.  For her infiltrated site, phentolamine and Nitropaste have been applied.  Continue to monitor for excoriation and breakdown.  Intervention Category Evaluation Type: New Patient Evaluation  [name] 11/11/2022, 10:31 PM

## 2022-11-11 NOTE — Plan of Care (Signed)
  Problem: Clinical Measurements: Goal: Ability to maintain clinical measurements within normal limits will improve Outcome: Progressing Goal: Will remain free from infection Outcome: Progressing Goal: Diagnostic test results will improve Outcome: Progressing   Problem: Education: Goal: Knowledge of General Education information will improve Description: Including pain rating scale, medication(s)/side effects and non-pharmacologic comfort measures Outcome: Not Progressing

## 2022-11-11 NOTE — Progress Notes (Signed)
Pharmacy Antibiotic Note  Brittany Carroll is a 67 y.o. female admitted on 11/11/2022 with sepsis.  Pharmacy has been consulted for vancomycin and zosyn dosing.Significant AKI.   Plan: Vancomycin 2g x1 then dose based on levels.  Zosyn 3.375 Q12H based on Lexicomp rec for CrCl < 80mL/min when utilizing extended infusion dosing.   Follow culture data for de-escalation.  Monitor renal function for dose adjustments as indicated.  Follow up MRSA swab   Weight: 99.8 kg (220 lb 0.3 oz)  Temp (24hrs), Avg:99.9 F (37.7 C), Min:99.9 F (37.7 C), Max:99.9 F (37.7 C)  Recent Labs  Lab 11/11/22 1819  WBC 8.7  CREATININE 4.31*    Estimated Creatinine Clearance: 15.4 mL/min (A) (by C-G formula based on SCr of 4.31 mg/dL (H)).    Allergies  Allergen Reactions   Lisinopril Hives   Metformin And Related Hives   Thank you for allowing pharmacy to be a part of this patient's care.  Estill Batten, PharmD, BCCCP  11/11/2022 9:36 PM

## 2022-11-11 NOTE — ED Notes (Signed)
Unsuccessful attempt to get the additional blood

## 2022-11-11 NOTE — ED Notes (Signed)
Pt did not respond to narcan.  

## 2022-11-11 NOTE — H&P (Signed)
NAME:  Brittany Carroll, MRN:  [id number], DOB:  10/25/1955, LOS: 0 ADMISSION DATE:  11/11/2022, CONSULTATION DATE:  6/16 REFERRING MD:  Dr. Adela Lank, CHIEF COMPLAINT:  shock; acute encephalopathy   History of Present Illness:  Patient is a 67 year old female with pertinent PMH chronic back pain (on Suboxone per family), depression, DMT2, HTN, HLD, hypothyroidism presents to [facility] ED on 6/16 with AMS.  Per family patient has been more altered the past couple days.  Patient has not been eating or drinking over the past few weeks.  On 6/16 patient with lethargy and came to [facility] ED for further eval.  [name] family states that she gets altered like this when she is really dehydrated and improved with IV fluids.  Family states she has not been having any fever or complaining of any abdominal pain, respiratory symptoms.  Also family has not seen patient take any over-the-counter or illegal drugs.  Upon arrival to [facility] ED on 6/16, patient hypotensive 66/43 and temp 99.9 F.  WBC 8.7.  CBG 103.  Patient lethargic but arousable.  Patient having low respiratory rate initially was given small bolus of Narcan with some improvement in RR.  Patient given IV fluids and cultures obtained.  LA and UA pending.  Started on Rocephin.  CXR showing interstitial edema; mild CHF.  Despite IV fluids patient remained hypotensive and started on levo.  PCCM consulted for ICU admission.  Pertinent ED labs: NA 128, K5.3, CO2 16, BUN 68, creatinine 4.31 (baseline around 1.4), Hgb 7.6, TSH and ammonia WNL, VBG 7.26, 39, 53, 17.  Pertinent  Medical History   Past Medical History:  Diagnosis Date   Cataract    OU   Diabetes mellitus without complication (HCC)    Fibromyalgia    History of atrial fibrillation without current medication    2008 in [place] had cardioversion   Hyperlipidemia    Hypertension    Macular degeneration      Significant Hospital Events: Including procedures, antibiotic start and stop dates in addition to  other pertinent events   6/16 patient admitted with AMS and hypotension; started on levo; PCCM consulted for admission  Interim History / Subjective:  See above  Objective   Blood pressure (!) 80/53, pulse 86, temperature 99.9 F (37.7 C), temperature source Axillary, resp. rate (!) 21, SpO2 100 %.       No intake or output data in the 24 hours ending 11/11/22 1956 There were no vitals filed for this visit.  Examination: General:   ill appearing female w/ lethargy HEENT: MM pink/moist; [place] in place Neuro: Lethargic but arousable to voice; MAE with symmetrical strength bilaterally CV: s1s2, RRR, no m/r/g PULM:  dim BS bilaterally; [place] 3L GI: soft, bsx4 active  Extremities: warm/dry, BLE edema  Skin: no rashes or lesions appreciated  Resolved Hospital Problem list     Assessment & Plan:  Shock: initially thought to be hypovolemic given patient with poor p.o. intake over the past few weeks; possible sepsis but currently no known source of infection Plan: -Admit to ICU -Started on levo for MAP goal greater than 65 -IV fluids given -Trend LA -check PCT and cortisol -Cultures and UA obtained -Will start on broad-spectrum antibiotics -Trend WBC/fever curve -check echo and bnp  Acute encephalopathy: on suboxone at home for chronic back pain from spinal scoliosis (recent lumbar spinal fusion march 2024); minimal response to narcan; possible uremia related -ammonia/tsh wnl Plan: -Obtain CT head -Limit sedating meds -Check UDS, UA, ethanol, acetaminophen,  salicylate level -consider repeat dose of narcan if worsening AMS  Hyponatremia: presumed hypovolemia Plan: -given IV fluids -trend bmp  AKI on CKD stage 3b Mild hyperkalemia Plan: -IV fluids given -place foley; monitor UOP -calcium ordered -Trend BMP -Replace electrolytes as indicated -Avoid nephrotoxic agents, ensure adequate renal perfusion  DMT2 Plan: -ssi and cbg monitoring -a1c  HTN HLD Prior afib:  not  on ac; on metoprolol Plan: -Hold antihypertensives given hypotension -Consider resuming home asa and statin when able to take p.o.  Chronic anemia Plan: -trend cbc  Chronic back pain: on suboxone at home for chronic back pain from spinal scoliosis (recent lumbar spinal fusion march 2024) Fibromyalgia Plan: -hold home meds given AMS  Depression Plan: -hold home meds  Hx of hyperthyroidism Plan: -resume home methimazole when more awake to take po  Tobacco abuse Plan: -hold wellbutrin  Best Practice (right click and "Reselect all SmartList Selections" daily)   Diet/type: NPO DVT prophylaxis: SCD; awaiting CT head results to rule out bleed before starting heparin subcu GI prophylaxis: N/A Lines: N/A Foley:  Yes, and it is still needed Code Status:  full code Last date of multidisciplinary goals of care discussion [6/16 spoke with patient's daughter Brittany Carroll over phone.  States she would like for patient to be full code.]  Labs   CBC: Recent Labs  Lab 11/11/22 1819 11/11/22 1846  WBC 8.7  --   NEUTROABS 4.8  --   HGB 7.6* 10.2*  HCT 24.6* 30.0*  MCV 74.8*  --   PLT 291  --     Basic Metabolic Panel: Recent Labs  Lab 11/11/22 1819 11/11/22 1846  NA 128* 131*  K 5.3* 5.1  CL 100  --   CO2 16*  --   GLUCOSE 103*  --   BUN 68*  --   CREATININE 4.31*  --   CALCIUM 8.9  --    GFR: CrCl cannot be calculated (Unknown ideal weight.). Recent Labs  Lab 11/11/22 1819  WBC 8.7    Liver Function Tests: Recent Labs  Lab 11/11/22 1819  AST 29  ALT 22  ALKPHOS 80  BILITOT 0.7  PROT 6.5  ALBUMIN 3.4*   No results for input(s): "LIPASE", "AMYLASE" in the last 168 hours. Recent Labs  Lab 11/11/22 1825  AMMONIA 11    ABG    Component Value Date/Time   HCO3 17.6 (L) 11/11/2022 1846   TCO2 19 (L) 11/11/2022 1846   ACIDBASEDEF 9.0 (H) 11/11/2022 1846   O2SAT 82 11/11/2022 1846     Coagulation Profile: Recent Labs  Lab 11/11/22 1819  INR 1.2     Cardiac Enzymes: No results for input(s): "CKTOTAL", "CKMB", "CKMBINDEX", "TROPONINI" in the last 168 hours.  HbA1C: Hgb A1c MFr Bld  Date/Time Value Ref Range Status  01/16/2022 03:13 AM 6.6 (H) 4.8 - 5.6 % Final    Comment:    (NOTE) Pre diabetes:          5.7%-6.4%  Diabetes:              >6.4%  Glycemic control for   <7.0% adults with diabetes     CBG: Recent Labs  Lab 11/11/22 1749  GLUCAP 103*    Review of Systems:   Patient is encephalopathic; therefore, history has been obtained from chart review.    Past Medical History:  She,  has a past medical history of Cataract, Diabetes mellitus without complication (HCC), Fibromyalgia, History of atrial fibrillation without current medication, Hyperlipidemia, Hypertension,  and Macular degeneration.   Surgical History:   Past Surgical History:  Procedure Laterality Date   CERVICAL FUSION     ECTOPIC PREGNANCY SURGERY     LUMBAR FUSION     PARTIAL HYSTERECTOMY     RENAL ANGIOGRAPHY N/A 10/03/2021   Procedure: RENAL ANGIOGRAPHY;  Surgeon: Yates Decamp, MD;  Location: [facility] INVASIVE [facility] LAB;  Service: Cardiovascular;  Laterality: N/A;     Social History:   reports that she has been smoking cigarettes. She has a 17.50 pack-year smoking history. She has never used smokeless tobacco. She reports that she does not currently use alcohol. She reports that she does not currently use drugs after having used the following drugs: Heroin, Cocaine, and Marijuana.   Family History:  Her family history includes Atrial fibrillation in her mother; Diabetes in her brother and brother; Hyperlipidemia in her brother, brother, and mother; Hypertension in her brother, brother, and mother; Leukemia in her father.   Allergies Allergies  Allergen Reactions   Lisinopril Hives   Metformin And Related Hives     Home Medications  Prior to Admission medications   Medication Sig Start Date End Date Taking? Authorizing Provider  amitriptyline  (ELAVIL) 75 MG tablet Take 75 mg by mouth at bedtime. 11/15/20   [provider]  aspirin (ASPIRIN CHILDRENS) 81 MG chewable tablet Chew 1 tablet (81 mg total) by mouth daily. Patient not taking: Reported on 09/13/2022 06/26/22   Yates Decamp, MD  buPROPion (WELLBUTRIN XL) 150 MG 24 hr tablet Take 150 mg by mouth daily.    [provider]  cloNIDine (CATAPRES) 0.2 MG tablet Take 1 tablet (0.2 mg total) by mouth 2 (two) times daily. 09/19/22   Yates Decamp, MD  esomeprazole (NEXIUM) 40 MG capsule Take 40 mg by mouth daily as needed (acid reflux).    [provider]  HUMALOG KWIKPEN 200 UNIT/ML KwikPen Inject 5 Units into the skin 3 (three) times daily before meals. Pt stated she does 10 units 06/29/21   [provider]  hydrALAZINE (APRESOLINE) 50 MG tablet Take 1 tablet (50 mg total) by mouth 3 (three) times daily. 05/23/22 05/18/23  Yates Decamp, MD  insulin glargine (LANTUS SOLOSTAR) 100 UNIT/ML Solostar Pen Inject 12 Units into the skin at bedtime.    [provider]  JENTADUETO 2.09-998 MG TABS Take 1 tablet by mouth 2 (two) times daily. 11/09/20   [provider]  losartan (COZAAR) 100 MG tablet Take 1 tablet (100 mg total) by mouth daily. 01/20/22   Rai, Ripudeep K, MD  magnesium oxide (MAG-OX) 400 MG tablet Take 400 mg by mouth daily.    [provider]  methimazole (TAPAZOLE) 10 MG tablet Take 15 mg by mouth daily. Taking  1 1/2 tabs = 15 mg daily    [provider]  metoprolol succinate (TOPROL-XL) 50 MG 24 hr tablet Take 1 tablet (50 mg total) by mouth every evening. Take with or immediately following a meal. 01/26/22 01/21/23  Yates Decamp, MD  Oxycodone HCl 20 MG TABS Take 20 mg by mouth 4 (four) times daily. 05/30/19   [provider]  pregabalin (LYRICA) 200 MG capsule Take 200 mg by mouth 2 (two) times daily. 01/05/21   [provider]  rosuvastatin (CRESTOR) 10 MG tablet Take 1 tablet by mouth at bedtime.    [provider]     Critical care time: 45 minutes    JD Daryel [facility] 11/11/2022, 7:56 PM  Please see [email or web address] for pager details.  From 7A-7P if no response, please call [phone]. After hours, please call ELink [phone].

## 2022-11-11 NOTE — ED Notes (Signed)
Called lab to check on status of blood work. Lab stated they would run pt's blood work.

## 2022-11-11 NOTE — ED Notes (Signed)
Attempted to call lab to check on status of pt's blood work with no answer.

## 2022-11-11 NOTE — ED Notes (Signed)
Report called to jennifer rn on 47m

## 2022-11-11 NOTE — ED Triage Notes (Signed)
Pt began feeling dehydrated & went to her PCP & whiel at [facility] for blood work she was unable to urinate & they could not draw blood. Pt's daughter reports that she has been decreasing in alertness & getting more & more lethargic since then. Upon arrival to ED via POV she was not able to stand & get out of vehicle & not stay alert or answer questions ... BP 66/43 in triage.

## 2022-11-11 NOTE — ED Notes (Signed)
Critical care at the bedside. 

## 2022-11-12 ENCOUNTER — Encounter (INDEPENDENT_AMBULATORY_CARE_PROVIDER_SITE_OTHER): Payer: Medicare HMO | Admitting: Ophthalmology

## 2022-11-12 ENCOUNTER — Inpatient Hospital Stay (HOSPITAL_COMMUNITY): Payer: Medicare HMO

## 2022-11-12 DIAGNOSIS — A419 Sepsis, unspecified organism: Secondary | ICD-10-CM | POA: Diagnosis not present

## 2022-11-12 DIAGNOSIS — E119 Type 2 diabetes mellitus without complications: Secondary | ICD-10-CM

## 2022-11-12 DIAGNOSIS — R571 Hypovolemic shock: Secondary | ICD-10-CM | POA: Diagnosis not present

## 2022-11-12 DIAGNOSIS — H25813 Combined forms of age-related cataract, bilateral: Secondary | ICD-10-CM

## 2022-11-12 DIAGNOSIS — R6521 Severe sepsis with septic shock: Secondary | ICD-10-CM | POA: Diagnosis not present

## 2022-11-12 DIAGNOSIS — I10 Essential (primary) hypertension: Secondary | ICD-10-CM

## 2022-11-12 DIAGNOSIS — H35033 Hypertensive retinopathy, bilateral: Secondary | ICD-10-CM

## 2022-11-12 LAB — RESPIRATORY PANEL BY PCR

## 2022-11-12 LAB — GLUCOSE, CAPILLARY
Glucose-Capillary: 97 mg/dL (ref 70–99)
Glucose-Capillary: 96 mg/dL (ref 70–99)
Glucose-Capillary: 139 mg/dL — ABNORMAL HIGH (ref 70–99)
Glucose-Capillary: 130 mg/dL — ABNORMAL HIGH (ref 70–99)
Glucose-Capillary: 134 mg/dL — ABNORMAL HIGH (ref 70–99)
Glucose-Capillary: 130 mg/dL — ABNORMAL HIGH (ref 70–99)

## 2022-11-12 LAB — BASIC METABOLIC PANEL
Anion gap: 13 (ref 5–15)
Anion gap: 15 (ref 5–15)
BUN: 56 mg/dL — ABNORMAL HIGH (ref 8–23)
BUN: 51 mg/dL — ABNORMAL HIGH (ref 8–23)
CO2: 14 mmol/L — ABNORMAL LOW (ref 22–32)
CO2: 17 mmol/L — ABNORMAL LOW (ref 22–32)
Calcium: 8.2 mg/dL — ABNORMAL LOW (ref 8.9–10.3)
Calcium: 8.6 mg/dL — ABNORMAL LOW (ref 8.9–10.3)
Chloride: 106 mmol/L (ref 98–111)
Chloride: 103 mmol/L (ref 98–111)
Creatinine, Ser: 3.5 mg/dL — ABNORMAL HIGH (ref 0.44–1.00)
Creatinine, Ser: 2.87 mg/dL — ABNORMAL HIGH (ref 0.44–1.00)
GFR, Estimated: 14 mL/min — ABNORMAL LOW (ref >60)
GFR, Estimated: 17 mL/min — ABNORMAL LOW (ref >60)
Glucose, Bld: 123 mg/dL — ABNORMAL HIGH (ref 70–99)
Glucose, Bld: 122 mg/dL — ABNORMAL HIGH (ref 70–99)
Potassium: 5.1 mmol/L (ref 3.5–5.1)
Potassium: 4.3 mmol/L (ref 3.5–5.1)
Sodium: 133 mmol/L — ABNORMAL LOW (ref 135–145)
Sodium: 135 mmol/L (ref 135–145)

## 2022-11-12 LAB — ECHOCARDIOGRAM COMPLETE
AR max vel: 1.44 cm2
AV Area VTI: 1.48 cm2
AV Area mean vel: 1.44 cm2
AV Mean grad: 10.5 mmHg
AV Peak grad: 19.4 mmHg
Ao pk vel: 2.2 m/s
Area-P 1/2: 2.7 cm2
S' Lateral: 2.4 cm
Weight: 3294.55 oz

## 2022-11-12 LAB — CBC
HCT: 23.6 % — ABNORMAL LOW (ref 36.0–46.0)
Hemoglobin: 7.2 g/dL — ABNORMAL LOW (ref 12.0–15.0)
MCH: 23.3 pg — ABNORMAL LOW (ref 26.0–34.0)
MCHC: 30.5 g/dL (ref 30.0–36.0)
MCV: 76.4 fL — ABNORMAL LOW (ref 80.0–100.0)
Platelets: 246 10*3/uL (ref 150–400)
RBC: 3.09 MIL/uL — ABNORMAL LOW (ref 3.87–5.11)
RDW: 17.4 % — ABNORMAL HIGH (ref 11.5–15.5)
WBC: 7.3 10*3/uL (ref 4.0–10.5)
nRBC: 0 % (ref 0.0–0.2)

## 2022-11-12 LAB — LACTIC ACID, PLASMA
Lactic Acid, Venous: 1 mmol/L (ref 0.5–1.9)
Lactic Acid, Venous: 0.9 mmol/L (ref 0.5–1.9)

## 2022-11-12 LAB — ETHANOL: Alcohol, Ethyl (B): <10 mg/dL (ref <10)

## 2022-11-12 LAB — PHOSPHORUS: Phosphorus: 4.2 mg/dL (ref 2.5–4.6)

## 2022-11-12 LAB — PROCALCITONIN: Procalcitonin: 0.66 ng/mL

## 2022-11-12 LAB — HEMOGLOBIN A1C
Hgb A1c MFr Bld: 5.8 % — ABNORMAL HIGH (ref 4.8–5.6)
Mean Plasma Glucose: 119.76 mg/dL

## 2022-11-12 LAB — OSMOLALITY: Osmolality: 299 mOsm/kg — ABNORMAL HIGH (ref 275–295)

## 2022-11-12 LAB — CORTISOL: Cortisol, Plasma: 5.5 ug/dL

## 2022-11-12 LAB — ACETAMINOPHEN LEVEL: Acetaminophen (Tylenol), Serum: <10 ug/mL — ABNORMAL LOW (ref 10–30)

## 2022-11-12 LAB — SALICYLATE LEVEL: Salicylate Lvl: <7 mg/dL — ABNORMAL LOW (ref 7.0–30.0)

## 2022-11-12 LAB — MAGNESIUM: Magnesium: 1.4 mg/dL — ABNORMAL LOW (ref 1.7–2.4)

## 2022-11-12 LAB — BRAIN NATRIURETIC PEPTIDE: B Natriuretic Peptide: 72 pg/mL (ref 0.0–100.0)

## 2022-11-12 MED ORDER — LACTATED RINGERS IV BOLUS
500.0000 mL | Freq: Once | INTRAVENOUS | Status: AC
  Administered 2022-11-12: 500 mL via INTRAVENOUS

## 2022-11-12 MED ORDER — NITROGLYCERIN 2 % TD OINT
1.0000 [in_us] | TOPICAL_OINTMENT | Freq: Three times a day (TID) | TRANSDERMAL | Status: DC
  Administered 2022-11-12 – 2022-11-13 (×3): 1 [in_us] via TOPICAL
  Filled 2022-11-12: qty 30

## 2022-11-12 MED ORDER — PHENTOLAMINE MESYLATE 5 MG IJ SOLR
5.0000 mg | Freq: Once | INTRAMUSCULAR | Status: AC
  Administered 2022-11-12: 5 mg via SUBCUTANEOUS
  Filled 2022-11-12: qty 5

## 2022-11-12 MED ORDER — ORAL CARE MOUTH RINSE: 15.0000 mL | OROMUCOSAL | Status: DC | PRN

## 2022-11-12 MED ORDER — MAGNESIUM SULFATE 2 GM/50ML IV SOLN
2.0000 g | Freq: Once | INTRAVENOUS | Status: AC
  Administered 2022-11-12: 2 g via INTRAVENOUS
  Filled 2022-11-12: qty 50

## 2022-11-12 MED ORDER — LIP MEDEX EX OINT
TOPICAL_OINTMENT | CUTANEOUS | Status: DC | PRN
  Filled 2022-11-12: qty 7

## 2022-11-12 MED ORDER — ACETAMINOPHEN 325 MG PO TABS
650.0000 mg | ORAL_TABLET | Freq: Four times a day (QID) | ORAL | Status: DC | PRN
  Administered 2022-11-12 – 2022-11-13 (×2): 650 mg via ORAL
  Filled 2022-11-12 (×2): qty 2

## 2022-11-12 MED ORDER — STERILE WATER FOR INJECTION IV SOLN
INTRAVENOUS | Status: DC
  Filled 2022-11-12 (×4): qty 1000

## 2022-11-12 MED ORDER — PHENTOLAMINE MESYLATE 5 MG IJ SOLR
5.0000 mg | Freq: Once | INTRAMUSCULAR | Status: DC
  Filled 2022-11-12: qty 5

## 2022-11-12 MED ORDER — SODIUM ZIRCONIUM CYCLOSILICATE 10 G PO PACK: 10.0000 g | PACK | Freq: Once | ORAL | Status: DC

## 2022-11-12 NOTE — Progress Notes (Signed)
Attempted ECHO; patient was just placed in the chair; nurse asked to come back later. Will attempt.  Dondra Prader RVT RCS

## 2022-11-12 NOTE — Progress Notes (Signed)
*  PRELIMINARY RESULTS* Echocardiogram 2D Echocardiogram has been performed.  Laddie Aquas 11/12/2022, 2:15 PM

## 2022-11-12 NOTE — Progress Notes (Signed)
Pt BP soft.  Pt currently on levo through U/S guided PIV with site watch in place.  Elink made aware of soft BP and Levo order modified to max of .  Called elink RN to confirm change in order change since Levo going through PIV and elink RN stated elink MD states ok to use increased amount through PIV.

## 2022-11-12 NOTE — Progress Notes (Signed)
An USGPIV (ultrasound guided PIV) has been placed for short-term vasopressor infusion. A correctly placed ivWatch must be used when administering Vasopressors. Should this treatment be needed beyond 72 hours, central line access should be obtained.  It will be the responsibility of the bedside nurse to follow best practice to prevent extravasations. An USGPIV (ultrasound guided PIV) has been placed for short-term vasopressor infusion. A correctly placed ivWatch must be used when administering Vasopressors. Should this treatment be needed beyond 72 hours, central line access should be obtained.  It will be the responsibility of the bedside nurse to follow best practice to prevent extravasations. An USGPIV (ultrasound guided PIV) has been placed for short-term vasopressor infusion. A correctly placed ivWatch must be used when administering Vasopressors. Should this treatment be needed beyond 72 hours, central line access should be obtained.  It will be the responsibility of the bedside nurse to follow best practice to prevent extravasations.

## 2022-11-12 NOTE — Progress Notes (Signed)
NAME:  Brittany Carroll, MRN:  [id number], DOB:  05-11-1956, LOS: 1 ADMISSION DATE:  11/11/2022, CONSULTATION DATE:  6/16 REFERRING MD:  Dr. Adela Carroll, CHIEF COMPLAINT:  shock; acute encephalopathy   History of Present Illness:  Patient is a 67 year old female with pertinent PMH chronic back pain (on Suboxone per family), depression, DMT2, HTN, HLD, hypothyroidism presents to [facility] ED on 6/16 with AMS.  Per family patient has been more altered the past couple days.  Patient has not been eating or drinking over the past few weeks.  On 6/16 patient with lethargy and came to [facility] ED for further eval.  [name] family states that she gets altered like this when she is really dehydrated and improved with IV fluids.  Family states she has not been having any fever or complaining of any abdominal pain, respiratory symptoms.  Also family has not seen patient take any over-the-counter or illegal drugs.  Upon arrival to [facility] ED on 6/16, patient hypotensive 66/43 and temp 99.9 F.  WBC 8.7.  CBG 103.  Patient lethargic but arousable.  Patient having low respiratory rate initially was given small bolus of Narcan with some improvement in RR.  Patient given IV fluids and cultures obtained.  LA and UA pending.  Started on Rocephin.  CXR showing interstitial edema; mild CHF.  Despite IV fluids patient remained hypotensive and started on levo.  PCCM consulted for ICU admission.  Pertinent ED labs: NA 128, K5.3, CO2 16, BUN 68, creatinine 4.31 (baseline around 1.4), Hgb 7.6, TSH and ammonia WNL, VBG 7.26, 39, 53, 17.  Pertinent  Medical History   Past Medical History:  Diagnosis Date   Cataract    OU   Diabetes mellitus without complication (HCC)    Fibromyalgia    History of atrial fibrillation without current medication    2008 in [place] had cardioversion   Hyperlipidemia    Hypertension    Macular degeneration    Significant Hospital Events: Including procedures, antibiotic start and stop dates in addition to other  pertinent events   6/16 patient admitted with AMS and hypotension; started on levo; PCCM consulted for admission 6/17- off levophed  Interim History / Subjective:  Awake alert interactive Denies any pain or discomfort Has not been coughing Denies urinary symptoms Right foot discomfort with swelling  Objective   Blood pressure 132/62, pulse 73, temperature (!) 96.4 F (35.8 C), temperature source Bladder, resp. rate 20, weight 93.4 kg, SpO2 98 %.        Intake/Output Summary (Last 24 hours) at 11/12/2022 0834 Last data filed at 11/12/2022 0800 Gross per 24 hour  Intake 1622.27 ml  Output 1850 ml  Net -227.73 ml   Filed Weights   11/11/22 2100 11/11/22 2230 11/12/22 0500  Weight: 99.8 kg 93.4 kg 93.4 kg    Examination: General: Middle-age lady, does not appear to be in distress HEENT: Moist oral mucosa Neuro: Awake alert interactive, no focal findings  CV: S1-S2 appreciated PULM: Clear breath sounds GI: Soft, bowel sounds appreciated Extremities: Warm and dry, dorsum of the foot on the right has some swelling, no significant warmth Skin: no rashes or lesions appreciated  Resolved Hospital Problem list     Assessment & Plan:   Hypovolemic shock Does not appear to have a source of infection that is identifiable at present No leukocytosis -Chest x-ray does not showed new infiltrate, has not been coughing, no symptoms of UTI, no abdominal pain or bowel changes -Only admitted to right foot swelling -Follow  cultures -Discontinue vancomycin -Will anticipate being able to discontinue Zosyn by 618 -Empirically will transition to possibly Keflex for possible cellulitis right foot in a.m. if cultures remain negative -Continue IV fluids - Encephalopathy -Likely multifactorial -Normally on Suboxone for chronic back pain spinal scoliosis, recent spinal fusion -Only had minimal response to Narcan -CT head negative -Drug screen negative  Electrolyte derangement  hyponatremia -Hyponatremia-follow closely -Hypokalemia-treated  Acute kidney injury on chronic kidney disease stage IIIb -Avoid nephrotoxic medication -Maintain renal perfusion -Trend electrolytes -Repeat BMP at noon today  Type 2 diabetes -Continue SSI  Hypertension Hyperlipidemia -Hold home medications  Chronic anemia -Trend  Prior history of A-fib -On metoprolol, on hold at present  History of fibromyalgia, depression -Hold home medications that may contribute to lethargy  Chronic back pain -Was on Suboxone  History of hypothyroidism -On methimazole  History of tobacco abuse -Hold Wellbutrin  Emphysema on CT chest -Was not on any inhalers -No PFT on record  Clonidine patch inpatient, patch removed -Prescribed by pain doctor for headaches -Only started using this this month -This may have been contributing to the hypotension as patient has a prescription for oral clonidine as well  Best Practice (right click and "Reselect all SmartList Selections" daily)   Diet/type: Regular consistency (see orders) DVT prophylaxis: SCD; awaiting CT head results to rule out bleed before starting heparin subcu GI prophylaxis: N/A Lines: N/A Foley:  removal ordered  Code Status:  full code Last date of multidisciplinary goals of care discussion [6/16 spoke with patient's daughter Brittany Carroll over phone.  States she would like for patient to be full code.]  Labs   CBC: Recent Labs  Lab 11/11/22 1819 11/11/22 1846 11/12/22 0205  WBC 8.7  --  7.3  NEUTROABS 4.8  --   --   HGB 7.6* 10.2* 7.2*  HCT 24.6* 30.0* 23.6*  MCV 74.8*  --  76.4*  PLT 291  --  246    Basic Metabolic Panel: Recent Labs  Lab 11/11/22 1819 11/11/22 1846 11/12/22 0127  NA 128* 131* 133*  K 5.3* 5.1 5.1  CL 100  --  106  CO2 16*  --  14*  GLUCOSE 103*  --  123*  BUN 68*  --  56*  CREATININE 4.31*  --  3.50*  CALCIUM 8.9  --  8.2*  MG  --   --  1.4*  PHOS  --   --  4.2   GFR: Estimated  Creatinine Clearance: 18.3 mL/min (A) (by C-G formula based on SCr of 3.5 mg/dL (H)). Recent Labs  Lab 11/11/22 1819 11/12/22 0127 11/12/22 0205  PROCALCITON  --  0.66  --   WBC 8.7  --  7.3  LATICACIDVEN  --  1.0 0.9    Liver Function Tests: Recent Labs  Lab 11/11/22 1819  AST 29  ALT 22  ALKPHOS 80  BILITOT 0.7  PROT 6.5  ALBUMIN 3.4*   No results for input(s): "LIPASE", "AMYLASE" in the last 168 hours. Recent Labs  Lab 11/11/22 1825  AMMONIA 11    ABG    Component Value Date/Time   HCO3 17.6 (L) 11/11/2022 1846   TCO2 19 (L) 11/11/2022 1846   ACIDBASEDEF 9.0 (H) 11/11/2022 1846   O2SAT 82 11/11/2022 1846     Coagulation Profile: Recent Labs  Lab 11/11/22 1819  INR 1.2    Cardiac Enzymes: No results for input(s): "CKTOTAL", "CKMB", "CKMBINDEX", "TROPONINI" in the last 168 hours.  HbA1C: Hgb A1c MFr Bld  Date/Time  Value Ref Range Status  11/12/2022 02:05 AM 5.8 (H) 4.8 - 5.6 % Final    Comment:    (NOTE) Pre diabetes:          5.7%-6.4%  Diabetes:              >6.4%  Glycemic control for   <7.0% adults with diabetes   01/16/2022 03:13 AM 6.6 (H) 4.8 - 5.6 % Final    Comment:    (NOTE) Pre diabetes:          5.7%-6.4%  Diabetes:              >6.4%  Glycemic control for   <7.0% adults with diabetes     CBG: Recent Labs  Lab 11/11/22 1749 11/11/22 2220 11/11/22 2343 11/12/22 0331 11/12/22 0737  GLUCAP 103* 124* 116* 97 96    Review of Systems:   Patient is encephalopathic; therefore, history has been obtained from chart review.    Past Medical History:  She,  has a past medical history of Cataract, Diabetes mellitus without complication (HCC), Fibromyalgia, History of atrial fibrillation without current medication, Hyperlipidemia, Hypertension, and Macular degeneration.   Surgical History:   Past Surgical History:  Procedure Laterality Date   CERVICAL FUSION     ECTOPIC PREGNANCY SURGERY     LUMBAR FUSION     PARTIAL  HYSTERECTOMY     RENAL ANGIOGRAPHY N/A 10/03/2021   Procedure: RENAL ANGIOGRAPHY;  Surgeon: Yates Decamp, MD;  Location: [facility] INVASIVE CV LAB;  Service: Cardiovascular;  Laterality: N/A;     Social History:   reports that she has been smoking cigarettes. She has a 17.50 pack-year smoking history. She has never used smokeless tobacco. She reports that she does not currently use alcohol. She reports that she does not currently use drugs after having used the following drugs: Heroin, Cocaine, and Marijuana.   Family History:  Her family history includes Atrial fibrillation in her mother; Diabetes in her brother and brother; Hyperlipidemia in her brother, brother, and mother; Hypertension in her brother, brother, and mother; Leukemia in her father.   Allergies Allergies  Allergen Reactions   Lisinopril Hives   Metformin And Related Hives    The patient is critically ill with multiple organ systems failure and requires high complexity decision making for assessment and support, frequent evaluation and titration of therapies, application of advanced monitoring technologies and extensive interpretation of multiple databases. Critical Care Time devoted to patient care services described in this note independent of APP/resident time (if applicable)  is 33 minutes.   Virl Diamond MD [place] [facility] Personal pager: See Amion If unanswered, please page CCM On-call: #[phone]

## 2022-11-12 NOTE — Progress Notes (Signed)
Pt right FA U/S guided IV with the levo gtt running to it was noted to have swelling and pain.  Levo gtt stopped from that site, IV was removed and RUE elevated.  Elink made aware as well as pharmacy.  Orders received for phentolamine extravasation kit.  IV watch was in place but did not alarm.

## 2022-11-13 DIAGNOSIS — A419 Sepsis, unspecified organism: Secondary | ICD-10-CM | POA: Diagnosis not present

## 2022-11-13 DIAGNOSIS — R6521 Severe sepsis with septic shock: Secondary | ICD-10-CM | POA: Diagnosis not present

## 2022-11-13 LAB — GLUCOSE, CAPILLARY
Glucose-Capillary: 136 mg/dL — ABNORMAL HIGH (ref 70–99)
Glucose-Capillary: 101 mg/dL — ABNORMAL HIGH (ref 70–99)
Glucose-Capillary: 151 mg/dL — ABNORMAL HIGH (ref 70–99)
Glucose-Capillary: 129 mg/dL — ABNORMAL HIGH (ref 70–99)
Glucose-Capillary: 137 mg/dL — ABNORMAL HIGH (ref 70–99)
Glucose-Capillary: 134 mg/dL — ABNORMAL HIGH (ref 70–99)

## 2022-11-13 LAB — CBC
HCT: 22.1 % — ABNORMAL LOW (ref 36.0–46.0)
Hemoglobin: 7 g/dL — ABNORMAL LOW (ref 12.0–15.0)
MCH: 23.4 pg — ABNORMAL LOW (ref 26.0–34.0)
MCHC: 31.7 g/dL (ref 30.0–36.0)
MCV: 73.9 fL — ABNORMAL LOW (ref 80.0–100.0)
Platelets: 234 10*3/uL (ref 150–400)
RBC: 2.99 MIL/uL — ABNORMAL LOW (ref 3.87–5.11)
RDW: 17.3 % — ABNORMAL HIGH (ref 11.5–15.5)
WBC: 5.4 10*3/uL (ref 4.0–10.5)
nRBC: 0 % (ref 0.0–0.2)

## 2022-11-13 LAB — CULTURE, BLOOD (ROUTINE X 2): Culture: NO GROWTH

## 2022-11-13 LAB — BASIC METABOLIC PANEL
Anion gap: 8 (ref 5–15)
BUN: 36 mg/dL — ABNORMAL HIGH (ref 8–23)
CO2: 24 mmol/L (ref 22–32)
Calcium: 8.1 mg/dL — ABNORMAL LOW (ref 8.9–10.3)
Chloride: 103 mmol/L (ref 98–111)
Creatinine, Ser: 1.64 mg/dL — ABNORMAL HIGH (ref 0.44–1.00)
GFR, Estimated: 34 mL/min — ABNORMAL LOW (ref >60)
Glucose, Bld: 134 mg/dL — ABNORMAL HIGH (ref 70–99)
Potassium: 3.9 mmol/L (ref 3.5–5.1)
Sodium: 135 mmol/L (ref 135–145)

## 2022-11-13 LAB — MAGNESIUM
Magnesium: 1.3 mg/dL — ABNORMAL LOW (ref 1.7–2.4)
Magnesium: 2.1 mg/dL (ref 1.7–2.4)

## 2022-11-13 MED ORDER — LOSARTAN POTASSIUM 50 MG PO TABS
100.0000 mg | ORAL_TABLET | Freq: Every day | ORAL | Status: DC
  Administered 2022-11-13 – 2022-11-14 (×2): 100 mg via ORAL
  Filled 2022-11-13 (×2): qty 2

## 2022-11-13 MED ORDER — AMITRIPTYLINE HCL 50 MG PO TABS
75.0000 mg | ORAL_TABLET | Freq: Every day | ORAL | Status: DC
  Administered 2022-11-13: 75 mg via ORAL
  Filled 2022-11-13: qty 2

## 2022-11-13 MED ORDER — ROSUVASTATIN CALCIUM 5 MG PO TABS
10.0000 mg | ORAL_TABLET | Freq: Every day | ORAL | Status: DC
  Administered 2022-11-13: 10 mg via ORAL
  Filled 2022-11-13: qty 2

## 2022-11-13 MED ORDER — METOPROLOL TARTRATE 5 MG/5ML IV SOLN
INTRAVENOUS | Status: AC
  Filled 2022-11-13: qty 5

## 2022-11-13 MED ORDER — METOPROLOL SUCCINATE ER 50 MG PO TB24
50.0000 mg | ORAL_TABLET | Freq: Every day | ORAL | Status: DC
  Administered 2022-11-13: 50 mg via ORAL
  Filled 2022-11-13 (×2): qty 1

## 2022-11-13 MED ORDER — METOPROLOL TARTRATE 5 MG/5ML IV SOLN
5.0000 mg | INTRAVENOUS | Status: DC | PRN
  Administered 2022-11-13: 5 mg via INTRAVENOUS

## 2022-11-13 MED ORDER — HYDRALAZINE HCL 50 MG PO TABS
50.0000 mg | ORAL_TABLET | Freq: Three times a day (TID) | ORAL | Status: DC
  Administered 2022-11-13 – 2022-11-14 (×3): 50 mg via ORAL
  Filled 2022-11-13 (×3): qty 1

## 2022-11-13 MED ORDER — BUPRENORPHINE HCL-NALOXONE HCL 8-2 MG SL SUBL
2.0000 | SUBLINGUAL_TABLET | Freq: Every day | SUBLINGUAL | Status: DC
  Administered 2022-11-13 – 2022-11-14 (×2): 2 via SUBLINGUAL
  Filled 2022-11-13 (×2): qty 2

## 2022-11-13 MED ORDER — LACTATED RINGERS IV BOLUS
500.0000 mL | Freq: Once | INTRAVENOUS | Status: AC
  Administered 2022-11-13: 500 mL via INTRAVENOUS

## 2022-11-13 MED ORDER — MAGNESIUM SULFATE 4 GM/100ML IV SOLN
4.0000 g | Freq: Once | INTRAVENOUS | Status: AC
  Administered 2022-11-13: 4 g via INTRAVENOUS
  Filled 2022-11-13: qty 100

## 2022-11-13 MED ORDER — PREGABALIN 100 MG PO CAPS
200.0000 mg | ORAL_CAPSULE | Freq: Two times a day (BID) | ORAL | Status: DC
  Administered 2022-11-13 – 2022-11-14 (×3): 200 mg via ORAL
  Filled 2022-11-13: qty 2
  Filled 2022-11-13: qty 4
  Filled 2022-11-13: qty 2

## 2022-11-13 MED ORDER — METOPROLOL TARTRATE 25 MG PO TABS
50.0000 mg | ORAL_TABLET | Freq: Once | ORAL | Status: AC
  Administered 2022-11-13: 50 mg via ORAL
  Filled 2022-11-13: qty 2

## 2022-11-13 MED ORDER — CEPHALEXIN 500 MG PO CAPS
500.0000 mg | ORAL_CAPSULE | Freq: Four times a day (QID) | ORAL | Status: DC
  Administered 2022-11-13 – 2022-11-14 (×5): 500 mg via ORAL
  Filled 2022-11-13 (×6): qty 1

## 2022-11-13 MED ORDER — MAGNESIUM SULFATE 2 GM/50ML IV SOLN
2.0000 g | Freq: Once | INTRAVENOUS | Status: AC
  Administered 2022-11-13: 2 g via INTRAVENOUS
  Filled 2022-11-13: qty 50

## 2022-11-13 NOTE — Plan of Care (Signed)
  Problem: Education: Goal: Knowledge of General Education information will improve Description: Including pain rating scale, medication(s)/side effects and non-pharmacologic comfort measures Outcome: Completed/Met   

## 2022-11-13 NOTE — Progress Notes (Signed)
Home BP meds reinitiated

## 2022-11-13 NOTE — Progress Notes (Addendum)
NAME:  Brittany Carroll, MRN:  [id number], DOB:  April 10, 1956, LOS: 2 ADMISSION DATE:  11/11/2022, CONSULTATION DATE:  6/16 REFERRING MD:  Dr. Adela Lank, CHIEF COMPLAINT:  shock; acute encephalopathy   History of Present Illness:  Patient is a 67 year old female with pertinent PMH chronic back pain (on Suboxone per family), depression, DMT2, HTN, HLD, hypothyroidism presents to [facility] ED on 6/16 with AMS.  Per family patient has been more altered the past couple days.  Patient has not been eating or drinking over the past few weeks.  On 6/16 patient with lethargy and came to [facility] ED for further eval.  [name] family states that she gets altered like this when she is really dehydrated and improved with IV fluids.  Family states she has not been having any fever or complaining of any abdominal pain, respiratory symptoms.  Also family has not seen patient take any over-the-counter or illegal drugs.  Upon arrival to [facility] ED on 6/16, patient hypotensive 66/43 and temp 99.9 F.  WBC 8.7.  CBG 103.  Patient lethargic but arousable.  Patient having low respiratory rate initially was given small bolus of Narcan with some improvement in RR.  Patient given IV fluids and cultures obtained.  LA and UA pending.  Started on Rocephin.  CXR showing interstitial edema; mild CHF.  Despite IV fluids patient remained hypotensive and started on levo.  PCCM consulted for ICU admission.  Pertinent ED labs: NA 128, K5.3, CO2 16, BUN 68, creatinine 4.31 (baseline around 1.4), Hgb 7.6, TSH and ammonia WNL, VBG 7.26, 39, 53, 17.  Pertinent  Medical History   Past Medical History:  Diagnosis Date   Cataract    OU   Diabetes mellitus without complication (HCC)    Fibromyalgia    History of atrial fibrillation without current medication    2008 in [place] had cardioversion   Hyperlipidemia    Hypertension    Macular degeneration    Significant Hospital Events: Including procedures, antibiotic start and stop dates in addition to other  pertinent events   6/16 patient admitted with AMS and hypotension; started on levo; PCCM consulted for admission 6/17- off levophed  Interim History / Subjective:  Awake alert interactive Has not required pressors Feels overall better  Objective   Blood pressure (!) 144/74, pulse 82, temperature 98.5 F (36.9 C), temperature source Oral, resp. rate (!) 8, weight 93.4 kg, SpO2 97 %.        Intake/Output Summary (Last 24 hours) at 11/13/2022 7829 Last data filed at 11/13/2022 0800 Gross per 24 hour  Intake 3519.15 ml  Output 975 ml  Net 2544.15 ml   Filed Weights   11/11/22 2230 11/12/22 0500 11/13/22 0500  Weight: 93.4 kg 93.4 kg 93.4 kg    Examination: Elderly lady, moist oral mucosa Clear breath sounds S1-S2 appreciated Bowel sounds appreciated Skin is warm and dry Swelling of the dorsum of her right foot   Resolved Hospital Problem list     Assessment & Plan:   Hypovolemic shock Does not appear to have source of significant infection although does have right dorsum of the foot swelling, discomfort, does not appear warmer than the other foot Cultures negative -Vancomycin discontinued -Will discontinue Zosyn -Keflex for 7 days  Encephalopathy -Multifactorial CT head negative Drug screen negative This has improved  Electrolyte derangement including hypomagnesemia -Being repleted  Acute kidney injury with improvement Acute kidney injury on chronic kidney disease stage IIIb -Maintain renal perfusion -Continue to trend electrolytes  Type 2 diabetes -  Continue SSI  Hypertension Hyperlipidemia -Continue to hold home medications  Chronic anemia Trend  History of atrial fibrillation -Metoprolol was on hold -Will reinitiate metoprolol  History of fibromyalgia, depression -Reinitiate home Lyrica  Chronic pain -Start sublingual Suboxone  History of hypothyroidism -On methimazole  History of tobacco abuse -Hold Wellbutrin  Emphysema on CT  chest -Was not on any inhalers -No PFT on record  Clonidine patch inpatient, patch removed -Prescribed by pain doctor for headaches -Only started using this this month -This may have been contributing to the hypotension as patient has a prescription for oral clonidine as well  Transfer to medical floor  Best Practice (right click and "Reselect all SmartList Selections" daily)   Diet/type: Regular consistency (see orders) DVT prophylaxis: SCD; awaiting CT head results to rule out bleed before starting heparin subcu GI prophylaxis: N/A Lines: N/A Foley:  removal ordered  Code Status:  full code Last date of multidisciplinary goals of care discussion [6/16 spoke with patient's daughter Elmarie Shiley over phone.  States she would like for patient to be full code.]  Labs   CBC: Recent Labs  Lab 11/11/22 1819 11/11/22 1846 11/12/22 0205 11/13/22 0808  WBC 8.7  --  7.3 5.4  NEUTROABS 4.8  --   --   --   HGB 7.6* 10.2* 7.2* 7.0*  HCT 24.6* 30.0* 23.6* 22.1*  MCV 74.8*  --  76.4* 73.9*  PLT 291  --  246 234    Basic Metabolic Panel: Recent Labs  Lab 11/11/22 1819 11/11/22 1846 11/12/22 0127 11/12/22 1211 11/13/22 0808  NA 128* 131* 133* 135 135  K 5.3* 5.1 5.1 4.3 3.9  CL 100  --  106 103 103  CO2 16*  --  14* 17* 24  GLUCOSE 103*  --  123* 122* 134*  BUN 68*  --  56* 51* 36*  CREATININE 4.31*  --  3.50* 2.87* 1.64*  CALCIUM 8.9  --  8.2* 8.6* 8.1*  MG  --   --  1.4*  --  1.3*  PHOS  --   --  4.2  --   --    GFR: Estimated Creatinine Clearance: 39 mL/min (A) (by C-G formula based on SCr of 1.64 mg/dL (H)). Recent Labs  Lab 11/11/22 1819 11/12/22 0127 11/12/22 0205 11/13/22 0808  PROCALCITON  --  0.66  --   --   WBC 8.7  --  7.3 5.4  LATICACIDVEN  --  1.0 0.9  --     Liver Function Tests: Recent Labs  Lab 11/11/22 1819  AST 29  ALT 22  ALKPHOS 80  BILITOT 0.7  PROT 6.5  ALBUMIN 3.4*   No results for input(s): "LIPASE", "AMYLASE" in the last 168  hours. Recent Labs  Lab 11/11/22 1825  AMMONIA 11    ABG    Component Value Date/Time   HCO3 17.6 (L) 11/11/2022 1846   TCO2 19 (L) 11/11/2022 1846   ACIDBASEDEF 9.0 (H) 11/11/2022 1846   O2SAT 82 11/11/2022 1846     Coagulation Profile: Recent Labs  Lab 11/11/22 1819  INR 1.2    Cardiac Enzymes: No results for input(s): "CKTOTAL", "CKMB", "CKMBINDEX", "TROPONINI" in the last 168 hours.  HbA1C: Hgb A1c MFr Bld  Date/Time Value Ref Range Status  11/12/2022 02:05 AM 5.8 (H) 4.8 - 5.6 % Final    Comment:    (NOTE) Pre diabetes:          5.7%-6.4%  Diabetes:              >  6.4%  Glycemic control for   <7.0% adults with diabetes   01/16/2022 03:13 AM 6.6 (H) 4.8 - 5.6 % Final    Comment:    (NOTE) Pre diabetes:          5.7%-6.4%  Diabetes:              >6.4%  Glycemic control for   <7.0% adults with diabetes     CBG: Recent Labs  Lab 11/12/22 1514 11/12/22 1935 11/12/22 2328 11/13/22 0336 11/13/22 0723  GLUCAP 130* 134* 130* 136* 101*    Review of Systems:   Patient is encephalopathic; therefore, history has been obtained from chart review.    Past Medical History:  She,  has a past medical history of Cataract, Diabetes mellitus without complication (HCC), Fibromyalgia, History of atrial fibrillation without current medication, Hyperlipidemia, Hypertension, and Macular degeneration.   Surgical History:   Past Surgical History:  Procedure Laterality Date   CERVICAL FUSION     ECTOPIC PREGNANCY SURGERY     LUMBAR FUSION     PARTIAL HYSTERECTOMY     RENAL ANGIOGRAPHY N/A 10/03/2021   Procedure: RENAL ANGIOGRAPHY;  Surgeon: Yates Decamp, MD;  Location: [facility] INVASIVE CV LAB;  Service: Cardiovascular;  Laterality: N/A;     Social History:   reports that she has been smoking cigarettes. She has a 17.50 pack-year smoking history. She has never used smokeless tobacco. She reports that she does not currently use alcohol. She reports that she does not  currently use drugs after having used the following drugs: Heroin, Cocaine, and Marijuana.   Family History:  Her family history includes Atrial fibrillation in her mother; Diabetes in her brother and brother; Hyperlipidemia in her brother, brother, and mother; Hypertension in her brother, brother, and mother; Leukemia in her father.   Allergies Allergies  Allergen Reactions   Lisinopril Hives   Metformin And Related Hives    Virl Diamond, MD [place] [facility] Pager: See Loretha Stapler

## 2022-11-13 NOTE — Progress Notes (Signed)
Pharmacy Electrolyte Replacement  Recent Labs:  Recent Labs    11/12/22 0127 11/12/22 1211 11/13/22 0808  K 5.1   < > 3.9  MG 1.4*  --  1.3*  PHOS 4.2  --   --   CREATININE 3.50*   < > 1.64*   < > = values in this interval not displayed.    Low Critical Values (K </= 2.5, Phos </= 1, Mg </= 1) Present: None  MD Contacted: Dr. Wynona Neat made aware as renal function improving (UOP great, SCr down to 1.64)  Plan: Magnesium 6 gm (given as 4 gm followed by 2 gm). Recheck at 2000.   Link Snuffer, PharmD, BCPS, BCCCP Clinical Pharmacist Please refer to [facility] for [facility] Pharm[facility] numbers 11/13/2022, 9:00 AM

## 2022-11-13 NOTE — Progress Notes (Signed)
New Admission Note:   Arrival Method: Wheelchair Mental Orientation: Alert and oriented x4 Telemetry: Box9 Assessment: Completed Skin: intact IV:LUA Pain: 7/10 Tubes: Safety Measures: Safety Fall Prevention Plan has been given, discussed and signed Admission: Completed [facility] Orientation: Patient has been orientated to the room, unit and staff.  Family: Granddaughter bedside  Orders have been reviewed and implemented. Will continue to monitor the patient. Call light has been placed within reach and bed alarm has been activated.   Stacie Glaze LPN [facility] Renal Phone: [phone]

## 2022-11-13 NOTE — Progress Notes (Addendum)
eLink Physician-Brief Progress Note Patient Name: Brittany Carroll DOB: 12-29-1955 MRN: [id number]   Date of Service  11/13/2022  HPI/Events of Note  67 year old female with a history of atrial fibrillation that initially presented with acute encephalopathy in the setting of dehydration and sepsis and acute kidney injury.  Around 12:15 AM, she started developing SVT similar to an episode at 3 PM yesterday.  Echocardiogram reviewed-patient appears to be hyperdynamic with some diastolic dysfunction.  Telemetry reviewed and consistent with supraventricular tachycardia.  On examination, the patient is alert, oriented, and asymptomatic otherwise.  Blood pressure within normal limits.  The patient has been ambulating without evidence of orthostasis.  eICU Interventions  Push metoprolol 5 mg x 1 IV; takes metoprolol succinate daily.  May need to resume this in the morning.  Additional 500 cc LR bolus now.  The patient has had a inaccurate I's and O's-has been using the bathroom intermittently.   0100-minimal response to fluids ordered metoprolol, 50 mg metoprolol tartrate once  Intervention Category Intermediate Interventions: Arrhythmia - evaluation and management  Rogene Meth 11/13/2022, 12:40 AM

## 2022-11-13 NOTE — TOC CM/SW Note (Addendum)
Transition of Care [facility]) - Inpatient Brief Assessment   Patient Details  Name: Shivon Sulkowski MRN: [id number] Date of Birth: Sep 30, 1955  Transition of Care [facility]) CM/SW Contact:    Harriet Masson, RN Phone Number: 11/13/2022, 11:24 AM   Clinical Narrative:  Patient admitted for Hypovolemic shock. Patient currently on room air and has transfer orders to Medical surgical unit.  Patient lives alone and uses [facility] for PCP needs. [address] picks her up for apts.  DME walker, cane Daugther drives and can transport home.  TOC following.    Transition of Care Asessment: Insurance and Status: Insurance coverage has been reviewed Patient has primary care physician: Yes Home environment has been reviewed: safe to discharge home when medically stable Prior level of function:: fine at home Prior/Current Home Services: No current home services Social Determinants of Health Reivew: SDOH reviewed no interventions necessary Readmission risk has been reviewed: Yes Transition of care needs: no transition of care needs at this time

## 2022-11-14 DIAGNOSIS — A419 Sepsis, unspecified organism: Secondary | ICD-10-CM

## 2022-11-14 LAB — CULTURE, BLOOD (ROUTINE X 2): Culture: NO GROWTH

## 2022-11-14 LAB — GLUCOSE, CAPILLARY
Glucose-Capillary: 111 mg/dL — ABNORMAL HIGH (ref 70–99)
Glucose-Capillary: 116 mg/dL — ABNORMAL HIGH (ref 70–99)
Glucose-Capillary: 134 mg/dL — ABNORMAL HIGH (ref 70–99)

## 2022-11-14 MED ORDER — CEPHALEXIN 500 MG PO CAPS: 500.0000 mg | ORAL_CAPSULE | Freq: Four times a day (QID) | ORAL | 0 refills | Status: AC

## 2022-11-14 NOTE — Care Management Important Message (Signed)
Important Message  Patient Details  Name: Brittany Carroll MRN: [id number] Date of Birth: November 21, 1955   Medicare Important Message Given:  Yes Patient left prior to IM delivery will mail a copy to the patient home address.     Brittany Carroll 11/14/2022, 3:33 PM

## 2022-11-14 NOTE — Discharge Summary (Signed)
Physician Discharge Summary  Patient ID: Brittany Carroll MRN: [id number] DOB/AGE: [birth year] 67 y.o.  Admit date: 11/11/2022 Discharge date: 11/14/2022  Problem List Principal Problem:   Septic shock [facility]) Active Problems:   Encephalopathy acute   AKI (acute kidney injury) (HCC)  HPI: Patient is a 67 year old female with pertinent PMH chronic back pain (on Suboxone per family), depression, DMT2, HTN, HLD, hypothyroidism presents to [facility] ED on 6/16 with AMS.   Per family patient has been more altered the past couple days.  Patient has not been eating or drinking over the past few weeks.  On 6/16 patient with lethargy and came to [facility] ED for further eval.  [name] family states that she gets altered like this when she is really dehydrated and improved with IV fluids.  Family states she has not been having any fever or complaining of any abdominal pain, respiratory symptoms.  Also family has not seen patient take any over-the-counter or illegal drugs.   Upon arrival to [facility] ED on 6/16, patient hypotensive 66/43 and temp 99.9 F.  WBC 8.7.  CBG 103.  Patient lethargic but arousable.  Patient having low respiratory rate initially was given small bolus of Narcan with some improvement in RR.  Patient given IV fluids and cultures obtained.  LA and UA pending.  Started on Rocephin.  CXR showing interstitial edema; mild CHF.  Despite IV fluids patient remained hypotensive and started on levo.  PCCM consulted for ICU admission.   Pertinent ED labs: NA 128, K5.3, CO2 16, BUN 68, creatinine 4.31 (baseline around 1.4), Hgb 7.6, TSH and ammonia WNL, VBG 7.26, 39, 53, 17. Hospital Course:  Past Medical History:  Diagnosis Date   Cataract      OU   Diabetes mellitus without complication (HCC)     Fibromyalgia     History of atrial fibrillation without current medication      2008 in [place] had cardioversion   Hyperlipidemia     Hypertension     Macular degeneration    Significant Hospital  Events: Including procedures, antibiotic start and stop dates in addition to other pertinent events   6/16 patient admitted with AMS and hypotension; started on levo; PCCM consulted for admission 6/17- off levophed   BP (!) 190/86 (BP Location: Right Arm)   Pulse 92   Temp 97.9 F (36.6 C) (Oral)   Resp 18   Ht 5\' 7"  (1.702 m)   Wt 97.3 kg   SpO2 98%   BMI 33.60 kg/m  General: Obese female awake alert follows commands HEENT: MM pink/moist JVD or lymphadenopathy is appreciated Neuro: Grossly intact without focal defect CV: Sounds are distant PULM: Diminished in the bases   GI: soft, bsx4 active  GU: Voids  Extremities: warm/dry, 1+ edema  Skin: no rashes or lesions  Assessment & Plan:    Hypovolemic shock Does not appear to have source of significant infection although does have right dorsum of the foot swelling, discomfort, does not appear warmer than the other foot Cultures negative -Vancomycin discontinued -Will discontinue Zosyn -Keflex for 7 days   Encephalopathy -Multifactorial CT head negative Drug screen negative This has improved   Electrolyte derangement including hypomagnesemia -Being repleted   Acute kidney injury with improvement Acute kidney injury on chronic kidney disease stage IIIb Lab Results  Component Value Date   CREATININE 1.64 (H) 11/13/2022   CREATININE 2.87 (H) 11/12/2022   CREATININE 3.50 (H) 11/12/2022     Type 2 diabetes CBG (last 3)  Recent  Labs    11/13/22 2321 11/14/22 0433 11/14/22 0715  GLUCAP 134* 111* 116*    -Continue SSI   Hypertension Hyperlipidemia -Resume home medications    History of atrial fibrillation -Metoprolol was on hold -Will reinitiate metoprolol   History of fibromyalgia, depression -Reinitiate home Lyrica   Chronic pain -Start sublingual Suboxone   History of hypothyroidism -On methimazole   History of tobacco abuse -Holding Wellbutrin   Emphysema on CT chest -Was not on any  inhalers -No PFT on record   Clonidine patch inpatient, patch removed -Prescribed by pain doctor for headaches -Only started using this this month -This may have been contributing to the hypotension as patient has a prescription for oral clonidine as well    Labs at discharge Lab Results  Component Value Date   CREATININE 1.64 (H) 11/13/2022   BUN 36 (H) 11/13/2022   NA 135 11/13/2022   K 3.9 11/13/2022   CL 103 11/13/2022   CO2 24 11/13/2022   Lab Results  Component Value Date   WBC 5.4 11/13/2022   HGB 7.0 (L) 11/13/2022   HCT 22.1 (L) 11/13/2022   MCV 73.9 (L) 11/13/2022   PLT 234 11/13/2022   Lab Results  Component Value Date   ALT 22 11/11/2022   AST 29 11/11/2022   ALKPHOS 80 11/11/2022   BILITOT 0.7 11/11/2022   Lab Results  Component Value Date   INR 1.2 11/11/2022   INR 1.1 09/13/2022   INR 1.2 01/15/2022    Current radiology studies ECHOCARDIOGRAM COMPLETE  Result Date: 11/12/2022    ECHOCARDIOGRAM REPORT   Patient Name:   Brittany Carroll Date of Exam: 11/12/2022 Medical Rec #:  [id number]     Height:       67.0 in Accession #:    [id number]    Weight:       205.9 lb Date of Birth:  1955/10/08     BSA:          2.047 m Patient Age:    67 years      BP:           118/56 mmHg Patient Gender: F             HR:           81 bpm. Exam Location:  Inpatient Procedure: 2D Echo, Cardiac Doppler and Color Doppler Indications:    SHOCK R57.9  History:        Patient has prior history of Echocardiogram examinations, most                 recent 03/23/2020. Risk Factors:Diabetes, Hypertension and                 Current Smoker.  Sonographer:    Dondra Prader RVT RCS Referring Phys: [id number] Beulah Gandy PAYNE IMPRESSIONS  1. Left ventricular ejection fraction, by estimation, is 70 to 75%. The left ventricle has hyperdynamic function. The left ventricle has no regional wall motion abnormalities. There is mild concentric left ventricular hypertrophy. Left ventricular diastolic parameters are  consistent with Grade I diastolic dysfunction (impaired relaxation).  2. Right ventricular systolic function is normal. The right ventricular size is normal.  3. Left atrial size was mildly dilated.  4. The mitral valve is normal in structure. Trivial mitral valve regurgitation. No evidence of mitral stenosis.  5. The aortic valve is tricuspid. There is mild calcification of the aortic valve. Aortic valve regurgitation is not visualized. Aortic valve sclerosis/calcification is present,  without any evidence of aortic stenosis.  6. The inferior vena cava is normal in size with greater than 50% respiratory variability, suggesting right atrial pressure of 3 mmHg. FINDINGS  Left Ventricle: Left ventricular ejection fraction, by estimation, is 70 to 75%. The left ventricle has hyperdynamic function. The left ventricle has no regional wall motion abnormalities. The left ventricular internal cavity size was normal in size. There is mild concentric left ventricular hypertrophy. Left ventricular diastolic parameters are consistent with Grade I diastolic dysfunction (impaired relaxation). Right Ventricle: The right ventricular size is normal. No increase in right ventricular wall thickness. Right ventricular systolic function is normal. Left Atrium: Left atrial size was mildly dilated. Right Atrium: Right atrial size was normal in size. Pericardium: There is no evidence of pericardial effusion. Mitral Valve: The mitral valve is normal in structure. Trivial mitral valve regurgitation. No evidence of mitral valve stenosis. Tricuspid Valve: The tricuspid valve is normal in structure. Tricuspid valve regurgitation is trivial. No evidence of tricuspid stenosis. Aortic Valve: The aortic valve is tricuspid. There is mild calcification of the aortic valve. Aortic valve regurgitation is not visualized. Aortic valve sclerosis/calcification is present, without any evidence of aortic stenosis. Aortic valve mean gradient measures 10.5  mmHg. Aortic valve peak gradient measures 19.4 mmHg. Aortic valve area, by VTI measures 1.48 cm. Pulmonic Valve: The pulmonic valve was normal in structure. Pulmonic valve regurgitation is not visualized. No evidence of pulmonic stenosis. Aorta: The aortic root is normal in size and structure. Venous: The inferior vena cava is normal in size with greater than 50% respiratory variability, suggesting right atrial pressure of 3 mmHg. IAS/Shunts: No atrial level shunt detected by color flow Doppler.  LEFT VENTRICLE PLAX 2D LVIDd:         4.60 cm   Diastology LVIDs:         2.40 cm   LV e' medial:    7.29 cm/s LV PW:         1.30 cm   LV E/e' medial:  12.0 LV IVS:        1.00 cm   LV e' lateral:   10.40 cm/s LVOT diam:     1.50 cm   LV E/e' lateral: 8.4 LV SV:         61 LV SV Index:   30 LVOT Area:     1.77 cm  RIGHT VENTRICLE          IVC RV Basal diam:  3.40 cm  IVC diam: 1.60 cm RV Mid diam:    2.50 cm LEFT ATRIUM             Index        RIGHT ATRIUM           Index LA diam:        3.90 cm 1.90 cm/m   RA Area:     16.80 cm LA Vol (A2C):   74.2 ml 36.24 ml/m  RA Volume:   47.20 ml  23.05 ml/m LA Vol (A4C):   64.1 ml 31.31 ml/m LA Biplane Vol: 75.2 ml 36.73 ml/m  AORTIC VALVE                     PULMONIC VALVE AV Area (Vmax):    1.44 cm      PV Vmax:       1.12 m/s AV Area (Vmean):   1.44 cm      PV Peak grad:  5.0 mmHg AV Area (  VTI):     1.48 cm AV Vmax:           220.00 cm/s AV Vmean:          147.500 cm/s AV VTI:            0.411 m AV Peak Grad:      19.4 mmHg AV Mean Grad:      10.5 mmHg LVOT Vmax:         179.00 cm/s LVOT Vmean:        120.000 cm/s LVOT VTI:          0.343 m LVOT/AV VTI ratio: 0.84  AORTA Ao Root diam: 2.90 cm Ao Asc diam:  3.30 cm MITRAL VALVE MV Area (PHT): 2.70 cm     SHUNTS MV Decel Time: 281 msec     Systemic VTI:  0.34 m MV E velocity: 87.60 cm/s   Systemic Diam: 1.50 cm MV A velocity: 112.00 cm/s MV E/A ratio:  0.78 Arvilla Meres MD Electronically signed by Arvilla Meres  MD Signature Date/Time: 11/12/2022/3:43:21 PM    Final     Disposition:   Discharge to home  Allergies as of 11/14/2022       Reactions   Lisinopril Hives   Metformin And Related Hives        Medication List     STOP taking these medications    buPROPion 150 MG 24 hr tablet Commonly known as: WELLBUTRIN XL       TAKE these medications    amitriptyline 75 MG tablet Commonly known as: ELAVIL Take 75 mg by mouth at bedtime.   aspirin 81 MG chewable tablet Commonly known as: Aspirin Childrens Chew 1 tablet (81 mg total) by mouth daily.   Buprenorphine HCl-Naloxone HCl 8-2 MG Film Place 1 Film under the tongue See admin instructions. Dissolve 1 film under the tongue one to two times a day   cephALEXin 500 MG capsule Commonly known as: KEFLEX Take 1 capsule (500 mg total) by mouth every 6 (six) hours for 7 days.   cloNIDine 0.2 MG tablet Commonly known as: Catapres Take 1 tablet (0.2 mg total) by mouth 2 (two) times daily.   esomeprazole 40 MG capsule Commonly known as: NEXIUM Take 40 mg by mouth daily as needed (acid reflux).   HumaLOG KwikPen 200 UNIT/ML KwikPen Generic drug: insulin lispro Inject 5 Units into the skin 3 (three) times daily after meals.   hydrALAZINE 50 MG tablet Commonly known as: APRESOLINE Take 1 tablet (50 mg total) by mouth 3 (three) times daily.   Jentadueto 2.09-998 MG Tabs Generic drug: linaGLIPtin-metFORMIN HCl Take 1 tablet by mouth 2 (two) times daily.   Lantus SoloStar 100 UNIT/ML Solostar Pen Generic drug: insulin glargine Inject 12 Units into the skin at bedtime.   losartan 100 MG tablet Commonly known as: COZAAR Take 1 tablet (100 mg total) by mouth daily.   methimazole 10 MG tablet Commonly known as: TAPAZOLE Take 10 mg by mouth daily before breakfast. What changed: Another medication with the same name was removed. Continue taking this medication, and follow the directions you see here.   methocarbamol 500 MG  tablet Commonly known as: ROBAXIN Take 500 mg by mouth in the morning and at bedtime.   metoprolol succinate 50 MG 24 hr tablet Commonly known as: TOPROL-XL Take 1 tablet (50 mg total) by mouth every evening. Take with or immediately following a meal.   naloxone 4 MG/0.1ML Liqd nasal spray kit Commonly known as:  NARCAN Place 1 spray into the nose See admin instructions. ADMINISTER IN CASE OF OPIOID EMERGENCY PLACE ONE SPRAY IN NOSTRIL. REPEAT ONE SPRAY IN OTHER NOSTRIL AFTER 3 MINUTES IF NO OR MINIMAL RESPONSE. CALL 911.   Oxycodone HCl 20 MG Tabs Take 20 mg by mouth 4 (four) times daily as needed (FOR PAIN).   pregabalin 150 MG capsule Commonly known as: LYRICA Take 150 mg by mouth 3 (three) times daily.   rosuvastatin 10 MG tablet Commonly known as: CRESTOR Take 10 mg by mouth at bedtime.          Discharged Condition: fair  Time spent on discharge 40 minutes.  Vital signs at Discharge. Temp:  [97.5 F (36.4 C)-98.4 F (36.9 C)] 97.9 F (36.6 C) (06/19 0900) Pulse Rate:  [73-92] 92 (06/19 0900) Resp:  [12-19] 18 (06/19 0431) BP: (139-197)/(67-96) 190/86 (06/19 0900) SpO2:  [93 %-99 %] 98 % (06/19 0900) Weight:  [97.3 kg] 97.3 kg (06/18 1501) Office follow up Special Information or instructions. She is follow-up with Dr. Sherryll Burger in [facility] her primary care physician Signed: Brett Canales Orene Abbasi ACNP Acute Care Nurse Practitioner [facility] Pulmonary/Critical Care Please consult Amion 11/14/2022, 11:16 AM

## 2022-11-14 NOTE — Progress Notes (Signed)
DISCHARGE NOTE HOME Belva Loeber to be discharged Home per MD order. Discussed prescriptions and follow up appointments with the patient. Prescriptions given to patient; medication list explained in detail. Patient verbalized understanding.  Skin clean, dry and intact without evidence of skin break down, no evidence of skin tears noted. IV catheter discontinued intact. Site without signs and symptoms of complications. Dressing and pressure applied. Pt denies pain at the site currently. No complaints noted.  Patient free of lines, drains, and wounds.   An After Visit Summary (AVS) was printed and given to the patient. Patient escorted via wheelchair, and discharged home via private auto.  Margarita Grizzle, RN

## 2022-11-14 NOTE — TOC Transition Note (Signed)
Transition of Care [facility]) - CM/SW Discharge Note   Patient Details  Name: Brittany Carroll MRN: [id number] Date of Birth: [birth year]  [facility] of Care [facility]) CM/SW Contact:  Tom-Johnson, Hershal Coria, RN Phone Number: 11/14/2022, 11:27 AM   Clinical Narrative:     Patient is scheduled for discharge today.  Readmission Risk Assessment done. Outpatient f/u, hospital f/u and discharge instructions on AVS. No TOC needs or recommendations noted. Daughter, Brittany Carroll to transport at discharge.  No further TOC needs noted.       Final next level of care: Home/Self Care Barriers to Discharge: Barriers Resolved   Patient Goals and CMS Choice CMS Medicare.gov Compare Post Acute Care list provided to:: Patient Choice offered to / list presented to : NA  Discharge Placement                  Patient to be transferred to facility by: Daughter Name of family member notified: Brittany Carroll    Discharge Plan and Services Additional resources added to the After Visit Summary for                  DME Arranged: N/A DME Agency: NA       HH Arranged: NA HH Agency: NA        Social Determinants of Health (SDOH) Interventions SDOH Screenings   Tobacco Use: High Risk (09/13/2022)     Readmission Risk Interventions    11/14/2022   11:27 AM  Readmission Risk Prevention Plan  Transportation Screening Complete  PCP or Specialist Appt within 3-5 Days Complete  HRI or Home Care Consult Complete  Social Work Consult for Recovery Care Planning/Counseling Complete  Palliative Care Screening Not Applicable  Medication Review Oceanographer) Referral to Pharmacy

## 2022-11-15 LAB — CULTURE, BLOOD (ROUTINE X 2)

## 2022-11-16 LAB — CULTURE, BLOOD (ROUTINE X 2): Special Requests: ADEQUATE

## 2022-11-19 ENCOUNTER — Other Ambulatory Visit: Payer: Self-pay | Admitting: Cardiology

## 2022-11-19 DIAGNOSIS — I10 Essential (primary) hypertension: Secondary | ICD-10-CM

## 2023-01-14 ENCOUNTER — Encounter (HOSPITAL_COMMUNITY): Payer: Self-pay

## 2023-01-14 ENCOUNTER — Emergency Department (HOSPITAL_BASED_OUTPATIENT_CLINIC_OR_DEPARTMENT_OTHER): Payer: 59

## 2023-01-14 ENCOUNTER — Emergency Department (HOSPITAL_COMMUNITY)
Admission: EM | Admit: 2023-01-14 | Discharge: 2023-01-14 | Disposition: A | Discharge status: Home / Self Care | Payer: 59 | Attending: Emergency Medicine | Admitting: Emergency Medicine

## 2023-01-14 ENCOUNTER — Other Ambulatory Visit: Payer: Self-pay

## 2023-01-14 DIAGNOSIS — Z79899 Other long term (current) drug therapy: Secondary | ICD-10-CM | POA: Diagnosis not present

## 2023-01-14 DIAGNOSIS — M7989 Other specified soft tissue disorders: Secondary | ICD-10-CM | POA: Diagnosis not present

## 2023-01-14 DIAGNOSIS — I10 Essential (primary) hypertension: Secondary | ICD-10-CM | POA: Insufficient documentation

## 2023-01-14 DIAGNOSIS — Z794 Long term (current) use of insulin: Secondary | ICD-10-CM | POA: Insufficient documentation

## 2023-01-14 DIAGNOSIS — E039 Hypothyroidism, unspecified: Secondary | ICD-10-CM | POA: Diagnosis not present

## 2023-01-14 DIAGNOSIS — Z7982 Long term (current) use of aspirin: Secondary | ICD-10-CM | POA: Diagnosis not present

## 2023-01-14 DIAGNOSIS — E119 Type 2 diabetes mellitus without complications: Secondary | ICD-10-CM | POA: Diagnosis not present

## 2023-01-14 DIAGNOSIS — R600 Localized edema: Secondary | ICD-10-CM | POA: Insufficient documentation

## 2023-01-14 LAB — CBC WITH DIFFERENTIAL/PLATELET
Abs Immature Granulocytes: 0.05 10*3/uL (ref 0.00–0.07)
Basophils Absolute: 0 10*3/uL (ref 0.0–0.1)
Basophils Relative: 0 %
Eosinophils Absolute: 0.1 10*3/uL (ref 0.0–0.5)
Eosinophils Relative: 2 %
HCT: 29.6 % — ABNORMAL LOW (ref 36.0–46.0)
Hemoglobin: 8.9 g/dL — ABNORMAL LOW (ref 12.0–15.0)
Immature Granulocytes: 1 %
Lymphocytes Relative: 36 %
Lymphs Abs: 2.6 10*3/uL (ref 0.7–4.0)
MCH: 24.5 pg — ABNORMAL LOW (ref 26.0–34.0)
MCHC: 30.1 g/dL (ref 30.0–36.0)
MCV: 81.3 fL (ref 80.0–100.0)
Monocytes Absolute: 0.5 10*3/uL (ref 0.1–1.0)
Monocytes Relative: 7 %
Neutro Abs: 3.8 10*3/uL (ref 1.7–7.7)
Neutrophils Relative %: 54 %
Platelets: 253 10*3/uL (ref 150–400)
RBC: 3.64 MIL/uL — ABNORMAL LOW (ref 3.87–5.11)
RDW: 17.6 % — ABNORMAL HIGH (ref 11.5–15.5)
WBC: 7.1 10*3/uL (ref 4.0–10.5)
nRBC: 0 % (ref 0.0–0.2)

## 2023-01-14 LAB — COMPREHENSIVE METABOLIC PANEL
ALT: 14 U/L (ref 0–44)
AST: 21 U/L (ref 15–41)
Albumin: 3.4 g/dL — ABNORMAL LOW (ref 3.5–5.0)
Alkaline Phosphatase: 84 U/L (ref 38–126)
Anion gap: 13 (ref 5–15)
BUN: 14 mg/dL (ref 8–23)
CO2: 23 mmol/L (ref 22–32)
Calcium: 8.7 mg/dL — ABNORMAL LOW (ref 8.9–10.3)
Chloride: 99 mmol/L (ref 98–111)
Creatinine, Ser: 0.94 mg/dL (ref 0.44–1.00)
GFR, Estimated: >60 mL/min (ref >60)
Glucose, Bld: 95 mg/dL (ref 70–99)
Potassium: 3.9 mmol/L (ref 3.5–5.1)
Sodium: 135 mmol/L (ref 135–145)
Total Bilirubin: 0.4 mg/dL (ref 0.3–1.2)
Total Protein: 6.8 g/dL (ref 6.5–8.1)

## 2023-01-14 NOTE — ED Provider Notes (Signed)
I was notified by Christene Lye, RT that patient is negative for DVT in bilateral lower extremities   Brittany Merles, PA-C 01/14/23 1534    Bethann Berkshire, MD 01/16/23 1106

## 2023-01-14 NOTE — ED Triage Notes (Signed)
Pt came in via POV d/t her blood work done at her PCP office at [facility] showing she had blood clots (per pt). A/Ox4, denies pain or SOB, not on thinners.

## 2023-01-14 NOTE — ED Provider Triage Note (Signed)
Emergency Medicine Provider Triage Evaluation Note  Brittany Carroll , a 67 y.o. female  was evaluated in triage.  Pt complains of being sent over by her primary care provider for "clot".  She says she had blood work done in her primary's concern for this so sent her over to the ER.  She does not know what this lab was.  She states she has had bilateral lower extremity swelling in her calf for the past 4 days.  Denies any pain in her calf.  Denies any shortness of breath or chest pain.  Denies any recent hospitalizations, surgeries, hormonal therapies, long plane or car rides.  Review of Systems  Positive: Above Negative: As above  Physical Exam  BP 130/66   Pulse 77   Temp 98 F (36.7 C)   Resp 16   Ht 5\' 7"  (1.702 m)   Wt 95.7 kg   SpO2 97%   BMI 33.05 kg/m  Gen:   Awake, no distress   Resp:  Normal effort  MSK:   Moves extremities without difficulty  Other:  Bilateral lower extremity edema, calves nontender to palpation Cardiac auscultation with regular rate and rhythm  Medical Decision Making  Medically screening exam initiated at 1:18 PM.  Appropriate orders placed.  Earla Lafont was informed that the remainder of the evaluation will be completed by another provider, this initial triage assessment does not replace that evaluation, and the importance of remaining in the ED until their evaluation is complete.     Arabella Merles, PA-C 01/14/23 1326

## 2023-01-14 NOTE — Progress Notes (Signed)
Bilateral lower extremity venous study completed.   Preliminary results relayed to PA in ER.  Please see CV Procedures for preliminary results.  Greg Eckrich, RVT  3:00 PM 01/14/23

## 2023-01-14 NOTE — ED Provider Notes (Signed)
[place] [facility]MENT AT [facility] Provider Note   CSN: [id number] Arrival date & time: 01/14/23  1109     History  Chief Complaint  Patient presents with   Abnormal Labs from PCP    Brittany Carroll is a 67 y.o. female.  HPI 67 year old female history of chronic back pain, diabetes, hypertension, hyperlipidemia, hypothyroidism presenting for abnormal lab.  Patient states over the last couple weeks she has had symmetric lower extremity edema.  She saw her PCP who did lab work which per patient was notable for elevated D-dimer and concern for blood clot.  They sent her here for evaluation.  She does not really have any pain in her leg other than her right hip, no falls or trauma or skin changes.  No numbness or weakness.  She is not had any chest pain or shortness of breath.  No history of DVT or PE or heart failure.  She has no orthopnea or dyspnea on exertion.  No abdominal pain or distention.  She is otherwise been at her baseline health.  No fevers.     Home Medications Prior to Admission medications   Medication Sig Start Date End Date Taking? Authorizing Provider  amitriptyline (ELAVIL) 75 MG tablet Take 75 mg by mouth at bedtime. 11/15/20   [provider]  aspirin (ASPIRIN CHILDRENS) 81 MG chewable tablet Chew 1 tablet (81 mg total) by mouth daily. 06/26/22   Yates Decamp, MD  Buprenorphine HCl-Naloxone HCl 8-2 MG FILM Place 1 Film under the tongue See admin instructions. Dissolve 1 film under the tongue one to two times a day    Ellwood Sayers, MD  cloNIDine (CATAPRES) 0.2 MG tablet take 1 Tablet by mouth twice daily (morning, bedtime) 11/19/22   Yates Decamp, MD  esomeprazole (NEXIUM) 40 MG capsule Take 40 mg by mouth daily as needed (acid reflux).    [provider]  HUMALOG KWIKPEN 200 UNIT/ML KwikPen Inject 5 Units into the skin 3 (three) times daily after meals. 06/29/21   [provider]  hydrALAZINE (APRESOLINE) 50 MG tablet Take 1  tablet (50 mg total) by mouth 3 (three) times daily. 05/23/22 05/18/23  Yates Decamp, MD  insulin glargine (LANTUS SOLOSTAR) 100 UNIT/ML Solostar Pen Inject 12 Units into the skin at bedtime.    [provider]  JENTADUETO 2.09-998 MG TABS Take 1 tablet by mouth 2 (two) times daily. 11/09/20   [provider]  losartan (COZAAR) 100 MG tablet Take 1 tablet (100 mg total) by mouth daily. 01/20/22   Rai, Delene Ruffini, MD  methimazole (TAPAZOLE) 10 MG tablet Take 10 mg by mouth daily before breakfast.    [provider]  methocarbamol (ROBAXIN) 500 MG tablet Take 500 mg by mouth in the morning and at bedtime.    [provider]  metoprolol succinate (TOPROL-XL) 50 MG 24 hr tablet Take 1 tablet (50 mg total) by mouth every evening. Take with or immediately following a meal. 01/26/22 01/21/23  Yates Decamp, MD  naloxone [facility]) nasal spray 4 mg/0.1 mL Place 1 spray into the nose See admin instructions. ADMINISTER IN CASE OF OPIOID EMERGENCY PLACE ONE SPRAY IN NOSTRIL. REPEAT ONE SPRAY IN OTHER NOSTRIL AFTER 3 MINUTES IF NO OR MINIMAL RESPONSE. CALL 911.    [provider]  Oxycodone HCl 20 MG TABS Take 20 mg by mouth 4 (four) times daily as needed (FOR PAIN). 05/30/19   [provider]  pregabalin (LYRICA) 150 MG capsule Take 150 mg  by mouth 3 (three) times daily.    [provider]  rosuvastatin (CRESTOR) 10 MG tablet Take 10 mg by mouth at bedtime.    [provider]      Allergies    Lisinopril and Metformin and related    Review of Systems   Review of Systems Review of systems completed and notable as per HPI.  ROS otherwise negative.   Physical Exam Updated Vital Signs BP (!) 198/93 (BP Location: Right Arm)   Pulse 79   Temp 97.8 F (36.6 C) (Oral)   Resp 17   Ht 5\' 7"  (1.702 m)   Wt 95.7 kg   SpO2 99%   BMI 33.05 kg/m  Physical Exam Vitals and nursing note reviewed.  Constitutional:      General: She is not in acute  distress.    Appearance: She is well-developed.  HENT:     Head: Normocephalic and atraumatic.  Eyes:     Conjunctiva/sclera: Conjunctivae normal.  Cardiovascular:     Rate and Rhythm: Normal rate and regular rhythm.     Pulses: Normal pulses.     Heart sounds: Normal heart sounds. No murmur heard. Pulmonary:     Effort: Pulmonary effort is normal. No respiratory distress.     Breath sounds: Normal breath sounds. No rales.  Abdominal:     Palpations: Abdomen is soft.     Tenderness: There is no abdominal tenderness.  Musculoskeletal:        General: No swelling.     Cervical back: Neck supple.     Right lower leg: Edema present.     Left lower leg: Edema present.     Comments: 2+ pitting edema to the calves.  Palpable DP and PT pulse bilaterally.  No skin changes.  Skin:    General: Skin is warm and dry.     Capillary Refill: Capillary refill takes less than 2 seconds.  Neurological:     Mental Status: She is alert.  Psychiatric:        Mood and Affect: Mood normal.     ED Results / Procedures / Treatments   Labs (all labs ordered are listed, but only abnormal results are displayed) Labs Reviewed  CBC WITH DIFFERENTIAL/PLATELET - Abnormal; Notable for the following components:      Result Value   RBC 3.64 (*)    Hemoglobin 8.9 (*)    HCT 29.6 (*)    MCH 24.5 (*)    RDW 17.6 (*)    All other components within normal limits  COMPREHENSIVE METABOLIC PANEL - Abnormal; Notable for the following components:   Calcium 8.7 (*)    Albumin 3.4 (*)    All other components within normal limits    EKG None  Radiology VAS [place] LOWER EXTREMITY VENOUS (DVT) (ONLY [facility] & [facility])  Result Date: 01/14/2023  Lower Venous DVT Study Patient Name:  Brittany Carroll  Date of Exam:   01/14/2023 Medical Rec #: [id number]      Accession #:    [id number] Date of Birth: July 30, 1955      Patient Gender: F Patient Age:   35 years Exam Location:  [facility] Procedure:      VAS [place] LOWER EXTREMITY  VENOUS (DVT) Referring Phys: Arabella Merles --------------------------------------------------------------------------------  Indications: Swelling x4days.  Limitations: Body habitus and poor ultrasound/tissue interface. Comparison Study: Previous study 09/13/22 negative. Performing Technologist: McKayla Maag RVT, [place]  Examination Guidelines: A complete evaluation includes B-mode imaging, spectral Doppler, color Doppler,  and power Doppler as needed of all accessible portions of each vessel. Bilateral testing is considered an integral part of a complete examination. Limited examinations for reoccurring indications may be performed as noted. The reflux portion of the exam is performed with the patient in reverse Trendelenburg.  +---------+---------------+---------+-----------+----------+--------------+ RIGHT    CompressibilityPhasicitySpontaneityPropertiesThrombus Aging +---------+---------------+---------+-----------+----------+--------------+ CFV      Full           Yes      Yes                                 +---------+---------------+---------+-----------+----------+--------------+ SFJ      Full                                                        +---------+---------------+---------+-----------+----------+--------------+ FV Prox  Full                                                        +---------+---------------+---------+-----------+----------+--------------+ FV Mid   Full                                                        +---------+---------------+---------+-----------+----------+--------------+ FV DistalFull                                                        +---------+---------------+---------+-----------+----------+--------------+ PFV      Full                                                        +---------+---------------+---------+-----------+----------+--------------+ POP      Full           Yes      Yes                                  +---------+---------------+---------+-----------+----------+--------------+ PTV      Full                                                        +---------+---------------+---------+-----------+----------+--------------+ PERO     Full                                         limited        +---------+---------------+---------+-----------+----------+--------------+   +---------+---------------+---------+-----------+----------+--------------+ LEFT  CompressibilityPhasicitySpontaneityPropertiesThrombus Aging +---------+---------------+---------+-----------+----------+--------------+ CFV      Full           Yes      Yes                                 +---------+---------------+---------+-----------+----------+--------------+ SFJ      Full                                                        +---------+---------------+---------+-----------+----------+--------------+ FV Prox  Full                                                        +---------+---------------+---------+-----------+----------+--------------+ FV Mid   Full                                                        +---------+---------------+---------+-----------+----------+--------------+ FV DistalFull                                                        +---------+---------------+---------+-----------+----------+--------------+ PFV      Full                                                        +---------+---------------+---------+-----------+----------+--------------+ POP      Full           Yes      Yes                                 +---------+---------------+---------+-----------+----------+--------------+ PTV      Full                                                        +---------+---------------+---------+-----------+----------+--------------+ PERO     Full                                         limited         +---------+---------------+---------+-----------+----------+--------------+     Summary: RIGHT: - There is no evidence of deep vein thrombosis in the lower extremity. However, portions of this examination were limited- see technologist comments above.  - No cystic structure found in the popliteal fossa. - Ultrasound characteristics of enlarged lymph nodes are noted in the groin.  LEFT: - There is no evidence  of deep vein thrombosis in the lower extremity. However, portions of this examination were limited- see technologist comments above.  - No cystic structure found in the popliteal fossa. - Ultrasound characteristics of enlarged lymph nodes noted in the groin.  *See table(s) above for measurements and observations. Electronically signed by Sherald Hess MD on 01/14/2023 at 6:24:08 PM.    Final     Procedures Procedures    Medications Ordered in ED Medications - No data to display  ED Course/ Medical Decision Making/ A&P                                 Medical Decision Making  Medical Decision Making:   Christi Heelan is a 67 y.o. female who presented to the ED today with abnormal lab value and leg swelling for vital signs reviewed.  On exam she does have bilateral extremity edema which is symmetric.  No pain, neurovascular intact.  No signs of infection.  Reportedly had elevated D-dimer.  Her ultrasound does not show any signs of DVT.  Does note lymphadenopathy in the groin which is nonspecific.  No palpable lymphadenopathy on exam or signs of abscess or other infection.  Abdominal exam is benign, no signs of mass or other more proximal obstruction at this time.  She has no orthopnea, DOE, shortness of breath, chest pain and no history of this low suspicion for acute heart failure.  Given reassuring workup I think she is stable for outpatient management, however I did recommend she call her doctor tomorrow to schedule close follow-up for further workup of her lymphadenopathy and leg swelling.  I  gave her strict return precautions for worsening symptoms or development of symptoms of heart failure, infection, or other new findings.   [name] presentation is most consistent with acute complicated illness / injury requiring diagnostic workup.           Final Clinical Impression(s) / ED Diagnoses Final diagnoses:  Leg swelling    Rx / DC Orders ED Discharge Orders     None         Laurence Spates, MD 01/14/23 [phone]

## 2023-01-14 NOTE — Discharge Instructions (Addendum)
Your ultrasound today did not show any evidence of blood clot but did show some enlarged lymph nodes in your groin.  I recommend you call your doctor tomorrow to schedule close follow-up for further workup.  If you develop chest pain, shortness of breath, worsening swelling or any other new symptoms you should return to the ED.

## 2023-01-22 ENCOUNTER — Ambulatory Visit: Payer: Medicare HMO | Admitting: Physician Assistant

## 2023-02-14 ENCOUNTER — Other Ambulatory Visit: Payer: Self-pay | Admitting: Cardiology

## 2023-02-14 DIAGNOSIS — I10 Essential (primary) hypertension: Secondary | ICD-10-CM

## 2023-02-20 ENCOUNTER — Other Ambulatory Visit: Payer: Self-pay | Admitting: Family Medicine

## 2023-02-20 DIAGNOSIS — Z1239 Encounter for other screening for malignant neoplasm of breast: Secondary | ICD-10-CM

## 2023-02-22 ENCOUNTER — Ambulatory Visit: Payer: Medicare HMO

## 2023-03-13 ENCOUNTER — Other Ambulatory Visit: Payer: Self-pay | Admitting: Family Medicine

## 2023-03-13 ENCOUNTER — Ambulatory Visit
Admission: RE | Admit: 2023-03-13 | Discharge: 2023-03-13 | Disposition: A | Discharge status: Home / Self Care | Payer: Medicare HMO | Source: Ambulatory Visit | Attending: Family Medicine | Admitting: Family Medicine

## 2023-03-13 DIAGNOSIS — M1711 Unilateral primary osteoarthritis, right knee: Secondary | ICD-10-CM

## 2023-03-21 ENCOUNTER — Other Ambulatory Visit: Payer: Medicare HMO

## 2023-04-12 ENCOUNTER — Other Ambulatory Visit: Payer: Self-pay | Admitting: Cardiology

## 2023-04-12 MED ORDER — HYDRALAZINE HCL 50 MG PO TABS: 50.0000 mg | ORAL_TABLET | Freq: Three times a day (TID) | ORAL | 0 refills | Status: DC

## 2023-05-30 ENCOUNTER — Other Ambulatory Visit: Payer: Self-pay | Admitting: Cardiology

## 2023-05-30 DIAGNOSIS — I10 Essential (primary) hypertension: Secondary | ICD-10-CM

## 2023-06-24 ENCOUNTER — Other Ambulatory Visit: Payer: Self-pay | Admitting: Cardiology

## 2023-07-10 ENCOUNTER — Other Ambulatory Visit: Payer: Self-pay | Admitting: Cardiology

## 2023-07-10 DIAGNOSIS — I10 Essential (primary) hypertension: Secondary | ICD-10-CM

## 2023-07-16 ENCOUNTER — Other Ambulatory Visit: Payer: Self-pay | Admitting: Family Medicine

## 2023-07-16 DIAGNOSIS — M1990 Unspecified osteoarthritis, unspecified site: Secondary | ICD-10-CM

## 2023-07-22 ENCOUNTER — Other Ambulatory Visit: Payer: Self-pay | Admitting: Cardiology

## 2023-07-22 DIAGNOSIS — I10 Essential (primary) hypertension: Secondary | ICD-10-CM

## 2023-10-10 ENCOUNTER — Telehealth: Payer: Self-pay | Admitting: Cardiology

## 2023-10-10 DIAGNOSIS — I10 Essential (primary) hypertension: Secondary | ICD-10-CM

## 2023-10-10 MED ORDER — CLONIDINE HCL 0.2 MG PO TABS
0.2000 mg | ORAL_TABLET | Freq: Two times a day (BID) | ORAL | 0 refills | Status: DC
Start: 2023-10-10 — End: 2023-11-19

## 2023-10-10 NOTE — Telephone Encounter (Signed)
 Pt's medication was sent to pt's pharmacy as requested. Confirmation received.

## 2023-10-10 NOTE — Telephone Encounter (Signed)
*  STAT* If patient is at the pharmacy, call can be transferred to refill team.   1. Which medications need to be refilled? (please list name of each medication and dose if known) cloNIDine  (CATAPRES ) 0.2 MG tablet   2. Which pharmacy/location (including street and city if local pharmacy) is medication to be sent to?  [facility] [place], [place] - [address] [facility].    3. Do they need a 30 day or 90 day supply? 90  Patient has appt on 6/17

## 2023-10-31 ENCOUNTER — Other Ambulatory Visit: Payer: Self-pay | Admitting: Cardiology

## 2023-10-31 DIAGNOSIS — I10 Essential (primary) hypertension: Secondary | ICD-10-CM

## 2023-11-12 ENCOUNTER — Ambulatory Visit: Admitting: Cardiology

## 2023-11-15 ENCOUNTER — Ambulatory Visit: Attending: Physician Assistant | Admitting: Physician Assistant

## 2023-11-17 NOTE — Progress Notes (Signed)
 This encounter was created in error - please disregard.

## 2023-11-18 ENCOUNTER — Other Ambulatory Visit: Payer: Self-pay | Admitting: Cardiology

## 2023-11-18 DIAGNOSIS — I10 Essential (primary) hypertension: Secondary | ICD-10-CM

## 2023-11-28 ENCOUNTER — Other Ambulatory Visit: Payer: Self-pay | Admitting: Cardiology

## 2023-11-28 DIAGNOSIS — I10 Essential (primary) hypertension: Secondary | ICD-10-CM

## 2023-12-16 ENCOUNTER — Ambulatory Visit: Payer: Self-pay

## 2023-12-16 NOTE — Telephone Encounter (Signed)
 FYI Only or Action Required?: FYI only for provider.  Called Nurse Triage reporting Hip Pain.  Symptoms began today.  Interventions attempted: Prescription medications: Suboxone .  Symptoms are: gradually worsening.  Triage Disposition: See HCP Within 4 Hours (Or PCP Triage)  Patient/caregiver understands and will follow disposition?: Yes   Patient is not established but has a new patient appointment scheduled. Patient advised to go to the ED for her current symptoms. Patient verbalized Understanding.    Copied from CRM [id number]. Topic: Clinical - Red Word Triage >> Dec 16, 2023  5:27 PM Donee H wrote: Red Word that prompted transfer to Nurse Triage: Patient is experiencing severe pain in hip , leg and knee. She is also stating has a major headache. She has an new pt visit schedule for Sept. 18 with Bruna Creighton. She was calling to get an appointment asap but there are no available appointments. She states in pain and need to see the doctor     Reason for Disposition  [1] SEVERE pain (e.g., excruciating, unable to do any normal activities) AND [2] not improved after 2 hours of pain medicine  Answer Assessment - Initial Assessment Questions 1. LOCATION and RADIATION: Where is the pain located? Does the pain spread (shoot) anywhere else?     Right side  2. QUALITY: What does the pain feel like?  (e.g., sharp, dull, aching, burning)     Sharp and aching  3. SEVERITY: How bad is the pain? What does it keep you from doing?   (Scale 1-10; or mild, moderate, severe)     10/10 4. ONSET: When did the pain start? Does it come and go, or is it there all the time?     4 hours ago  5. WORK OR EXERCISE: Has there been any recent work or exercise that involved this part of the body?      No 6. CAUSE: What do you think is causing the hip pain?      History of similar pain  8. OTHER SYMPTOMS: Do you have any other symptoms? (e.g., back pain, pain shooting down leg,  fever,  rash)     Right knee pain from hip, headache  Protocols used: Hip Pain-A-AH

## 2023-12-23 ENCOUNTER — Ambulatory Visit: Payer: Self-pay

## 2023-12-23 NOTE — Telephone Encounter (Signed)
 FYI Only or Action Required?: FYI only for provider.  Patient has a new patient appointment scheduled for 02/13/2024  Called Nurse Triage reporting Shoulder Pain, Elbow Pain, Hip Pain, and Knee Pain.  Symptoms began several days ago.  Interventions attempted: Rest, hydration, or home remedies.  Symptoms are: unchanged.  Triage Disposition: Go to ED Now (or PCP Triage)-patient recommended to the ED. Patient instructed that she is already on the wait list and will be called if an earlier slot opens.    Patient/caregiver understands and will follow disposition?: Yes  Copied from CRM [id number]. Topic: Clinical - Red Word Triage >> Dec 23, 2023  4:14 PM Fonda T wrote: [facility] that prompted transfer to Nurse Triage: Patient reporting left side shoulder pain, and right sided elbow pain radiating to hip/knee pain as well. She also reports unable to lift left arm. Reason for Disposition  [1] SEVERE pain AND [2] not improved 2 hours after pain medicine  Answer Assessment - Initial Assessment Questions 1. ONSET: When did the pain start?     Started 2 days ago 2. LOCATION: Where is the pain located?     Left shoulder 3. PAIN: How bad is the pain? (Scale 1-10; or mild, moderate, severe)     10 4. WORK OR EXERCISE: Has there been any recent work or exercise that involved this part of the body?     no 5. CAUSE: What do you think is causing the shoulder pain?     unsure 6. OTHER SYMPTOMS: Do you have any other symptoms? (e.g., neck pain, swelling, rash, fever, numbness, weakness)     Swelling  Patient reports she is unable to lift her left arm. Patient endorses pain to right elbow along with hip and knee pain as well.  Protocols used: Shoulder Pain-A-AH

## 2023-12-24 ENCOUNTER — Ambulatory Visit: Payer: Self-pay

## 2023-12-24 NOTE — Telephone Encounter (Signed)
 FYI Only or Action Required?: FYI only for provider.  Called Nurse Triage reporting Shoulder Pain.  Symptoms began several days ago.  Interventions attempted: Rest, hydration, or home remedies.  Symptoms are: gradually worsening.  Triage Disposition: Go to ED Now (or PCP Triage)  Patient/caregiver understands and will follow disposition?: Yes    Copied from CRM #[id number]. Topic: Clinical - Red Word Triage >> Dec 24, 2023  1:08 PM Antwanette L wrote: Red Word that prompted transfer to Nurse Triage: Patient reporting  serve pain in the left side shoulder , and right sided elbow pain radiating to hip/knee, and right leg, and pain as well. She also reports unable to lift left arm.    Reason for Disposition  [1] SEVERE pain AND [2] not improved 2 hours after pain medicine  Answer Assessment - Initial Assessment Questions Patient triaged yesterday for the same and was advised to go to the ED. She states she was unable to but that her daughter is coming to take her today. Patient advised there are no earlier new patient appointments available at this time.    1. ONSET: When did the pain start?     4 days ago 2. LOCATION: Where is the pain located?     Left shoulder  3. PAIN: How bad is the pain? (Scale 1-10; or mild, moderate, severe)     Severe 4. WORK OR EXERCISE: Has there been any recent work or exercise that involved this part of the body?     No  5. CAUSE: What do you think is causing the shoulder pain?     Unsure 6. OTHER SYMPTOMS: Do you have any other symptoms? (e.g., neck pain, swelling, rash, fever, numbness, weakness)     Swelling,  right elbow pain, right hip pain, and right knee pain  Protocols used: Shoulder Pain-A-AH

## 2024-01-07 ENCOUNTER — Other Ambulatory Visit: Payer: Self-pay

## 2024-01-07 ENCOUNTER — Emergency Department (HOSPITAL_COMMUNITY)

## 2024-01-07 ENCOUNTER — Encounter (HOSPITAL_COMMUNITY): Payer: Self-pay | Admitting: Emergency Medicine

## 2024-01-07 ENCOUNTER — Emergency Department (HOSPITAL_COMMUNITY)
Admission: EM | Admit: 2024-01-07 | Discharge: 2024-01-08 | Discharge status: Against Medical Advice | Attending: Emergency Medicine | Admitting: Emergency Medicine

## 2024-01-07 DIAGNOSIS — Z5321 Procedure and treatment not carried out due to patient leaving prior to being seen by health care provider: Secondary | ICD-10-CM | POA: Diagnosis not present

## 2024-01-07 DIAGNOSIS — M25559 Pain in unspecified hip: Secondary | ICD-10-CM | POA: Diagnosis not present

## 2024-01-07 DIAGNOSIS — R519 Headache, unspecified: Secondary | ICD-10-CM | POA: Diagnosis present

## 2024-01-07 LAB — BASIC METABOLIC PANEL WITH GFR
Anion gap: 12 (ref 5–15)
BUN: 19 mg/dL (ref 8–23)
CO2: 20 mmol/L — ABNORMAL LOW (ref 22–32)
Calcium: 9.5 mg/dL (ref 8.9–10.3)
Chloride: 107 mmol/L (ref 98–111)
Creatinine, Ser: 1.3 mg/dL — ABNORMAL HIGH (ref 0.44–1.00)
GFR, Estimated: 45 mL/min — ABNORMAL LOW (ref >60)
Glucose, Bld: 154 mg/dL — ABNORMAL HIGH (ref 70–99)
Potassium: 4.1 mmol/L (ref 3.5–5.1)
Sodium: 139 mmol/L (ref 135–145)

## 2024-01-07 LAB — CBC
HCT: 38.5 % (ref 36.0–46.0)
Hemoglobin: 12.4 g/dL (ref 12.0–15.0)
MCH: 27 pg (ref 26.0–34.0)
MCHC: 32.2 g/dL (ref 30.0–36.0)
MCV: 83.7 fL (ref 80.0–100.0)
Platelets: 289 K/uL (ref 150–400)
RBC: 4.6 MIL/uL (ref 3.87–5.11)
RDW: 14.3 % (ref 11.5–15.5)
WBC: 9.4 K/uL (ref 4.0–10.5)
nRBC: 0 % (ref 0.0–0.2)

## 2024-01-07 NOTE — ED Triage Notes (Signed)
 Patient here with complaints of headache for 2 days. History of cervical fusion. Reports pain meds are not working and does not go back to the doctor until next month.

## 2024-01-07 NOTE — ED Triage Notes (Signed)
 Pt in ambulatory with reported HA that began this week with the rain, wraps from back of head to front. Denies any blurred vision or photophobia. States she has run out of her pain meds prescribed earlier this week.

## 2024-01-07 NOTE — ED Provider Triage Note (Signed)
 Emergency Medicine Provider Triage Evaluation Note  Brittany Carroll , a 68 y.o. female  was evaluated in triage.  Pt complains of headache, hip pain for the last 2 days, worse than normal.  She reports that she has chronic pain that feels similar to this but it is not improving with her home medications.  Reports that she thinks it is worse because of the rain.  Denies any numbness, weakness, facial droop, vision changes  Review of Systems  Positive: Headache, hip pain Negative:   Physical Exam  BP (!) 152/99 (BP Location: Left Arm)   Pulse (!) 102   Temp 98.2 F (36.8 C)   Resp 16   Wt 95.7 kg   SpO2 100%   BMI 33.04 kg/m  Gen:   Awake, no distress   Resp:  Normal effort  MSK:   Moves extremities without difficulty  Other:    Medical Decision Making  Medically screening exam initiated at 8:05 PM.  Appropriate orders placed.  Ariahna Northrop was informed that the remainder of the evaluation will be completed by another provider, this initial triage assessment does not replace that evaluation, and the importance of remaining in the ED until their evaluation is complete.  Workup initiated in triage    Rosan Sherlean VEAR DEVONNA 01/07/24 2005

## 2024-01-08 NOTE — ED Notes (Signed)
 Pt name called for updated vitals, no response

## 2024-01-16 NOTE — Therapy (Signed)
 OUTPATIENT PHYSICAL THERAPY WHEELCHAIR EVALUATION   Patient Name: Brittany Carroll MRN: [id number] DOB:1956/05/08, 68 y.o., female Today's Date: 01/16/2024  END OF SESSION:   Past Medical History:  Diagnosis Date   Cataract    OU   Diabetes mellitus without complication (HCC)    Fibromyalgia    History of atrial fibrillation without current medication    2008 in [place] had cardioversion   Hyperlipidemia    Hypertension    Macular degeneration    Past Surgical History:  Procedure Laterality Date   CERVICAL FUSION     ECTOPIC PREGNANCY SURGERY     LUMBAR FUSION     PARTIAL HYSTERECTOMY     RENAL ANGIOGRAPHY N/A 10/03/2021   Procedure: RENAL ANGIOGRAPHY;  Surgeon: Ladona Heinz, MD;  Location: [facility];  Service: Cardiovascular;  Laterality: N/A;   Patient Active Problem List   Diagnosis Date Noted   Septic shock (HCC) 11/11/2022   Encephalopathy acute 11/11/2022   AKI (acute kidney injury) (HCC) 11/11/2022   Chronic prescription opiate use 01/15/2022   Acute metabolic encephalopathy 01/15/2022   Hypotension 01/15/2022   Hyperthyroidism 01/15/2022   DM2 (diabetes mellitus, type 2) (HCC) 01/15/2022   Abnormal liver CT 01/15/2022   Resistant hypertension    Abnormal renal ultrasound    History of lumbar fusion 02/17/2019   Spinal stenosis of lumbar region 12/25/2018   Spondylolisthesis, grade 1 12/25/2018   Low back pain 07/12/2017    PCP: Dr. Leni Fairly  REFERRING PROVIDER: Fairly Leni Edyth DELENA, MD  THERAPY DIAG:  No diagnosis found.  Rationale for Evaluation and Treatment Rehabilitation  SUBJECTIVE:                                                                                                                                                                                           SUBJECTIVE STATEMENT: Pt presents for wheelchair evaluation. Patient had L2-S1 lumbar fusion in 08/2020 and from T10-pelvis fusion revision in 07/2022. Pt also reports of cervical  fusion but unsure of specific levels.  PRECAUTIONS: Fall  RED FLAGS: None  WEIGHT BEARING RESTRICTIONS No    OCCUPATION: on disability  PLOF:  Needs assistance with ADLs, Needs assistance with homemaking, Needs assistance with gait, Needs assistance with transfers, and Leisure: Pt has caregiver/aide who comes 3x/week for 3-4 hours to help with clean, get dressed sometimes  PATIENT GOALS: obtain power mobility device.         MEDICAL HISTORY:  Primary diagnosis onset: 01/15/2024     Medical Diagnosis with ICD-10 code: M79.7 (ICD-10-CM) - Fibrositis M51.379 (ICD-10-CM) - Degeneration of lumbar or lumbosacral intervertebral disc   []   Progressive disease  Relevant future surgeries:     Height: 5' 6 Weight: 197 lbs Explain recent changes or trends in weight:      History:  Past Medical History:  Diagnosis Date   Cataract    OU   Diabetes mellitus without complication (HCC)    Fibromyalgia    History of atrial fibrillation without current medication    2008 in [place] had cardioversion   Hyperlipidemia    Hypertension    Macular degeneration        Cardio Status:  Functional Limitations:   [] Intact  [x]  Impaired    HTN  Respiratory Status:  Functional Limitations:   [x] Intact  [] Impaired   [] SOB [] COPD [] O2 Dependent ______LPM  [] Ventilator Dependent  Resp equip:                                                     Objective Measure(s):   Orthotics:   [] Amputee:                                                             [] Prosthesis:        HOME ENVIRONMENT:  [] House [] Condo/town home [x] Apartment [] Asst living [] LTCF         [] Own  [x] Rent   [x] Lives alone [] Lives with others -                             Hours without assistance: 24 hours  [x] Home is accessible to patient                                 Storage of wheelchair:  [x] In home   [] Other Comments:        COMMUNITY :  TRANSPORTATION:  [] Car [] Electronics engineer [] Adapted w/c Lift []   Ambulance [] Other:                     [] Sits in wheelchair during transport   Where is w/c stored during transport?  [] [place]  []  [employer]  r   [] Self-Driver       Drive while in  Biomedical scientist [] yes [x] no   Employment and/or school:  Specific requirements pertaining to mobility        Other:  COMMUNICATION:  Verbal Communication  [x] WFL [] receptive [] WFL [] expressive [] Understandable  [] Difficult to understand  [] non-communicative  Primary Language:____English__________ 2nd:_____________  Communication provided by:[x] Patient [] Family [] Caregiver [] Translator   [] Uses an Paramedic device     Manufacturer/Model :                                                                MOBILITY/BALANCE:  Sitting Balance  Standing Balance  Transfers  Ambulation   [x] WFL      [] WFL  [] Independent  []  Independent   [] Uses  UE for balance in sitting Comments:  [x] Uses UE/device for stability Comments:  [x]  Min assist uses AD []  Ambulates independently with       device:_________________      []  Mod assist  [x]  Able to ambulate __115'___ feet        safely/functionally/independently - with rolator  []  Min assist  []  Min assist  []  Max assist  []  Non-functional ambulator         History/High risk of falls   []  Mod assist  []  Mod assist  []  Dependent  []  Unable to ambulate   []  Max  assist  []  Max assist  Transfer method:[] 1 person [] 2 person [] sliding board [] squat pivot [x] stand pivot [] mechanical patient lift  [] other:   []  Unable  []  Unable    Fall History: # of falls in the past 6 months? 1-2 # of "near" falls in the past 6 months? multiple    CURRENT SEATING / MOBILITY:  Current Mobility Device: [] None [x] Cane/Walker [] Manual [] Dependent [] Dependent w/ Tilt rScooter  [] Power (type of control):   Manufacturer:  Model:  Serial #:   Size:  Color:  Age:   Purchased by whom:   Current condition of mobility base:    Current seating system:                                                                        Age of seating system:    Describe posture in present seating system:    Is the current mobility meeting medical necessity?:  [] Yes [x] No Describe: Pt has difficulty with prolonged standing/walking due to significant LBP and neck pain from multiple surgeries.                                    Ability to complete Mobility-Related Activities of Daily Living (MRADL's) with Current Mobility Device:   Move room to room  [] Independent  [x] Min [] Mod [] Max assist  [] Unable  Comments:   Meal prep  [] Independent  [x] Min [] Mod [] Max assist  [] Unable    Feeding  [x] Independent  [] Min [] Mod [] Max assist  [] Unable    Bathing  [x] Independent  [] Min [] Mod [] Max assist  [] Unable    Grooming  [x] Independent  [] Min [] Mod [] Max assist  [] Unable    UE dressing  [x] Independent  [] Min [] Mod [] Max assist  [] Unable    LE dressing  [] Independent   [x] Min [] Mod [] Max assist  [] Unable    Toileting  [x] Independent  [] Min [] Mod [] Max assist  [] Unable    Bowel Mgt: [x]  Continent []  Incontinent []  Accidents []  Diapers []  Colostomy []  Bowel Program:  Bladder Mgt: [x]  Continent []  Incontinent []  Accidents []  Diapers []  Urinal []  Intermittent Cath []  Indwelling Cath []  Supra-pubic Cath     Current Mobility Equipment Trialed/ Ruled Out:    Does not meet mobility needs due to:    Oneil all boxes that indicate inability to use the specific equipment listed     Meets needs for safe  independent functional  ambulation  / mobility    Risk of  Falling or History of Falls    Enviromental limitations  Cognition    Safety concerns with  physical ability    Decreased / limitations endurance  & strength     Decreased / limitations  motor skills  & coordination    Pain    Pace /  Speed    Cardiac and/or  respiratory condition    Contra - indicated by diagnosis   Cane/Crutches  []   []   []   []   []   []   []   []   []   []   [x]    Walker / Rollator  []  NA   []   [x]   []   []   []   [x]   [x]   [x]    [x]   [x]   []     Manual Wheelchair [id number]:  [x]  NA  []   []   []   []   []   []   []   []   []   []   []    Manual W/C (K0005) with power assist  [x]  NA  []   []   []   []   []   []   []   []   []   []   []    Scooter  [x]  NA  []   []   []   []   []   []   []   []   []   []   []    Power Wheelchair: standard joystick  []  NA  [x]   []   []   []   []   []   []   []   []   []   []    Power Wheelchair: alternative controls  []  NA  []   []   []   []   []   []   []   []   []   []   []    Summary:  The least costly alternative for independent functional mobility was found to be:    []  Crutch/Cane  []  Walker []  Manual w/c  []  Manual w/c with power assist   []  Scooter   [x]  Power w/c std joystick   []  Power w/c alternative control        []  Requires dependent care mobility device   Cabin crew for [employer] skills are adequate for safe mobility equipment operation  [x]   Yes []   No  Patient is willing and motivated to use recommended mobility equipment  [x]   Yes []   No       []  Patient is unable to safely operate mobility equipment independently and requires dependent care equipment Comments:           SENSATION and SKIN ISSUES:  Sensation [x]  Intact  []  Impaired []  Absent []  Hyposensate []  Hypersensate  []  Defensiveness  Location(s) of impairment:    Pressure Relief Method(s):  [x]  Lean side to side to offload (without risk of falling)  []   W/C push up (4+ times/hour for 15+ seconds) [x]  Stand up (without risk of falling)    []  Other: (Describe): Effective pressure relief method(s) above can be performed consistently throughout the day: [x] Yes  []  No If not, Why?:  Skin Integrity Risk:       [x]  Low risk           []  Moderate risk            []  High risk  If high risk, explain:   Skin Issues/Skin Integrity  Current skin Issues  []  Yes [x]  No []  Intact  []   Red area   []   Open area  []  Scar tissue  []  At risk from prolonged sitting  Where: History of Skin Issues  []  Yes [x]  No Where  : When: Stage: Hx of skin flap surgeries  []   Yes [x]  No Where:  When:  Pain: [x]  Yes []  No   Pain Location(s): Low back pain, bil shoulders, bil radicular symptoms to knee Intensity scale: (0-10) : 9/10 at worst How does pain interfere with mobility and/or MRADLs? - difficulty with prolonged standing/walking        MAT EVALUATION:  Neuro-Muscular Status: (Tone, Reflexive, Responses, etc.)     [x]   Intact   []  Spasticity:  []  Hypotonicity  []  Fluctuating  []  Muscle Spasms  []  Poor Righting Reactions/Poor Equilibrium Reactions  []  Primal Reflex(s):    Comments:            COMMENTS:    POSTURE:     Comments:  Pelvis Anterior/Posterior:  [x]  Neutral   []  Posterior  []  Anterior  []  Fixed - No movement []  Tendency away from neutral []  Flexible []  Self-correction []  External correction Obliquity (viewed from front)  [x]  WFL []  R Obliquity []  L Obliquity  []  Fixed - No movement []  Tendency away from neutral []  Flexible []  Self-correction []  External correction Rotation  []  WFL []  R anterior []  L anterior  [x]  Fixed - No movement []  Tendency away from neutral []  Flexible []  Self-correction []  External correction Tonal Influence Pelvis:  [x]  Normal []  Flaccid []  Low tone []  Spasticity []  Dystonia []  Pelvis thrust []  Other:    Trunk Anterior/Posterior:  [x]  WFL []  Thoracic kyphosis []  Lumbar lordosis  []  Fixed - No movement []  Tendency away from neutral []  Flexible []  Self-correction []  External correction  [x]  WFL []  Convex to left  []  Convex to right []  S-curve   []  C-curve []  Multiple curves []  Tendency away from neutral []  Flexible []  Self-correction []  External correction Rotation of shoulders and upper trunk:  [x]  Neutral []  Left-anterior []  Right- anterior []  Fixed- no movement []  Tendency away from neutral []  Flexible []  Self correction []  External correction Tonal influence Trunk:  [x]  Normal []  Flaccid []  Low  tone []  Spasticity []  Dystonia []  Other:   Head & Neck  [x]  Functional []  Flexed    []  Extended []  Rotated right  []  Rotated left []  Laterally flexed right []  Laterally flexed left []  Cervical hyperextension   []  Good head control [x]  Adequate head control []  Limited head control []  Absent head control Describe tone/movement of head and neck:      Lower Extremity Measurements: LE ROM:  Active ROM Right 01/16/2024 Left 01/16/2024  Hip flexion    Hip extension    Hip abduction    Hip adduction    Knee flexion    Knee extension    Ankle dorsiflexion    Ankle plantarflexion     (Blank rows = not tested)  LE MMT:  MMT Right 01/16/2024 Left 01/16/2024  Hip flexion 2+ 2+  Hip extension    Hip abduction 3+ 3+  Hip adduction 3+ 3+  Knee flexion 4 4  Knee extension 4 4  Ankle dorsiflexion    Ankle plantarflexion     (Blank rows = not tested)  Hip positions:  [x]  Neutral   []  Abducted   []  Adducted  []  Subluxed   []  Dislocated   []  Fixed   []  Tendency away from neutral [x]  Flexible []  Self-correction []  External correction   Hip Windswept:[x]  Neutral  []  Right    []  Left  []  Subluxed   []  Dislocated   []  Fixed   []  Tendency away from neutral [x]  Flexible [x]  Self-correction []  External correction  LE Tone: [  x] Normal []  Low tone []  Spasticity []  Flaccid []  Dystonia []  Rocks/Extends at hip []  Thrust into knee extension []  Pushes legs downward into footrest  Foot positioning: ROM Concerns: Dorsiflexed: []  Right   []  Left Plantar flexed: []  Right    []  Left Inversion: []  Right    []  Left Eversion: []  Right    []  Left  LE Edema: []  1+ (Barely detectable impression when finger is pressed into skin) [x]  2+ (slight indentation. 15 seconds to rebound) []  3+ (deeper indentation. 30 seconds to rebound) []  4+ (>30 seconds to rebound)  UE Measurements:  UPPER EXTREMITY ROM:   Active ROM Right 01/16/2024 Left 01/16/2024  Shoulder flexion    Shoulder  abduction    Shoulder adduction    Elbow flexion    Elbow extension    Wrist flexion    Wrist extension    (Blank rows = not tested)  UPPER EXTREMITY MMT:  MMT Right 01/16/2024 Left 01/16/2024  Shoulder flexion 3+ 3+  Shoulder abduction 3+ 3+  Shoulder adduction    Elbow flexion 5 5  Elbow extension 5 5  Wrist flexion    Wrist extension    Pinch strength    Grip strength    (Blank rows = not tested)  Shoulder Posture:  Right Tendency towards Left  []   Functional []    [x]   Elevation [x]    []   Depression []    []   Protraction []    []   Retraction []    []   Internal rotation []    []   External rotation []    []   Subluxed []     UE Tone: [x]  Normal []  Flaccid []  Low tone []  Spasticity  []  Dystonia []  Other:   UE Edema: [x]  1+ (Barely detectable impression when finger is pressed into skin) []  2+ (slight indentation. 15 seconds to rebound) []  3+ (deeper indentation. 30 seconds to rebound) []  4+ (>30 seconds to rebound)  Wrist/Hand: Handedness: [x]  Right   []  Left   []  NA: Comments:  Right  Left  []   WNL []    []   Limitations []    []   Contractures []    []   Fisting []    []   Tremors []    []   Weak grasp []    []   Poor dexterity []    []   Hand movement non functional []    []   Paralysis []         MOBILITY BASE RECOMMENDATIONS and JUSTIFICATION:  MOBILITY BASE  JUSTIFICATION   Manufacturer:   Pride Model:                 Jazzy             Color:  Seat Width:  18 Seat Depth 18   []  Manual mobility base (continue below)   []  Scooter/POV  [x]  Power mobility base   Number of hours per day spent in above selected mobility base: 4-5  Typical daily mobility base use Schedule: intermittently throughout the day to manage fatigue and pain   []  is not a safe, functional ambulator  [x]  limitation prevents from completing a MRADL(s) within a reasonable time frame    [x]  limitation places at high risk of morbidity or mortality secondary to  the attempts to perform a     MRADL(s)  []  limitation prevents accomplishing a MRADL(s) entirely  [x]  provide independent mobility  [x]  equipment is a lifetime medical need  []  walker or cane inadequate  [x]  any type manual wheelchair  inadequate  [x]  scooter/POV inadequate      []  requires dependent mobility          MANUAL MOBILITY      []  Standard manual wheelchair  K0001      Arm:    []  both []  right  []  left      Foot:   []  both []  right   []  left  []  self-propels wheelchair  []  will use on regular basis  []  chair fits throughout home  []  willing and motivated to use  []  propels with assistance     []  dependent use   []  Standard hemi-manual wheelchair  K0002      Arm:    []  both []  right  []  left      Foot:   []  both []  right   []  left  []  lower seat height required to foot propel  []  short stature  []  self-propels wheelchair  []  will use on regular basis  []  chair fits throughout home  []  willing and motivated to use   []  propels with assistance  []  dependent use   []  Lightweight manual wheelchair  K0003      Arm:    []  both []  right  []  left      Foot:   []  both  []  right  []  left                   []  hemi height required  []  medical condition and weight of  wheelchair affect ability to self      propel standard manual wheelchair in the residence  []  can and does self-propel (marginal propulsion skills)  []  daily use _________hours  []  chair fits throughout home  []  willing and motivated to use  []  lower seat height required to foot propel  []  short stature   []  High strength lightweight manual  wheelchair (Breezy Ultra 4)  K0004     Arm:    []  both []  right  []  left     Foot:   []  both []  right   []  left                                                                  []  hemi height required []  medical condition and weight of wheelchair affect ability to self propel while engaging in frequent MRADL(s) that cannot be performed in a standard or lightweight manual wheelchair  []  daily  use _________hours  []  chair fits throughout home  []  willing and motivated to use  []  prevent repetitive use injuries   []  lower seat height required to foot propel  []  short stature    []  Ultra-lightweight manual wheelchair  K0005     Arm:    []  both []  right  []  left     Foot:   []  both []  right  []  left       []  hemi height required  []  heavy duty    Front seat to floor _____ inches      Rear seat to floor _____ inches      Back height _____ inches     Back angle ______ degrees      Front angle _____ degrees  []   full-time  manual wheelchair user  []  Requires individualized fitting and optimal adjustments for multiple features that include adjustable axle configuration, fully adjustable center of gravity, wheel camber, seat and back angle, angle of seat slope, which cannot be accommodated by a K0001 through K0004 manual wheelchair  []  prevent repetitive use injuries  []  daily use_________hours   []  user has high activity patterns that frequently require  them  to go out into the community for the purpose of independently accomplishing high level MRADL activities. Examples of these might include a combination of; shopping, work, school, Photographer, childcare, independently loading and unloading from a vehicle etc.  []  lower seat height required to foot propel  []  short stature  []  heavy duty -  weight over 250lbs   []  Current chair is a K0005   manufacture:___________________  model:_________________  serial#____________________  age:_________    []  First time X9994 user (complete trial)  K0004 time and # of strokes to propel 30 feet: ________seconds _________strokes  X9994 time and # of strokes to propel 30 feet: ________seconds _________strokes  What was the result of the trial between the K0004 and K0005 manual wheelchair? ___    What features of the K0005 w/c are needed as compared to the K0004 base? Why?___    []  adjustable seat and back angle changes the angle of  seat slope of the frame to attain a gravity assisted position for efficient propulsion and proper weight distribution along the frame     []  the front of the wheelchair will be configured higher than the back of the chair to allow gravity to assist the user with postural stability  []  the center of the wheel will be positioned for stability, safety and efficient propulsion  []  adjustable axle allows for vertical, horizontal, camber and overall width changes  throughout the wheels for adjustment of the client's exact needs and abilities.   []  adjustable axle increases the stability and function of the chair allowing for adjustment of the center of gravity.   []  accommodates the client's anatomical position in the chair maximizing independence in mobility and maneuverability in all environments.   []  create a minimal fixed tilt-in space to assist in positioning.   []  Describe users full-time manual wheelchair activity patterns:___    []  Power assist Comments:  []  prevent repetitive use injuries  []  repetitive strain injury present in    shoulder girdle    []  shoulder pain is (> or =) to 7/10     during manual propulsion       Current Pain _____/10  []  requires conservation of energy to participate in MRADL(s) runable to propel up ramps or curbs using manual wheelchair  []  been K0005 user greater than one year  []  user unwilling to use power      wheelchair (reason): []  less expensive option to power   wheelchair   []  rim activated power assist -      decreased strength   []  Heavy duty manual wheelchair       K0006     Arm:    []  both []  right  []  left     Foot:   []  both []  right  []  left     []  hemi height required    []  Dependent base  []  user exceeds 250lbs  []  non-functional ambulator    []  extreme spasticity  []  over active movement   []  broken frame/hx of repeated     repairs  []  able to self-propel  in residence       []  lower seat to floor height required  []  unable to  self-propel in residence   []  Extra heavy duty manual wheelchair  K0007     Arm:    []  both []  right  []  left     Foot:   []  both []  right  []  left     []  hemi height required  []  Dependent base  []  user exceeds 300lbs  []  non-functional ambulator    []  able to self-propel in residence   []  lower seat to floor height required  []  unable to self-propel in residence     []  Manual wheelchair with tilt [phone]      (Manual "Tilt-n-Space")  []  patient is dependent for transfers  []  patient requires frequent       positioning for pressure relief   []  patient requires frequent      positioning for poor/absent trunk control        []  Stroller Base  []  infant/child   []  unable to propel manual      wheelchair  []  allows for growth  []  non-functional ambulator  []  non-functional UE  []  independent mobility is not a goal at this time    MANUAL FRAME OPTIONS      Push handles  []  extended   []  angle adjustable   []  standard  []  caregiver access  []  caregiver assist    []  allows "hooking" to enable      increased ability to perform ADLs or maintain balance   []  Angle Adjustable Back  []  postural control  []  control of tone/spasticity  []  accommodation of range of motion  []  UE functional control  []  accommodation for seating system    Rear wheel placement  []  std/fixed  [] fully adjustableramputee   []  camber ________degree  []  removable rear wheel  []  non-removable rear wheel  Wheel size _______  Wheel style_______________________  []  improved UE access to wheels  []  increase propulsion ability  []  improved stability  []  changing angle in space for      improvement of postural stability  []  remove for transport    []  allow for seating system to fit on  base  []  amputee placement  []  1-arm drive access   r R  r L  []  enable propulsion of manual       wheelchair with one arm    []  amputee placement   Wheel rims/ Hand rims  []  Standard    []  Specialized-____ []  provide ability to  propel manual   []  increase self-propulsion with hand wheelchair weakness/decreased grasp     []  Spoke protector/guard   []  prevent hands from getting caught in spokes   Tires:  []  pneumatic  []  flat free inserts  []  solid  Style:  []  decrease roll resistance              []  prevent frequent flats  []  increase shock absorbency  []  decrease maintenance   []  decrease pain from road shock    []  decrease spasms from road shock    Wheel Locks:    []  push []  pull []  scissor  []  lock wheels for transfers  []  lock wheels from rolling   Brake/wheel lock extension:  []  R  []  L  []  allow user to operate wheel locks due to decreased reach or strength   Caster housing:  Caster size:  Style:                                          []  suspension fork  []  maneuverability   []  stability of wheelchair   []  durability  []  maintenance  []  angle adjustment for posture  []  allow for feet to come under        wheelchair base  []  allows change in seat to floor  height   []  increase shock absorbency  []  decrease pain from road shock  []  decrease spasms from road    shock   []  Side guards  []  prevent clothing getting caught in wheel or becoming soiled   [] provide hip and pelvic stability  []  eliminates contact between body and wheels  []  limit hand contact with wheels   []  Anti-tippers      []  prevent wheelchair from tipping    backward  []  assist caregiver with curbs     POWER MOBILITY      []  Scooter/POV    []  can safely operate   []  can safely transfer   []  has adequate trunk stability   []  cannot functionally propel  manual wheelchair    [x]  Power mobility base    []  non-ambulatory   [x]  cannot functionally propel manual wheelchair   [x]  cannot functionally and safely      operate scooter/POV  [x]  can safely operate power       wheelchair  [x]  home is accessible  [x]  willing to use power wheelchair     Tilt  []  Powered tilt on powered chair  []  Powered tilt on  manual chair  []  Manual tilt on manual chair Comments:  []  change position for pressure      []  elief/cannot weight shift   []  change position against      gravitational force on head and      shoulders   []  decrease pain  []  blood pressure management   []  control autonomic dysreflexia  []  decrease respiratory distress  []  management of spasticity  []  management of low tone  []  facilitate postural control   []  rest periods   []  control edema  []  increase sitting tolerance   []  aid with transfers     Recline   []  Power recline on power chair  []  Manual recline on manual chair  Comments:    []  intermittent catheterization  []  manage spasticity  []  accommodate femur to back angle  []  change position for pressure relief/cannot weight shift rhigh risk of pressure sore development  []  tilt alone does not accomplish     effective pressure relief, maximum pressure relief achieved at -      _______ degrees tilt   _______ degrees recline   []  difficult to transfer to and from bed []  rest periods and sleeping in chair  []  repositioning for transfers  []  bring to full recline for ADL care  []  clothing/diaper changes in chair  []  gravity PEG tube feeding  []  head positioning  []  decrease pain  []  blood pressure management   []  control autonomic dysreflexia  []  decrease respiratory distress  []  user on ventilator     Elevator on mobility base  []  Power wheelchair  []  Scooter  []  increase Indep in transfers   []  increase Indep in ADLs    []  bathroom function and safety  []   kitchen/cooking function and safety  []  shopping  []  raise height for communication at standing level  []  raise height for eye contact which reduces cervical neck strain and pain  []  drive at raised height for safety and navigating crowds  []  Other:   []  Vertical position system  (anterior tilt)     (Drive locks-out)    []  Stand       (Drive enabled)  []  independent weight bearing  []  decrease joint  contractures  []  decrease/manage spasticity  []  decrease/manage spasms  []  pressure distribution away from   scapula, sacrum, coccyx, and ischial tuberosity  []  increase digestion and elimination   []  access to counters and cabinets  []  increase reach  []  increase interaction with others at eye level, reduces neck strain  []  increase performance of       MRADL(s)      Power elevating legrest    []  Center mount (Single) 85-170 degrees       []  Standard (Pair) 100-170 degrees  []  position legs at 90 degrees, not available with std power ELR  []  center mount tucks into chair to decrease turning radius in home, not available with std power ELR  []  provide change in position for LE  []  elevate legs during recline    []  maintain placement of feet on      footplate  []  decrease edema  []  improve circulation  []  actuator needed to elevate legrest  []  actuator needed to articulate legrest preventing knees from flexing  []  Increase ground clearance over      curbs  []   STD (pair) independently                     elevate legrest   POWER WHEELCHAIR CONTROLS      Controls/input device  []  Expandable  [x]  Non-expandable  []  Proportional  [x]  Right Hand []  Left Hand  []  Non-proportional/switches/head-array  []  Electrical/proximity         []   Mechanical      Manufacturer:___________________   Type:________________________ [x]  provides access for controlling wheelchair  [x]  programming for accurate control  []  progressive disease/changing condition  []  required for alternative drive      controls       []  lacks motor control to operate  proportional drive control  []  unable to understand proportional controls  []  limited movement/strength  []  extraneous movement / tremors / ataxic / spastic       []  Upgraded electronics controller/harness    []  Single power (tilt or recline)   []  Expandable    []  Non-expandable plus   []  Multi-power (tilt, recline, power legrest, power seat lift,  vertical positioning system, stand)  []  allows input device to communicate with drive motors  []  harness provides necessary connections between the controller, input device, and seat functions     []  needed in order to operate power seat functions through joystick/ input device  []  required for alternative drive controls     []  Enhanced display  []  required to connect all alternative drive controls   []  required for upgraded joystick      (lite-throw, heavy duty, micro)  []  Allows user to see in which mode and drive the wheelchair is set; necessary for alternate controls       []  Upgraded tracking electronics  []  correct tracking when on uneven surfaces makes switch driving more efficient and less fatiguing  []  increase safety when driving  []   increase ability to traverse thresholds    []  Safety / reset / mode switches     Type:    []  Used to change modes and stop the wheelchair when driving     [x]  [facility] for joystick / input device/switches  [x]  swing away for access or transfers   [x]  attaches joystick / input device / switches to wheelchair   [x]  provides for consistent access  []  midline for optimal placement    []  Attendant controlled joystick plus     mount  []  safety  []  long distance driving  []  operation of seat functions  []  compliance with transportation regulations    [x]  Battery U1 x 2 [x]  required to power (power assist / scooter/ power wc / other):   []  Power inverter (24V to 12V)  []  required for ventilator / respiratory equipment / other:     CHAIR OPTIONS MANUAL & POWER      Armrests   [x]  adjustable height []  removable  []  swing away []  fixed  [x]  flip back  []  reclining  [x]  full length pads []  desk []  tube arms []  gel pads  [x]  provide support with elbow at 90    [x]  remove/flip back/swing away for  transfers  [x]  provide support and positioning of upper body    [x]  allow to come closer to table top  []  remove for access to tables  []  provide support for w/c  tray  []  change of height/angles for variable activities   []  Elbow support / Elbow stop  []  keep elbow positioned on arm pad  []  keep arms from falling off arm pad  during tilt and/or recline   Upper Extremity Support  []  Arm trough  []   R  []   L  Style:  []  swivel mount []  fixed mount   []  posterior hand support  []   tray  []  full tray  []  joystick cut out  []   R  []   L  Style:  []  decrease gravitational pull on      shoulders  []  provide support to increase UE  function  []  provide hand support in natural    position  []  position flaccid UE  []  decrease subluxation    []  decrease edema       []  manage spasticity   []  provide midline positioning  []  provide work surface  []  placement for AAC/ Computer/ EADL       Hangers/ Legrests   []  ______ degree  []  Elevating []  articulating  []  swing away []  fixed []  lift off  []  heavy duty  []  adjustable knee angle  []  adjustable calf panel   []  longer extension tube              []  provide LE support  []  maintain placement of feet on      footplate   []  accommodate lower leg length  []  accommodate to hamstring       tightness  []  enable transfers  []  provide change in position for LE's  []  elevate legs during recline    []  decrease edema  []  durability      Foot support   [x]  footplate []  R []  L [x]  flip up           []  Depth adjustable   []  angle adjustable  []  foot board/one piece    [x]  provide foot support  []  accommodate to ankle ROM  [x]  allow foot to go  under wheelchair base  [x]  enable transfers     []  Shoe holders  []  position foot    []  decrease / manage spasticity  []  control position of LE  []  stability    []  safety     []  Ankle strap/heel      loops  []  support foot on foot support  []  decrease extraneous movement  []  provide input to heel   []  protect foot     []  Amputee adapter []  R  []  L     Style:                  Size:  []  Provide support for stump/residual extremity    []  Transportation tie-down   []  to provide crash tested tie-down brackets    []  Crutch/cane holder    []  O2 holder    []  IV hanger   []  Ventilator tray/mount    []  stabilize accessory on wheelchair       Component  Justification     []  Seat cushion      []  accommodate impaired sensation  []  decubitus ulcers present or history  []  unable to shift weight  []  increase pressure distribution  []  prevent pelvic extension  []  custom required "off-the-shelf"    seat cushion will not accommodate deformity  []  stabilize/promote pelvis alignment  []  stabilize/promote femur alignment  []  accommodate obliquity  []  accommodate multiple deformity  []  incontinent/accidents  []  low maintenance     []  seat mounts                 []  fixed []  removable  []  attach seat platform/cushion to wheelchair frame    []  Seat wedge    []  provide increased aggressiveness of seat shape to decrease sliding  down in the seat  []  accommodate ROM        []  Cover replacement   []  protect back or seat cushion  []  incontinent/accidents    []  Solid seat / insert    []  support cushion to prevent      hammocking  []  allows attachment of cushion to mobility base    []  Lateral pelvic/thigh/hip     support (Guides)     []  decrease abduction  []  accommodate pelvis  []  position upper legs  []  accommodate spasticity  []  removable for transfers     []  Lateral pelvic/thigh      supports mounts  []  fixed   []  swing-away   []  removable  []  mounts lateral pelvic/thigh supports     []  mounts lateral pelvic/thigh supports swing-away or removable for transfers    []  Medial thigh support ([place])  [] decrease adduction  [] accommodate ROM  []  remove for transfers   []  alignment      []  Medial thigh   []  fixed      support mounts      []  swing-away   []  removable  []  mounts medial thigh supports   []  Mounts medial supports swing- away or removable for transfers       Component  Justification   [x]  Back       [x]  provide posterior trunk support []  facilitate  tone  [x]  provide lumbar/sacral support []  accommodate deformity  [x]  support trunk in midline   []  custom required "off-the-shelf" back support will not accommodate deformity   []  provide lateral trunk support []  accommodate or decrease tone            []  Back mounts  []   fixed  []  removable  []  attach back rest/cushion to wheelchair frame   []  Lateral trunk      supports  []  R []  L  []  decrease lateral trunk leaning  []  accommodate asymmetry    []  contour for increased contact  []  safety    []  control of tone    []  Lateral trunk      supports mounts  []  fixed  []  swing-away   []  removable  []  mounts lateral trunk supports     []  Mounts lateral trunk supports swing-away or removable for transfers   []  Anterior chest      strap, vest     []  decrease forward movement of shoulder  []  decrease forward movement of trunk  []  safety/stability  []  added abdominal support  []  trunk alignment  []  assistance with shoulder control   []  decrease shoulder elevation    []  Headrest      []  provide posterior head support  []  provide posterior neck support  []  provide lateral head support  []  provide anterior head support  []  support during tilt and recline  []  improve feeding     []  improve respiration  []  placement of switches  []  safety    []  accommodate ROM   []  accommodate tone  []  improve visual orientation   []  Headrest           []  fixed []  removable []  flip down      Mounting hardware   []  swing-away laterals/switches  []  mount headrest   []  mounts headrest flip down or  removable for transfers  []  mount headrest swing-away laterals   []  mount switches     []  Neck Support    []  decrease neck rotation  []  decrease forward neck flexion   Pelvic Positioner    [x]  std hip belt          []  padded hip belt  []  dual pull hip belt  []  four point hip belt  [x]  stabilize tone  [x]  decrease falling out of chair  []  prevent excessive extension  []  special pull angle to control       rotation  []  pad for protection over boney   prominence  []  promote comfort    []  Essential needs        bag/pouch   []  medicines []  special food rorthotics []  clothing changes  []  diapers  []  catheter/hygiene []  ostomy supplies   The above equipment has a life- long use expectancy.  Growth and changes in medical and/or functional conditions would be the exceptions.   SUMMARY:    ASSESSMENT:  Functional tests:  10 meter walk test: with Rolator: 0.34 m/s (<0.50 m/s is indicative of high fall risk)  Timed up and Go test: 34 sec with Rolator (high fall risk)  CLINICAL IMPRESSION: Patient is a 68 y.o. female who was seen today for physical therapy evaluation and treatment for wheelchair evaluation. Patient has PMH including DM, fibromyalgia, A-fib, HTN, macular degeneration and caracts. 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 Patient has bil UE weakness and pain which limits her from using manual wheelchair functionally within her home without exacerbating her symptoms. Based on 10 meter walk test and Timed up and Go test, patient is at high risk for fall and demo impaired functional mobility. Patient will benefit from Group 2  power chair to improve her ability to use power chair throughout the day intermittently to improve ability to functional throughout the day and manage her independence and manage her lower back pain.    OBJECTIVE IMPAIRMENTS Abnormal gait, decreased activity tolerance, decreased balance, decreased endurance, decreased mobility, difficulty walking, decreased ROM, decreased strength, hypomobility, increased edema, increased fascial restrictions, increased muscle spasms, impaired flexibility, postural dysfunction, and pain.   ACTIVITY LIMITATIONS carrying, lifting, bending, standing, squatting, stairs, transfers, dressing, reach over  head, and caring for others  PARTICIPATION LIMITATIONS: meal prep, cleaning, laundry, driving, shopping, and community activity  PERSONAL FACTORS Past/current experiences and 1-2 comorbidities: DM, hx of cervical and lumbar fusion are also affecting patient's functional outcome.   REHAB POTENTIAL: Good  CLINICAL DECISION MAKING: Stable/uncomplicated  EVALUATION COMPLEXITY: Low                                   GOALS: One time visit. No goals established.    PLAN: PT FREQUENCY: one time visit    Raj LOISE Blanch, PT 01/16/2024, 9:50 AM    I concur with the above findings and recommendations of the therapist:  Physician name printed:         Physician's signature:      Date:

## 2024-01-17 ENCOUNTER — Ambulatory Visit: Attending: Family Medicine

## 2024-01-17 ENCOUNTER — Other Ambulatory Visit: Payer: Self-pay

## 2024-01-17 DIAGNOSIS — R2689 Other abnormalities of gait and mobility: Secondary | ICD-10-CM | POA: Insufficient documentation

## 2024-01-17 DIAGNOSIS — M6281 Muscle weakness (generalized): Secondary | ICD-10-CM | POA: Insufficient documentation

## 2024-02-13 ENCOUNTER — Ambulatory Visit: Payer: Self-pay | Admitting: Family Medicine

## 2024-03-13 ENCOUNTER — Other Ambulatory Visit: Payer: Medicare HMO

## 2024-04-27 ENCOUNTER — Other Ambulatory Visit (HOSPITAL_BASED_OUTPATIENT_CLINIC_OR_DEPARTMENT_OTHER)

## 2024-04-27 ENCOUNTER — Encounter (HOSPITAL_BASED_OUTPATIENT_CLINIC_OR_DEPARTMENT_OTHER): Payer: Self-pay

## 2024-05-09 ENCOUNTER — Inpatient Hospital Stay (HOSPITAL_BASED_OUTPATIENT_CLINIC_OR_DEPARTMENT_OTHER): Admission: RE | Admit: 2024-05-09
